# Patient Record
Sex: Male | Born: 1950 | Race: White | Hispanic: No | Marital: Married | Smoking: Never smoker
Health system: Southern US, Community
[De-identification: ages and names within clinical notes are randomized; demographics above are authoritative.]

## PROBLEM LIST (undated history)

## (undated) DIAGNOSIS — M1990 Unspecified osteoarthritis, unspecified site: Secondary | ICD-10-CM

## (undated) DIAGNOSIS — F419 Anxiety disorder, unspecified: Secondary | ICD-10-CM

## (undated) DIAGNOSIS — Z8673 Personal history of transient ischemic attack (TIA), and cerebral infarction without residual deficits: Secondary | ICD-10-CM

## (undated) DIAGNOSIS — I4891 Unspecified atrial fibrillation: Secondary | ICD-10-CM

## (undated) DIAGNOSIS — Q211 Atrial septal defect: Secondary | ICD-10-CM

## (undated) DIAGNOSIS — G4730 Sleep apnea, unspecified: Secondary | ICD-10-CM

## (undated) DIAGNOSIS — R51 Headache: Secondary | ICD-10-CM

## (undated) DIAGNOSIS — G2581 Restless legs syndrome: Secondary | ICD-10-CM

## (undated) DIAGNOSIS — S065XAA Traumatic subdural hemorrhage with loss of consciousness status unknown, initial encounter: Secondary | ICD-10-CM

## (undated) DIAGNOSIS — Z87442 Personal history of urinary calculi: Secondary | ICD-10-CM

## (undated) DIAGNOSIS — R519 Headache, unspecified: Secondary | ICD-10-CM

## (undated) DIAGNOSIS — S060X9A Concussion with loss of consciousness of unspecified duration, initial encounter: Secondary | ICD-10-CM

## (undated) DIAGNOSIS — G459 Transient cerebral ischemic attack, unspecified: Secondary | ICD-10-CM

## (undated) DIAGNOSIS — G43909 Migraine, unspecified, not intractable, without status migrainosus: Secondary | ICD-10-CM

## (undated) DIAGNOSIS — K219 Gastro-esophageal reflux disease without esophagitis: Secondary | ICD-10-CM

## (undated) DIAGNOSIS — Q2112 Patent foramen ovale: Secondary | ICD-10-CM

## (undated) DIAGNOSIS — I499 Cardiac arrhythmia, unspecified: Secondary | ICD-10-CM

## (undated) HISTORY — DX: Unspecified atrial fibrillation: I48.91

## (undated) HISTORY — DX: Restless legs syndrome: G25.81

## (undated) HISTORY — DX: Migraine, unspecified, not intractable, without status migrainosus: G43.909

## (undated) HISTORY — DX: Concussion with loss of consciousness of unspecified duration, initial encounter: S06.0X9A

## (undated) HISTORY — PX: CARDIAC CATHETERIZATION: SHX172

## (undated) HISTORY — PX: TONSILLECTOMY: SUR1361

## (undated) HISTORY — PX: COLONOSCOPY: SHX174

---

## 1998-12-14 ENCOUNTER — Inpatient Hospital Stay (HOSPITAL_COMMUNITY): Admission: EM | Admit: 1998-12-14 | Discharge: 1998-12-17 | Payer: Self-pay | Admitting: Emergency Medicine

## 1998-12-14 ENCOUNTER — Encounter: Payer: Self-pay | Admitting: Neurology

## 1998-12-14 ENCOUNTER — Encounter: Payer: Self-pay | Admitting: Emergency Medicine

## 2003-06-07 ENCOUNTER — Encounter: Admission: RE | Admit: 2003-06-07 | Discharge: 2003-06-07 | Payer: Self-pay | Admitting: Family Medicine

## 2006-01-27 HISTORY — PX: FOOT ARTHROTOMY: SUR104

## 2006-07-21 ENCOUNTER — Ambulatory Visit (HOSPITAL_BASED_OUTPATIENT_CLINIC_OR_DEPARTMENT_OTHER): Admission: RE | Admit: 2006-07-21 | Discharge: 2006-07-21 | Payer: Self-pay | Admitting: Orthopedic Surgery

## 2006-12-04 ENCOUNTER — Inpatient Hospital Stay (HOSPITAL_COMMUNITY): Admission: AD | Admit: 2006-12-04 | Discharge: 2006-12-07 | Payer: Self-pay | Admitting: Cardiology

## 2007-01-28 HISTORY — PX: CARDIAC ELECTROPHYSIOLOGY STUDY AND ABLATION: SHX1294

## 2007-09-30 ENCOUNTER — Encounter: Admission: RE | Admit: 2007-09-30 | Discharge: 2007-09-30 | Payer: Self-pay | Admitting: Orthopedic Surgery

## 2008-01-05 ENCOUNTER — Encounter: Admission: RE | Admit: 2008-01-05 | Discharge: 2008-01-05 | Payer: Self-pay | Admitting: Orthopedic Surgery

## 2009-12-06 ENCOUNTER — Encounter
Admission: RE | Admit: 2009-12-06 | Discharge: 2010-01-17 | Payer: Self-pay | Source: Home / Self Care | Attending: Family Medicine | Admitting: Family Medicine

## 2010-01-27 HISTORY — PX: SHOULDER SURGERY: SHX246

## 2010-06-11 NOTE — H&P (Signed)
NAME:  Gregg Guerrero, Gregg Guerrero NO.:  [id number]   MEDICAL RECORD NO.:  [id number]          PATIENT TYPE:  INP   LOCATION:  3711                         FACILITY:  [facility]   PHYSICIAN:  Georga Hacking, M.D.DATE OF BIRTH:  1950/03/22   DATE OF ADMISSION:  12/04/2006  DATE OF DISCHARGE:                              HISTORY & PHYSICAL   REASON FOR ADMISSION:  To initiate medications for atrial fibrillation.   HISTORY:  The patient is a 60 year old male who has a somewhat complex  history.  He has a history of some chest discomfort in the past and had  a catheterization done in 1998 that showed no evidence of  atherosclerosis.  One week later, he had a TIA and was evaluated by the  neurologist.  In 2000, he had a TIA that was manifested mainly by  dysarthria and had a workup including a cerebral angiogram as well as  TEE that showed a patent foramen ovale.  He has been treated since that  time with aspirin and Plavix without recurrence.  In September of last  year, he developed increasing cardiac awareness where he complained of a  feeling of pressure in his head and felt as if his head was full and the  feeling would spread over his body.  He complained of episodic racing of  his heart beat and a general sensation of cardiac awareness with  pounding pulse, worse if he turned over on his left side.  He had been  able to exercise and had no exercise-induced symptoms.  He wore a  cardiac event monitor in January of this year with 34 transmissions.  He  was in sinus rhythm on most, with 1 atrial run of 7 beats and atrial  fibrillation noted on 1 day.  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  He also had asymmetric septal hypertrophy with normal left ventricular  function and Doppler evidence of decreased LV function in February of  this year.  He presented to the office complaining again of palpitations  and was quite anxious.  A previous exercise treadmill was normal.  He  continued under significant situational stress.  He was seen in August,  at which time he had recurrent palpitations and dizziness.  We had  recommended him having an implantable loop monitor, but he was not  willing to have this done, but instead wore another event monitor that  was done in August and showed 40 episodes with frequent PACs and  sporadic bursts of atrial fibrillation and atrial flutter seen on  multiple days during the monitoring.Marland Kitchen  He was seen in September and was  placed on treatment with extended-release metoprolol 25 mg b.i.d. and  Rythmol SR 225 mg b.i.d.  He was also given an appointment see Dr. Clydie Braun.  He had excessive sleepiness, fatigue and lack of energy and  felt that his head was feeling full.  Metoprolol was discontinued and he  continued to complain of excessive sleepiness.  He was quite anxious  about his overall condition.  He was changed to flecainide 50 mg b.i.d.  and had seen Dr. Clydie Braun, who had recommended Coumadin therapy  and also talked about having a catheter ablation for the atrial  fibrillation.  He was seen in the office earlier this week complaining  of a feeling of head fullness, feeling bad like he was drunk with no  energy and pressure in his head and face.  At that point, flecainide was  discontinued and he was given a p.r.n. prescription for Cardizem to use  and is admitted to this time to initiate therapy with Tikosyn.   PAST MEDICAL HISTORY:  Remarkable for TIAs previously and the previous  patent foramen ovale.  He has no previous history of hypertension or  diabetes and no definite hyperlipidemia.   PREVIOUS SURGERIES:  Tonsillectomy.   ALLERGIES:  None.   CURRENT MEDICATIONS:  1. Ambien CR 12.5 nightly.  2. Aspirin 81 mg daily.  3. Fish oil daily.  4. Multivitamins daily.  5. Plavix 75 mg daily.   FAMILY HISTORY:  His father died of a  history of heart failure to age  44.  Mother died of a stroke at age 44.  Brother died of brain cancer at  age 82.  Another brother died of a history of heart failure and lung  cancer at age 38.  A sister died of pneumonia.  One brother is 57 and is  alive and well.   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  He attends [employer].  He and his wife are married.  He has 1 son who was adopted and has been a significant source of  situational stress.  He has a remote history of smoking.   REVIEW OF SYSTEMS:  Anxiety has been a significant problem for him and  he is quite stressed.  He has complained of significant malaise and  fatigue.  He has no skin problems.  He has diplopia, glaucoma or visual  field, no eye, ear, noxious stimuli or throat problems.  Normally, he  has no dyspnea, cough or wheezing.  He has no ulcers, hematochezia or GI  bleeding.  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  He has a history of migraine headaches and has a  history of a TIA previously and has seen the neurologist.  He has been  under significant situational stress and struggles with anxiety and  depression.   EXAMINATION:  GENERAL:  He is a pleasant, middle-aged male in no acute  distress, who appears anxious.  VITAL SIGNS:  Blood pressure was 110/60.  Weight was 181.  Pulse was 80.  Skin was warm and dry.  ENT:  EOMI.  PERLA.  C&S clear.  Fundi not examined.  Pharynx negative.  NECK:  Supple without masses, JVD, thyromegaly or bruits.  LUNGS:  Clear to A&P.  CARDIAC:  Normal S1 and S2, no S3 or murmur.  ABDOMEN:  Soft and nontender.  EXTREMITIES:  Femoral and distal pulses are 2+.   TWELVE-LEAD EKG:  Within normal limits.  Left axis deviation is noted.   IMPRESSION:  1. Paroxysmal atrial fibrillation.  2. Intolerance to flecainide and Rythmol and metoprolol.  3. History of transient ischemic attack and history  of migraine      headaches.  4. History of malaise and fatigue, probably due to medications.   RECOMMENDATIONS:  The patient has significant paroxysmal atrial  fibrillation.  He is considering having a catheter ablation and has been  intolerant to antiarrhythmic medicines already.  He was initiated for  inpatient monitoring of Tikosyn.  We previously have discussed with him  recommendations for Coumadin therapy with  paroxysmal atrial fibrillation and a previous history of TIAs, but he is  hesitant to do this because of concerns about the risks of being on  Coumadin.  We will initiate therapy with Tikosyn, continue aspirin and  Plavix and he will be monitored closely.      Georga Hacking, M.D.  Electronically Signed     WST/MEDQ  D:  12/04/2006  T:  12/05/2006  Job:  [id number]   cc:   Sigmund Hazel, M.D.

## 2010-06-11 NOTE — Op Note (Signed)
NAME:  Gregg Guerrero, Gregg Guerrero             ACCOUNT NO.:  [id number]   MEDICAL RECORD NO.:  [id number]          PATIENT TYPE:  AMB   LOCATION:  DSC                          FACILITY:  [facility]   PHYSICIAN:  Leonides Grills, M.D.     DATE OF BIRTH:  1950-12-19   DATE OF PROCEDURE:  07/21/2006  DATE OF DISCHARGE:                               OPERATIVE REPORT   PREOPERATIVE DIAGNOSIS:  Right hallux rigidus.   POSTOPERATIVE DIAGNOSIS:  Right hallux rigidus.   OPERATION:  Right great toe cheilectomy.   ANESTHESIA:  General with block.   SURGEON:  Leonides Grills, MD   ASSISTANT:  Evlyn Kanner, P.A.   ESTIMATED BLOOD LOSS:  Minimal.   TOURNIQUET TIME:  Approximately half hour.   COMPLICATIONS:  None.   DISPOSITION:  Stable to PR.   INDICATIONS:  60 year old male who has had longstanding right dorsal  great toe pain that is interfering with his life to the point that he  can not do what he wants to do.  He has consented to procedure.  All  risks infection or vessel injury, persistent worse pain, prolonged  recovery, stiffness, arthritis and possible fusion versus  hemiarthroplasty were all explained.  Questions were encouraged and  answered.   OPERATION:  The patient brought to the operating room, placed in supine  position after adequate general tube anesthesia was administered with  block as well as Ancef 1 gram IV piggyback.  The right lower extremity  was prepped and draped in sterile manner over a proximally placed thigh  tourniquet.  Limb was gravity exsanguinated.  Tourniquet elevated to 290  mmHg, longitudinal incision just medial to the EHL tendon was then made.  Dissection was carried down through skin.  Hemostasis was obtained.  A  longitudinal capsulotomy was then made in the right great toe MTP joint  medial to the EHL tendon.  EHL tendon was not violated.  Once the  capsulotomy was made, this capsule was then elevated both medially and  laterally.  This exposed the large  osteophyte dorsally.  This was then  removed with the sagittal saw.  Both the medial lateral gutters were  also debrided as well with a rongeur.  There was a loose body in the  dorsolateral aspect that was removed as well that was the size of a pea.  The spur on the base of the proximal phalanx was also removed with  rongeur.  The area and joint were copiously irrigated with normal  saline.  About 2/3 of the joint surface had remaining cartilage, another  third mostly laterally had complete loss of cartilage.  We then placed  in the McGlamry elevator to elevate plantar to improve the range of  motion.  Range of motion of the tendon was excellent, 45-50 degrees of  dorsiflexion and about 20 degrees of plantar flexion.  Once again the  area was copiously irrigated normal saline.  A bone wax applied to all  exposed bony surfaces.  Tourniquet deflated.  Hemostasis was obtained.  Capsule was closed with 3-0 Vicryl stitch.  Area was copiously irrigated  once again with  normal saline.  Subcu was closed 3-0 Vicryl.  Skin was  closed 4-0 nylon.  Sterile dressing was applied.  Hard sole shoe was  applied.  Patient stable to PR.   POSTOP COURSE:  The patient follow-up in 2 weeks.  At that time remove  the dressing as well as suture.  He can go into a normal shoe at that  time. Skin mobilization to start at that time as well and active-active,  passive range of motion excise great toe MTP joint is then to proceed.  Ice and elevation are encouraged.      Leonides Grills, M.D.  Electronically Signed     PB/MEDQ  D:  07/21/2006  T:  07/21/2006  Job:  [id number]   cc:   Sigmund Hazel, M.D.  Deanna Artis. Sharene Skeans, M.D.

## 2010-06-11 NOTE — Discharge Summary (Signed)
NAME:  Gregg Guerrero, Gregg Guerrero NO.:  [id number]   MEDICAL RECORD NO.:  [id number]           PATIENT TYPE:   LOCATION:                                 FACILITY:   PHYSICIAN:  Georga Hacking, M.D.DATE OF BIRTH:  [birth year]   DATE OF ADMISSION:  11/03/2006  DATE OF DISCHARGE:  11/06/2006                               DISCHARGE SUMMARY   FINAL DIAGNOSES:  1. Paroxysmal atrial fibrillation, a Tikosyn successfully initiated      this admission.  2. History of premature atrial contractions.  3. Head fullness of uncertain etiology, not cardiac related.  4. Anxiety.  5. Previous patent foraminal ovale.   HISTORY:  This 60 year old male has had a remote history of TIAs.  He  has also had increasing problems with anxiety, but this year has had  significant symptomatic paroxysmal atrial fibrillation that has been  documented.  He was tried initially on Rythmol and metoprolol that  caused excessive malaise and sleepiness.  He was later changed to  flecainide but he also complained of feeling fatigued and sleepy on  this.  He is being considered to have an ablation for atrial  fibrillation, but this to be several months off, and he is still  undecided about it.  He was concerned about side effects from the other  medication, and was recommended to initiate Tikosyn therapy to see if  this would help symptoms.  Please see the previously dictated history  and physical for remainder of the details.   HOSPITAL COURSE:  Laboratory data shows a sodium of 138, potassium 4.2,  chloride 107, CO2 25, BUN 24, creatinine 1.34, glucose 91.  CBC was  normal PT and PTT were normal.  Liver enzymes were normal.  TSH was  0.908.  Magnesium level was 2.3.  EKG was normal with a QTc of 0.41.   The patient was admitted to initiate Tikosyn therapy under monitoring.  He tolerated this well without any QT prolongation.  He was given  instruction and patient education regarding Tikosyn.  He continued  to  have symptoms of had fullness, and we discussed this, and it was felt  that these were not related is he was not having any arrhythmia at the  present time of the symptoms.  He is discharged at this time in improved  condition on:  1. Aspirin 325 mg daily.  2. Plavix 75 mg daily.  3. Tikosyn 250 mg q.12 hours.  4. Fish oil 1 g twice a day.   He was given instructions to avoid cimetidine, Septra and other sulfa-  related medications, agents to treat fungus as well as other medicines  to potentially avoid.  He is to follow up with Dr. Clydie Braun at  [facility] for consideration of whether he wants to  have an ablation or not.  He may wear a continuous loop telemetry  monitor to assess efficacy and suppression of atrial fibrillation.  He  is to call if there are further problems.      Georga Hacking, M.D.  Electronically Signed     WST/MEDQ  D:  12/07/2006  T:  12/07/2006  Job:  [id number]   cc:   Deanna Artis. Sharene Skeans, M.D.

## 2010-06-14 NOTE — H&P (Signed)
[place]  Patient:    Gregg Guerrero                     MRN: [id number] Adm. Date:  [id number] Attending:  Glean Hess D                         History and Physical  CHIEF COMPLAINT:  Unable to speak and right arm weakness.  HISTORY OF PRESENT ILLNESS:  The patient is a 60 year old man who woke up early  this morning in his usual state of health.  Around 6:15 in the morning right before breakfast he developed a sudden onset of right upper extremity weakness, incoordination and he was unable to talk for about 20 minutes.  The patient was  transported to the emergency room by his wife in a private vehicle.  By the time of arrival he showed significant improvement of his symptoms and he was back to his baseline neurological status.  This patient had a prior history of TIA in 1998 hen he underwent an extensive workup and testing under evaluation by Dr. Ellison Carwin from [facility] which included MRI/MRA of the brain, hypercoaguable state labs, and cardiac evaluation which consisted of a coronary  angiogram prior to his admission in 1998 by cardiology.  All of his work ______  and testing was completely normal.  He has been kept on aspirin 325 mg p.o. q.d. for secondary stroke prevention and since then he has been doing very well.  PAST MEDICAL HISTORY: 1. TIA - unknown etiology. 2. Low HDL. 3. Periodic limb movement syndrome. 4. Mild central obstructive apnea. 5. Migraine headaches.  MEDICATIONS: 1. Zoloft 50 mg q.d. 2. Prevacid 30 mg p.r.n. 3. Aspirin 325 mg q.d.  ALLERGIES:  No drug allergies.  SOCIAL HISTORY:  LIves with his wife, nonsmoker, nondrinker.  PRIMARY CARE PHYSICIAN:  Dellis Anes. Idell Pickles, M.D. at [facility] [phone].  REVIEW OF SYSTEMS:  As in prior history and present illness.  PHYSICAL EXAMINATION:  VITAL SIGNS:  Blood pressure 126/68, pulse 78, respirations 15,  temperature 98.6.  GENERAL:   Well-developed man laying in the stretcher in no acute distress. Head normocephalic, atraumatic.  NECK:  Supple, no bruits.  LUNGS:  Clear bilaterally.  HEART:  Sounds are regular rhythm, no murmurs.  ABDOMEN: Soft, bowel sounds present.  No visceromegaly, no tenderness.  EXTREMITIES:  Positive pulses bilaterally.  No cyanosis or edema.  NEUROLOGIC:  Awake, alert, oriented.  Speech fluent.  Memory and language normal. Cranial nerves II-XII:  Pupils equal, round and reactive to light and accommodation. Extraocular cephalic movement intact.  Face:  Symmetric. Tongue: Midline.  Palate elevates symmetrically.  Visual fields:  Full to confrontation. Hearing:  Intact.  Motor examination:  Strength full bilaterally 5/5 upper and lower extremities.  Deep tendon reflexes +2 throughout.  Plantar is downgoing.  Coordination and sensory normal.  IMPRESSION:  Left hemispheric transient ischemic attack, unknown etiology.  PLAN AND RECOMMENDATIONS:  The diagnosis, condition, and further intervention were discussed at length with the patient and his wife at the bedside.  We would like to admit the patient for further workup and testing of transient ischemic attack of unknown etiology at this time.  Further testing that is warranted in order to prevent a potential stroke in the future would include a transesophageal echocardiogram for evaluation of potential embolic sources including PFO, atrial septal aneurysm, left atrial appendage clot  and aortic arch abnormalities.  We would also like to obtain an activated protein C resistant testing, homocystine  level, and a cerebral catheterization and angiography to evaluate for vasculitis. The patient will remain on heparin until his workup is completed.  Maintain PTTs between 60 and 80 seconds.  Dr. Toya Smothers office at [facility] was notified of the patients admission.  Thank you for allowing me  to participate in the care of this patient. DD:  12/14/98 TD:  12/14/98 Job: [id number]

## 2010-11-05 LAB — COMPREHENSIVE METABOLIC PANEL
ALT: 25
AST: 21
Albumin: 3.7
BUN: 24 — ABNORMAL HIGH
CO2: 25
Creatinine, Ser: 1.34
Glucose, Bld: 91
Total Bilirubin: 0.8
Total Protein: 6.2

## 2010-11-05 LAB — CBC
Hemoglobin: 14.8
MCV: 88

## 2010-11-05 LAB — MAGNESIUM: Magnesium: 2.3

## 2010-11-05 LAB — DIFFERENTIAL
Eosinophils Absolute: 0.1
Lymphs Abs: 1.7
Neutro Abs: 4
Neutrophils Relative %: 63

## 2010-11-05 LAB — PROTIME-INR: Prothrombin Time: 13.2

## 2013-01-31 ENCOUNTER — Encounter (HOSPITAL_BASED_OUTPATIENT_CLINIC_OR_DEPARTMENT_OTHER): Payer: Self-pay | Admitting: *Deleted

## 2013-01-31 NOTE — Progress Notes (Signed)
No labs needed

## 2013-02-02 ENCOUNTER — Other Ambulatory Visit: Payer: Self-pay | Admitting: Orthopedic Surgery

## 2013-02-02 ENCOUNTER — Encounter (HOSPITAL_BASED_OUTPATIENT_CLINIC_OR_DEPARTMENT_OTHER): Payer: Self-pay | Admitting: *Deleted

## 2013-02-03 ENCOUNTER — Encounter (HOSPITAL_BASED_OUTPATIENT_CLINIC_OR_DEPARTMENT_OTHER): Payer: 59 | Admitting: Certified Registered"

## 2013-02-03 ENCOUNTER — Encounter (HOSPITAL_BASED_OUTPATIENT_CLINIC_OR_DEPARTMENT_OTHER)
Admission: RE | Disposition: A | Discharge status: Home / Self Care | Payer: Self-pay | Source: Ambulatory Visit | Attending: Orthopedic Surgery

## 2013-02-03 ENCOUNTER — Ambulatory Visit (HOSPITAL_BASED_OUTPATIENT_CLINIC_OR_DEPARTMENT_OTHER)
Admission: RE | Admit: 2013-02-03 | Discharge: 2013-02-03 | Disposition: A | Discharge status: Home / Self Care | Payer: 59 | Source: Ambulatory Visit | Attending: Orthopedic Surgery | Admitting: Orthopedic Surgery

## 2013-02-03 ENCOUNTER — Ambulatory Visit (HOSPITAL_BASED_OUTPATIENT_CLINIC_OR_DEPARTMENT_OTHER): Payer: 59 | Admitting: Certified Registered"

## 2013-02-03 ENCOUNTER — Encounter (HOSPITAL_BASED_OUTPATIENT_CLINIC_OR_DEPARTMENT_OTHER): Payer: Self-pay | Admitting: *Deleted

## 2013-02-03 DIAGNOSIS — M2020 Hallux rigidus, unspecified foot: Secondary | ICD-10-CM | POA: Insufficient documentation

## 2013-02-03 DIAGNOSIS — Z7982 Long term (current) use of aspirin: Secondary | ICD-10-CM | POA: Insufficient documentation

## 2013-02-03 DIAGNOSIS — M2022 Hallux rigidus, left foot: Secondary | ICD-10-CM

## 2013-02-03 DIAGNOSIS — M2010 Hallux valgus (acquired), unspecified foot: Secondary | ICD-10-CM | POA: Insufficient documentation

## 2013-02-03 HISTORY — DX: Sleep apnea, unspecified: G47.30

## 2013-02-03 HISTORY — PX: CHEILECTOMY: SHX1336

## 2013-02-03 HISTORY — DX: Anxiety disorder, unspecified: F41.9

## 2013-02-03 HISTORY — DX: Transient cerebral ischemic attack, unspecified: G45.9

## 2013-02-03 HISTORY — DX: Cardiac arrhythmia, unspecified: I49.9

## 2013-02-03 LAB — POCT HEMOGLOBIN-HEMACUE: HEMOGLOBIN: 12.2 g/dL — AB (ref 13.0–17.0)

## 2013-02-03 SURGERY — CHEILECTOMY
Anesthesia: General | Site: Foot | Laterality: Left

## 2013-02-03 MED ORDER — ONDANSETRON HCL 4 MG/2ML IJ SOLN
INTRAMUSCULAR | Status: DC | PRN
  Administered 2013-02-03: 4 mg via INTRAVENOUS

## 2013-02-03 MED ORDER — MIDAZOLAM HCL 2 MG/2ML IJ SOLN
INTRAMUSCULAR | Status: AC
  Filled 2013-02-03: qty 2

## 2013-02-03 MED ORDER — 0.9 % SODIUM CHLORIDE (POUR BTL) OPTIME
TOPICAL | Status: DC | PRN
  Administered 2013-02-03: 200 mL

## 2013-02-03 MED ORDER — CHLORHEXIDINE GLUCONATE 4 % EX LIQD
60.0000 mL | Freq: Once | CUTANEOUS | Status: AC
  Administered 2013-02-03: 4 via TOPICAL

## 2013-02-03 MED ORDER — ONDANSETRON HCL 4 MG/2ML IJ SOLN: 4.0000 mg | Freq: Once | INTRAMUSCULAR | Status: DC | PRN

## 2013-02-03 MED ORDER — HYDROMORPHONE HCL PF 1 MG/ML IJ SOLN: 0.2500 mg | INTRAMUSCULAR | Status: DC | PRN

## 2013-02-03 MED ORDER — FENTANYL CITRATE 0.05 MG/ML IJ SOLN
INTRAMUSCULAR | Status: AC
  Filled 2013-02-03: qty 2

## 2013-02-03 MED ORDER — BACITRACIN ZINC 500 UNIT/GM EX OINT
TOPICAL_OINTMENT | CUTANEOUS | Status: AC
  Filled 2013-02-03: qty 28.35

## 2013-02-03 MED ORDER — OXYCODONE HCL 5 MG/5ML PO SOLN: 5.0000 mg | Freq: Once | ORAL | Status: DC | PRN

## 2013-02-03 MED ORDER — OXYCODONE HCL 5 MG PO TABS
5.0000 mg | ORAL_TABLET | Freq: Once | ORAL | Status: DC | PRN
Start: 2013-02-03 — End: 2013-02-03

## 2013-02-03 MED ORDER — CEFAZOLIN SODIUM-DEXTROSE 2-3 GM-% IV SOLR
2.0000 g | INTRAVENOUS | Status: AC
  Administered 2013-02-03: 2 g via INTRAVENOUS

## 2013-02-03 MED ORDER — LACTATED RINGERS IV SOLN
INTRAVENOUS | Status: DC
  Administered 2013-02-03 (×2): via INTRAVENOUS

## 2013-02-03 MED ORDER — BACITRACIN ZINC 500 UNIT/GM EX OINT
TOPICAL_OINTMENT | CUTANEOUS | Status: DC | PRN
  Administered 2013-02-03: 1 via TOPICAL

## 2013-02-03 MED ORDER — OXYCODONE HCL 5 MG PO TABS: 5.0000 mg | ORAL_TABLET | ORAL | Status: DC | PRN

## 2013-02-03 MED ORDER — CEFAZOLIN SODIUM 1-5 GM-% IV SOLN
INTRAVENOUS | Status: AC
  Filled 2013-02-03: qty 100

## 2013-02-03 MED ORDER — BUPIVACAINE-EPINEPHRINE PF 0.5-1:200000 % IJ SOLN
INTRAMUSCULAR | Status: AC
  Filled 2013-02-03: qty 30

## 2013-02-03 MED ORDER — FENTANYL CITRATE 0.05 MG/ML IJ SOLN
INTRAMUSCULAR | Status: AC
  Filled 2013-02-03: qty 6

## 2013-02-03 MED ORDER — SODIUM CHLORIDE 0.9 % IV SOLN: INTRAVENOUS | Status: DC

## 2013-02-03 MED ORDER — DEXAMETHASONE SODIUM PHOSPHATE 10 MG/ML IJ SOLN
INTRAMUSCULAR | Status: DC | PRN
  Administered 2013-02-03: 10 mg via INTRAVENOUS

## 2013-02-03 MED ORDER — FENTANYL CITRATE 0.05 MG/ML IJ SOLN
50.0000 ug | INTRAMUSCULAR | Status: DC | PRN
  Administered 2013-02-03: 100 ug via INTRAVENOUS

## 2013-02-03 MED ORDER — MIDAZOLAM HCL 2 MG/2ML IJ SOLN
1.0000 mg | INTRAMUSCULAR | Status: DC | PRN
  Administered 2013-02-03: 2 mg via INTRAVENOUS

## 2013-02-03 MED ORDER — LIDOCAINE HCL (CARDIAC) 20 MG/ML IV SOLN
INTRAVENOUS | Status: DC | PRN
  Administered 2013-02-03: 30 mg via INTRAVENOUS

## 2013-02-03 MED ORDER — PROPOFOL 10 MG/ML IV BOLUS
INTRAVENOUS | Status: DC | PRN
  Administered 2013-02-03: 200 mg via INTRAVENOUS

## 2013-02-03 MED ORDER — BUPIVACAINE-EPINEPHRINE PF 0.5-1:200000 % IJ SOLN
INTRAMUSCULAR | Status: DC | PRN
  Administered 2013-02-03: 2 mL via PERINEURAL

## 2013-02-03 MED ORDER — OXYCODONE HCL 5 MG PO TABS: 5.0000 mg | ORAL_TABLET | Freq: Once | ORAL | Status: DC | PRN

## 2013-02-03 SURGICAL SUPPLY — 69 items
BANDAGE ESMARK 6X9 LF (GAUZE/BANDAGES/DRESSINGS) IMPLANT
BLADE AVERAGE 25X9 (BLADE) ×2 IMPLANT
BLADE MINI RND TIP GREEN BEAV (BLADE) IMPLANT
BLADE OSC/SAG .038X5.5 CUT EDG (BLADE) IMPLANT
BLADE SURG 15 STRL LF DISP TIS (BLADE) ×2 IMPLANT
BLADE SURG 15 STRL SS (BLADE) ×6
BNDG CMPR 9X4 STRL LF SNTH (GAUZE/BANDAGES/DRESSINGS)
BNDG CMPR 9X6 STRL LF SNTH (GAUZE/BANDAGES/DRESSINGS) ×1
BNDG COHESIVE 4X5 TAN STRL (GAUZE/BANDAGES/DRESSINGS) ×2 IMPLANT
BNDG CONFORM 2 STRL LF (GAUZE/BANDAGES/DRESSINGS) IMPLANT
BNDG CONFORM 3 STRL LF (GAUZE/BANDAGES/DRESSINGS) ×2 IMPLANT
BNDG ESMARK 4X9 LF (GAUZE/BANDAGES/DRESSINGS) IMPLANT
BNDG ESMARK 6X9 LF (GAUZE/BANDAGES/DRESSINGS) ×2
CHLORAPREP W/TINT 26ML (MISCELLANEOUS) ×2 IMPLANT
COVER TABLE BACK 60X90 (DRAPES) ×2 IMPLANT
CUFF TOURNIQUET SINGLE 34IN LL (TOURNIQUET CUFF) ×1 IMPLANT
DRAPE EXTREMITY T 121X128X90 (DRAPE) ×2 IMPLANT
DRAPE OEC MINIVIEW 54X84 (DRAPES) ×1 IMPLANT
DRAPE U-SHAPE 47X51 STRL (DRAPES) ×2 IMPLANT
DRSG EMULSION OIL 3X3 NADH (GAUZE/BANDAGES/DRESSINGS) ×2 IMPLANT
ELECT REM PT RETURN 9FT ADLT (ELECTROSURGICAL) ×2
ELECTRODE REM PT RTRN 9FT ADLT (ELECTROSURGICAL) ×1 IMPLANT
GLOVE BIO SURGEON STRL SZ8 (GLOVE) ×2 IMPLANT
GLOVE BIOGEL PI IND STRL 7.0 (GLOVE) IMPLANT
GLOVE BIOGEL PI IND STRL 7.5 (GLOVE) ×1 IMPLANT
GLOVE BIOGEL PI IND STRL 8 (GLOVE) ×1 IMPLANT
GLOVE BIOGEL PI INDICATOR 7.0 (GLOVE) ×1
GLOVE BIOGEL PI INDICATOR 7.5 (GLOVE) ×1
GLOVE BIOGEL PI INDICATOR 8 (GLOVE) ×1
GLOVE ECLIPSE 6.5 STRL STRAW (GLOVE) ×1 IMPLANT
GLOVE ECLIPSE 7.0 STRL STRAW (GLOVE) ×1 IMPLANT
GLOVE EXAM NITRILE MD LF STRL (GLOVE) ×1 IMPLANT
GOWN STRL REUS W/ TWL LRG LVL3 (GOWN DISPOSABLE) ×2 IMPLANT
GOWN STRL REUS W/ TWL XL LVL3 (GOWN DISPOSABLE) ×1 IMPLANT
GOWN STRL REUS W/TWL LRG LVL3 (GOWN DISPOSABLE) ×2
GOWN STRL REUS W/TWL XL LVL3 (GOWN DISPOSABLE) ×2
NDL HYPO 25X1 1.5 SAFETY (NEEDLE) IMPLANT
NEEDLE HYPO 25X1 1.5 SAFETY (NEEDLE) IMPLANT
NS IRRIG 1000ML POUR BTL (IV SOLUTION) ×2 IMPLANT
PACK BASIN DAY SURGERY FS (CUSTOM PROCEDURE TRAY) ×2 IMPLANT
PAD ABD 8X10 STRL (GAUZE/BANDAGES/DRESSINGS) ×2 IMPLANT
PAD CAST 4YDX4 CTTN HI CHSV (CAST SUPPLIES) ×1 IMPLANT
PADDING CAST ABS 4INX4YD NS (CAST SUPPLIES)
PADDING CAST ABS COTTON 4X4 ST (CAST SUPPLIES) ×1 IMPLANT
PADDING CAST COTTON 4X4 STRL (CAST SUPPLIES) ×2
PENCIL BUTTON HOLSTER BLD 10FT (ELECTRODE) ×2 IMPLANT
SANITIZER HAND PURELL 535ML FO (MISCELLANEOUS) ×2 IMPLANT
SHEET MEDIUM DRAPE 40X70 STRL (DRAPES) ×1 IMPLANT
SLEEVE SCD COMPRESS KNEE MED (MISCELLANEOUS) ×2 IMPLANT
SPONGE GAUZE 4X4 12PLY (GAUZE/BANDAGES/DRESSINGS) ×3 IMPLANT
SPONGE LAP 18X18 X RAY DECT (DISPOSABLE) ×2 IMPLANT
STOCKINETTE 6  STRL (DRAPES) ×1
STOCKINETTE 6 STRL (DRAPES) ×1 IMPLANT
SUCTION FRAZIER TIP 10 FR DISP (SUCTIONS) IMPLANT
SUT ETHILON 3 0 PS 1 (SUTURE) ×2 IMPLANT
SUT ETHILON 4 0 PS 2 18 (SUTURE) IMPLANT
SUT MNCRL AB 3-0 PS2 18 (SUTURE) ×2 IMPLANT
SUT MNCRL AB 4-0 PS2 18 (SUTURE) IMPLANT
SUT VIC AB 2-0 SH 27 (SUTURE) ×2
SUT VIC AB 2-0 SH 27XBRD (SUTURE) ×1 IMPLANT
SUT VIC AB 3-0 PS1 18 (SUTURE)
SUT VIC AB 3-0 PS1 18XBRD (SUTURE) IMPLANT
SYR BULB 3OZ (MISCELLANEOUS) ×2 IMPLANT
SYR CONTROL 10ML LL (SYRINGE) IMPLANT
TOWEL OR 17X24 6PK STRL BLUE (TOWEL DISPOSABLE) ×2 IMPLANT
TOWEL OR NON WOVEN STRL DISP B (DISPOSABLE) ×2 IMPLANT
TUBE CONNECTING 20X1/4 (TUBING) IMPLANT
UNDERPAD 30X30 INCONTINENT (UNDERPADS AND DIAPERS) ×2 IMPLANT
YANKAUER SUCT BULB TIP NO VENT (SUCTIONS) IMPLANT

## 2013-02-03 NOTE — Discharge Instructions (Signed)
Toni Arthurs, MD [facility]  Please read the following information regarding your care after surgery.  Medications  You only need a prescription for the narcotic pain medicine (ex. oxycodone, Percocet, Norco).  All of the other medicines listed below are available over the counter. X acetominophen (Tylenol) 650 mg every 4-6 hours as you need for minor pain X oxycodone as prescribed for moderate to severe pain   Narcotic pain medicine (ex. oxycodone, Percocet, Vicodin) will cause constipation.  To prevent this problem, take the following medicines while you are taking any pain medicine. X docusate sodium (Colace) 100 mg twice a day X senna (Senokot) 2 tablets twice a day  Weight Bearing X Bear weight when you are able on your operated leg or foot in the post-op shoe.  Cast / Splint / Dressing X Keep your dressing clean and dry.  Dont put anything (coat hanger, pencil, etc) down inside of it.  If it gets damp, use a hair dryer on the cool setting to dry it.  If it gets soaked, call the office to schedule an appointment for a change.  After your dressing, cast or splint is removed; you may shower, but do not soak or scrub the wound.  Allow the water to run over it, and then gently pat it dry.  Swelling It is normal for you to have swelling where you had surgery.  To reduce swelling and pain, keep your toes above your nose for at least 3 days after surgery.  It may be necessary to keep your foot or leg elevated for several weeks.  If it hurts, it should be elevated.  Follow Up Call my office at [phone] when you are discharged from the hospital or surgery center to schedule an appointment to be seen two weeks after surgery.  Call my office at [phone] if you develop a fever >101.5 F, nausea, vomiting, bleeding from the surgical site or severe pain.     Post Anesthesia Home Care Instructions  Activity: Get plenty of rest for the remainder of the day. A responsible  adult should stay with you for 24 hours following the procedure.  For the next 24 hours, DO NOT: -Drive a car -Advertising copywriter -Drink alcoholic beverages -Take any medication unless instructed by your physician -Make any legal decisions or sign important papers.  Meals: Start with liquid foods such as gelatin or soup. Progress to regular foods as tolerated. Avoid greasy, spicy, heavy foods. If nausea and/or vomiting occur, drink only clear liquids until the nausea and/or vomiting subsides. Call your physician if vomiting continues.  Special Instructions/Symptoms: Your throat may feel dry or sore from the anesthesia or the breathing tube placed in your throat during surgery. If this causes discomfort, gargle with warm salt water. The discomfort should disappear within 24 hours.   Regional Anesthesia Blocks  1. Numbness or the inability to move the "blocked" extremity may last from 3-48 hours after placement. The length of time depends on the medication injected and your individual response to the medication. If the numbness is not going away after 48 hours, call your surgeon.  2. The extremity that is blocked will need to be protected until the numbness is gone and the  Strength has returned. Because you cannot feel it, you will need to take extra care to avoid injury. Because it may be weak, you may have difficulty moving it or using it. You may not know what position it is in without looking at it while the  block is in effect.  3. For blocks in the legs and feet, returning to weight bearing and walking needs to be done carefully. You will need to wait until the numbness is entirely gone and the strength has returned. You should be able to move your leg and foot normally before you try and bear weight or walk. You will need someone to be with you when you first try to ensure you do not fall and possibly risk injury.  4. Bruising and tenderness at the needle site are common side effects and  will resolve in a few days.  5. Persistent numbness or new problems with movement should be communicated to the surgeon or the [facility] [phone])/ [facility] [phone]).

## 2013-02-03 NOTE — Anesthesia Postprocedure Evaluation (Signed)
  Anesthesia Post-op Note  Patient: Gregg Guerrero  Procedure(s) Performed: Procedure(s): LEFT HALLUX METATARSALPHALANGEAL JOINT CHEILECTOMY (Left)  Patient Location: PACU  Anesthesia Type:General  Level of Consciousness: awake, alert  and oriented  Airway and Oxygen Therapy: Patient Spontanous Breathing  Post-op Pain: none  Post-op Assessment: Post-op Vital signs reviewed  Post-op Vital Signs: Reviewed  Complications: No apparent anesthesia complications

## 2013-02-03 NOTE — Anesthesia Procedure Notes (Addendum)
Procedure Name: LMA Insertion Date/Time: 02/03/2013 10:34 AM Performed by: Viney Acocella Pre-anesthesia Checklist: Patient identified, Emergency Drugs available, Suction available and Patient being monitored Patient Re-evaluated:Patient Re-evaluated prior to inductionOxygen Delivery Method: Circle System Utilized Preoxygenation: Pre-oxygenation with 100% oxygen Intubation Type: IV induction Ventilation: Mask ventilation without difficulty LMA: LMA inserted LMA Size: 4.0 Number of attempts: 1 Airway Equipment and Method: bite block Placement Confirmation: positive ETCO2 Tube secured with: Tape Dental Injury: Teeth and Oropharynx as per pre-operative assessment    Right Ankle Block:  Sterile prep, 22g SB needle for ankle block by landmarks.  Neg asp, total anesthetic used of 20 ml 0.5% Marcaine with 1:200000 Epi. Tolerated well. Time: 1610-9604: 0935-0942.

## 2013-02-03 NOTE — H&P (Signed)
Gregg Guerrero is an 63 y.o. male.   Chief Complaint: left forefoot pain HPI: 63 y/o male with left hallux rigidus and painful dorsal bony prominence at the 1st metatarsal head.  He presents now for surgical correction.  Past Medical History  Diagnosis Date  . TIA (transient ischemic attack)   . Anxiety   . Sleep apnea     mild-no cpap needed  . Dysrhythmia     hx af    Past Surgical History  Procedure Laterality Date  . Cardiac electrophysiology study and ablation  2009    dr Donnie Ahotilley  . Tonsillectomy    . Colonoscopy    . Foot arthrotomy  2008    rightx2    History reviewed. No pertinent family history. Social History:  reports that he has never smoked. He does not have any smokeless tobacco history on file. He reports that he drinks alcohol. He reports that he does not use illicit drugs.  Allergies: No Known Allergies  Medications Prior to Admission  Medication Sig Dispense Refill  . aspirin 81 MG tablet Take 81 mg by mouth daily.      . clonazePAM (KLONOPIN) 1 MG tablet Take 1 mg by mouth at bedtime.      . clopidogrel (PLAVIX) 75 MG tablet Take 75 mg by mouth daily with breakfast.      . Multiple Vitamins-Minerals (MULTIVITAMIN WITH MINERALS) tablet Take 1 tablet by mouth daily.        Results for orders placed during the hospital encounter of 02/03/13 (from the past 48 hour(s))  POCT HEMOGLOBIN-HEMACUE     Status: Abnormal   Collection Time    02/03/13  9:19 AM      Result Value Range   Hemoglobin 12.2 (*) 13.0 - 17.0 g/dL   No results found.  ROS  No recent f/c/n/v/wt loss  Blood pressure 146/78, pulse 78, temperature 98.3 F (36.8 C), temperature source Oral, resp. rate 15, height 6\' 1"  (1.854 m), weight 89.359 kg (197 lb), SpO2 99.00%. Physical Exam  wn wd male in nad.  Alert and oriented x 4.  Mood and affect normal.  EOMI.  Resp unlabored.  L forefoot with dorsal bony prominence.  Decreased rom at the hallux MP joint.  Skin healthy and intact.  Sens to  LT intact dorsally and plantarly.  2+ dp and pt pulses.  Assessment/Plan L hallux rigidus - to OR for L hallux MPJ cheilectomy.  The risks and benefits of the alternative treatment options have been discussed in detail.  The patient wishes to proceed with surgery and specifically understands risks of bleeding, infection, nerve damage, blood clots, need for additional surgery, amputation and death.   Gregg Guerrero 02/03/2013, 10:08 AM

## 2013-02-03 NOTE — Anesthesia Preprocedure Evaluation (Addendum)
Anesthesia Evaluation  Patient identified by MRN, date of birth, ID band Patient awake    Reviewed: Allergy & Precautions, H&P , NPO status , Patient's Chart, lab work & pertinent test results  Airway Mallampati: I TM Distance: >3 FB Neck ROM: Full    Dental  (+) Teeth Intact and Dental Advisory Given   Pulmonary  breath sounds clear to auscultation        Cardiovascular + dysrhythmias Atrial Fibrillation Rhythm:Regular Rate:Normal     Neuro/Psych TIA   GI/Hepatic   Endo/Other    Renal/GU      Musculoskeletal   Abdominal   Peds  Hematology   Anesthesia Other Findings   Reproductive/Obstetrics                          Anesthesia Physical Anesthesia Plan  ASA: II  Anesthesia Plan: General   Post-op Pain Management:    Induction: Intravenous  Airway Management Planned: LMA  Additional Equipment:   Intra-op Plan:   Post-operative Plan: Extubation in OR  Informed Consent: I have reviewed the patients History and Physical, chart, labs and discussed the procedure including the risks, benefits and alternatives for the proposed anesthesia with the patient or authorized representative who has indicated his/her understanding and acceptance.   Dental advisory given  Plan Discussed with: CRNA, Anesthesiologist and Surgeon  Anesthesia Plan Comments:         Anesthesia Quick Evaluation

## 2013-02-03 NOTE — Progress Notes (Signed)
Assisted Dr. Crews with left, ankle block. Side rails up, monitors on throughout procedure. See vital signs in flow sheet. Tolerated Procedure well. 

## 2013-02-03 NOTE — Brief Op Note (Signed)
02/03/2013  11:11 AM  PATIENT:  Gregg Guerrero  63 y.o. male  PRE-OPERATIVE DIAGNOSIS:  LEFT HALLUX RIGIDUS  POST-OPERATIVE DIAGNOSIS:  LEFT HALLUX RIGIDUS, left 1st metatarsal bunion  Procedure(s): 1.  LEFT HALLUX METATARSALPHALANGEAL JOINT CHEILECTOMY 2.  Left 1st metatarsal bunionectomy  SURGEON:  Toni ArthursJohn Amarise Lillo, MD  ASSISTANT: n/a  ANESTHESIA:   General, regional  EBL:  minimal   TOURNIQUET:   Total Tourniquet Time Documented: Thigh (Right) - 17 minutes Total: Thigh (Right) - 17 minutes   COMPLICATIONS:  None apparent  DISPOSITION:  Extubated, awake and stable to recovery.  DICTATION ID:  [id number]

## 2013-02-03 NOTE — Transfer of Care (Signed)
Immediate Anesthesia Transfer of Care Note  Patient: Gregg Guerrero  Procedure(s) Performed: Procedure(s): LEFT HALLUX METATARSALPHALANGEAL JOINT CHEILECTOMY (Left)  Patient Location: PACU  Anesthesia Type:GA combined with regional for post-op pain  Level of Consciousness: awake and patient cooperative  Airway & Oxygen Therapy: Patient Spontanous Breathing and Patient connected to face mask oxygen  Post-op Assessment: Report given to PACU RN and Post -op Vital signs reviewed and stable  Post vital signs: Reviewed and stable  Complications: No apparent anesthesia complications

## 2013-02-04 ENCOUNTER — Encounter (HOSPITAL_BASED_OUTPATIENT_CLINIC_OR_DEPARTMENT_OTHER): Payer: Self-pay | Admitting: Orthopedic Surgery

## 2013-02-04 NOTE — Op Note (Signed)
NAME:  Carson MyrtleMERRITT, Zayn             ACCOUNT NO.:  [id number]  MEDICAL RECORD NO.:  [id number]  LOCATION:                                 FACILITY:  PHYSICIAN:  Toni ArthursJohn Lou Loewe, MD             DATE OF BIRTH:  DATE OF PROCEDURE:  02/03/2013 DATE OF DISCHARGE:                              OPERATIVE REPORT   PREOPERATIVE DIAGNOSES:  Left hallux rigidus, left hallux valgus.  POSTOPERATIVE DIAGNOSES:  Left hallux rigidus, left hallux valgus.  PROCEDURE: 1. Left hallux metatarsophalangeal joint condylectomy. 2. Left first metatarsal bunionectomy.  SURGEON:  Toni ArthursJohn Bianca Vester, MD  ANESTHESIA:  General, regional.  ESTIMATED BLOOD LOSS:  Minimal.  TOURNIQUET TIME:  17 minutes at 220 mmHg.  COMPLICATIONS:  None apparent.  DISPOSITION:  Extubated, awake and stable to recovery.  INDICATIONS FOR PROCEDURE:  The patient is a 63 year old male with past medical history significant for left hallux rigidus.  He has failed nonoperative treatment to date including activity modification, oral anti-inflammatories, and shoe wear modification.  He presents now for operative treatment of this condition.  He understands the risks and benefits, the alternative treatment options and elects surgical treatment.  He specifically understands risks of bleeding, infection, nerve damage, blood clots, need for additional surgery, continued pain, amputation and death.  PROCEDURE IN DETAIL:  After preoperative consent was obtained and the correct operative site was identified.  The patient was brought to the operating room and placed supine on the operating table.  General anesthesia was induced.  Preoperative antibiotics were administered. Surgical time-out was taken.  The left lower extremity was prepped and draped in standard sterile fashion with the tourniquet around the thigh. The extremity was exsanguinated and the tourniquet was inflated to 250 mmHg.  A longitudinal incision was then made over the hallux MP  joint. Sharp dissection was carried down through skin and subcutaneous tissue. The dorsal joint capsule was incised and elevated medially and laterally.  The collateral ligaments were released.  The patient was noted to have significant arthritis of the hallux MP joint, particularly over the dorsal third of the metatarsal head.  The cartilage in this area was completely absent.  There was a large dorsal osteophytes.  The hallux MP joint was mobilized and a McGlamry elevator was passed into the sesamoid articulation plantar to the head, releasing the plantar joint capsule.  This allowed dorsiflexion to almost 90 degrees and plantar flexion of about 60 degrees.  The wound was irrigated copiously. The medial eminence was noted to be quite hypertrophic as well  creating a bunion deformity.  The oscillating saw was then used to resect the dorsal osteophytes at the dorsal 25% of the metatarsal head, removing all of the exposed bone and leaving behind only intact cartilage.  The medial eminence was then resected in line with the metatarsal shaft using the oscillating saw.  Once the bunionectomy was complete, the cut edges were smoothed with a rongeur.  The joint was irrigated copiously. The dorsal joint capsule was then repaired over the bone with simple sutures of 2-0 Vicryl.  Subcutaneous tissue was approximated with inverted simple sutures of 3-0 Monocryl and a running 3-0  nylon was used to close the skin incision.  Sterile dressings were applied, followed by a compression wrap.  The tourniquet was released at approximately 17 minutes after application of the dressings.  The patient was awakened from anesthesia and transported to the recovery room in stable condition.  FOLLOWUP PLAN:  The patient will be weightbearing as tolerated in a flat postop shoe.  He will follow up with me in 2 weeks to begin range of motion.     Toni Arthurs, MD     JH/MEDQ  D:  02/03/2013  T:  02/04/2013   Job:  [id number]

## 2013-04-15 ENCOUNTER — Telehealth: Payer: Self-pay | Admitting: Nurse Practitioner

## 2013-04-15 MED ORDER — CLOPIDOGREL BISULFATE 75 MG PO TABS: 75.0000 mg | ORAL_TABLET | Freq: Every day | ORAL | Status: DC

## 2013-04-15 NOTE — Telephone Encounter (Signed)
Rx has been sent  

## 2013-04-15 NOTE — Telephone Encounter (Signed)
Pt called and stated that he needed his Plavix refilled.  It needs to go to [facility]. Please call with any questions.  Thank you

## 2013-04-15 NOTE — Telephone Encounter (Signed)
Pt has appointment scheduled for 07-11-13 @ 9:30 am. Thank you

## 2013-06-06 ENCOUNTER — Encounter: Payer: Self-pay | Admitting: Nurse Practitioner

## 2013-06-07 ENCOUNTER — Encounter: Payer: Self-pay | Admitting: *Deleted

## 2013-06-08 ENCOUNTER — Encounter (INDEPENDENT_AMBULATORY_CARE_PROVIDER_SITE_OTHER): Payer: Self-pay

## 2013-06-08 ENCOUNTER — Ambulatory Visit (INDEPENDENT_AMBULATORY_CARE_PROVIDER_SITE_OTHER): Payer: 59 | Admitting: Nurse Practitioner

## 2013-06-08 ENCOUNTER — Encounter: Payer: Self-pay | Admitting: Nurse Practitioner

## 2013-06-08 VITALS — BP 140/82 | HR 81 | Ht 72.5 in | Wt 196.0 lb

## 2013-06-08 DIAGNOSIS — G253 Myoclonus: Secondary | ICD-10-CM | POA: Insufficient documentation

## 2013-06-08 DIAGNOSIS — G459 Transient cerebral ischemic attack, unspecified: Secondary | ICD-10-CM | POA: Insufficient documentation

## 2013-06-08 DIAGNOSIS — G43809 Other migraine, not intractable, without status migrainosus: Secondary | ICD-10-CM | POA: Insufficient documentation

## 2013-06-08 MED ORDER — CLOPIDOGREL BISULFATE 75 MG PO TABS: 75.0000 mg | ORAL_TABLET | Freq: Every day | ORAL | Status: DC

## 2013-06-08 NOTE — Progress Notes (Signed)
[facility]  PATIENT: Gregg Guerrero DOB: 12/08/1950   REASON FOR VISIT: Followup for history of left brain ischemic TIA   HISTORY OF PRESENT ILLNESS: Gregg Guerrero, 63 year old male returns for followup. He was last seen in this office 02/11/2012. He has a history of left brain ischemic TIA which occurred in 1998. He has had no further stroke like symptoms, denies double vision, focal weakness, sensory changes, balance issues, no falls. He has  a history of migraines but does not remember when he last had a  headache. He has had an ablation procedure at [facility] for his atrial fib 4.5 yrs ago. He continues to exercise on a daily basis and monitor his  risk factors. His cardiologist is Dr. Donnie Ahoilley. His cholesterol is followed by his primary care Dr. Hyacinth MeekerMiller which he claims has been normal. He returns for reevaluation  REVIEW OF SYSTEMS: Full 14 system review of systems performed and notable only for those listed, all others are neg:  Constitutional: N/A  Cardiovascular: N/A  Ear/Nose/Throat: N/A  Skin: N/A  Eyes: N/A  Respiratory: N/A  Gastroitestinal: N/A  Hematology/Lymphatic: N/A  Endocrine: N/A Musculoskeletal:N/A  Allergy/Immunology: Environmental allergies Neurological: N/A Psychiatric: N/A Sleep : Restless legs  ALLERGIES: No Known Allergies  HOME MEDICATIONS: Outpatient Prescriptions Prior to Visit  Medication Sig Dispense Refill  . aspirin 81 MG tablet Take 81 mg by mouth daily.      . clonazePAM (KLONOPIN) 1 MG tablet Take 1 mg by mouth at bedtime.      . clopidogrel (PLAVIX) 75 MG tablet Take 1 tablet (75 mg total) by mouth daily.  90 tablet  1  . Multiple Vitamins-Minerals (MULTIVITAMIN WITH MINERALS) tablet Take 1 tablet by mouth daily.      Marland Kitchen. oxyCODONE (ROXICODONE) 5 MG immediate release tablet Take 1-2 tablets (5-10 mg total) by mouth every 4 (four) hours as needed.  30 tablet  0   No facility-administered medications prior to visit.    PAST  MEDICAL HISTORY: Past Medical History  Diagnosis Date  . TIA (transient ischemic attack)   . Anxiety   . Sleep apnea     mild-no cpap needed  . Dysrhythmia     hx af    PAST SURGICAL HISTORY: Past Surgical History  Procedure Laterality Date  . Cardiac electrophysiology study and ablation  2009    dr Donnie Ahotilley  . Tonsillectomy    . Colonoscopy    . Foot arthrotomy  2008    rightx2  . Cheilectomy Left 02/03/2013    Procedure: LEFT HALLUX METATARSALPHALANGEAL JOINT CHEILECTOMY;  Surgeon: Toni ArthursJohn Hewitt, MD;  Location: [facility];  Service: Orthopedics;  Laterality: Left;    FAMILY HISTORY: History reviewed. No pertinent family history.  SOCIAL HISTORY: History   Social History  . Marital Status: Married    Spouse Name: N/A    Number of Children: 1  . Years of Education: College    Occupational History  .     Social History Main Topics  . Smoking status: Never Smoker   . Smokeless tobacco: Never Used  . Alcohol Use: Yes     Comment: rare  . Drug Use: No  . Sexual Activity: Not on file   Other Topics Concern  . Not on file   Social History Narrative   Patient lives at home with wife.    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    Patient has a college education.    Patient  has 1 child.    Patient quit caffeine use 2005.   Patient is right handed.      PHYSICAL EXAM  Filed Vitals:   06/08/13 1428  BP: 140/82  Pulse: 81  Height: 6' 0.5" (1.842 m)  Weight: 196 lb (88.905 kg)   Body mass index is 26.2 kg/(m^2).  Generalized: Well developed, in no acute distress  Head: normocephalic and atraumatic,. Oropharynx benign  Neck: Supple, no carotid bruits  Cardiac: Regular rate rhythm, no murmur  Musculoskeletal: No deformity   Neurological examination   Mentation: Alert oriented to time, place, history taking. Follows all commands speech and language fluent  Cranial nerve II-XII: Pupils were equal round reactive to light extraocular  movements were full, visual field were full on confrontational test. Facial sensation and strength were normal. hearing was intact to finger rubbing bilaterally. Uvula tongue midline. head turning and shoulder shrug were normal and symmetric.Tongue protrusion into cheek strength was normal. Motor: normal bulk and tone, full strength in the BUE, BLE, fine finger movements normal, no pronator drift. No focal weakness Sensory: normal and symmetric to light touch, pinprick, and  vibration  Coordination: finger-nose-finger, heel-to-shin bilaterally, no dysmetria Reflexes: Brachioradialis 2/2, biceps 2/2, triceps 2/2, patellar 2/2, Achilles 2/2, plantar responses were flexor bilaterally. Gait and Station: Rising up from seated position without assistance, normal stance,  moderate stride, good arm swing, smooth turning, able to perform tiptoe, and heel walking without difficulty. Tandem gait is steady  DIAGNOSTIC DATA (LABS, IMAGING, TESTING) - ASSESSMENT AND PLAN  63 y.o. year old male  has a past medical history of TIA (transient ischemic attack); Anxiety; and  Migraine here to followup. He is currently on Plavix with minimal bruising  Continue Plavix and ASA Will refill Plavix Cholesterol checked by primary care Continue regular exercise and healthy diet Follow up yearly and when necessary Nilda RiggsNancy Carolyn Nemiah Bubar, [facility] CampusGNP, [facility] CenterBC, APRN  [facility] [address], [address] [place], [place] [address] [phone] carolyn

## 2013-06-08 NOTE — Patient Instructions (Addendum)
Continue Plavix and ASA Will refill Plavix Cholesterol checked by primary care Continue regular exercise and healthy diet Follow up yearly and when necessary

## 2013-07-08 NOTE — Progress Notes (Signed)
I reviewed note and agree with plan.   Vong Garringer R. Mckinley Olheiser, MD  Certified in Neurology, Neurophysiology and Neuroimaging  [facility] [address], [address] [place],  [address] [phone]   

## 2013-07-11 ENCOUNTER — Ambulatory Visit: Payer: Self-pay | Admitting: Nurse Practitioner

## 2013-09-06 ENCOUNTER — Encounter: Payer: Self-pay | Admitting: Nurse Practitioner

## 2013-12-14 ENCOUNTER — Encounter: Payer: Self-pay | Admitting: Neurology

## 2013-12-20 ENCOUNTER — Encounter: Payer: Self-pay | Admitting: Neurology

## 2014-04-05 ENCOUNTER — Other Ambulatory Visit: Payer: Self-pay | Admitting: Family Medicine

## 2014-04-05 ENCOUNTER — Other Ambulatory Visit (HOSPITAL_COMMUNITY): Payer: Self-pay | Admitting: Diagnostic Radiology

## 2014-04-05 DIAGNOSIS — R519 Headache, unspecified: Secondary | ICD-10-CM

## 2014-04-05 DIAGNOSIS — R51 Headache: Principal | ICD-10-CM

## 2014-04-05 LAB — CREATININE, SERUM: Creat: 1.04 mg/dL (ref 0.50–1.35)

## 2014-04-05 LAB — BUN: BUN: 20 mg/dL (ref 6–23)

## 2014-04-07 ENCOUNTER — Ambulatory Visit
Admission: RE | Admit: 2014-04-07 | Discharge: 2014-04-07 | Disposition: A | Discharge status: Home / Self Care | Payer: 59 | Source: Ambulatory Visit | Attending: Family Medicine | Admitting: Family Medicine

## 2014-04-07 DIAGNOSIS — R519 Headache, unspecified: Secondary | ICD-10-CM

## 2014-04-07 DIAGNOSIS — R51 Headache: Principal | ICD-10-CM

## 2014-04-07 MED ORDER — GADOBENATE DIMEGLUMINE 529 MG/ML IV SOLN
18.0000 mL | Freq: Once | INTRAVENOUS | Status: AC | PRN
  Administered 2014-04-07: 18 mL via INTRAVENOUS

## 2014-04-12 ENCOUNTER — Telehealth: Payer: Self-pay | Admitting: Nurse Practitioner

## 2014-04-12 NOTE — Telephone Encounter (Signed)
Dr. Hyacinth MeekerMiller called patient seen in the office several weeks ago with severe migraine. MRI and MRA done and MRI was abnormal. She wants patient to be evaluated by Dr. Levada DyPennumalli. Patient has both history of stroke and migraine in the past. She will make referral, patient to bring MRI with him for appointment

## 2014-04-18 ENCOUNTER — Encounter: Payer: Self-pay | Admitting: Diagnostic Neuroimaging

## 2014-04-18 ENCOUNTER — Ambulatory Visit (INDEPENDENT_AMBULATORY_CARE_PROVIDER_SITE_OTHER): Payer: 59 | Admitting: Diagnostic Neuroimaging

## 2014-04-18 VITALS — BP 145/87 | HR 84 | Ht 73.0 in | Wt 204.2 lb

## 2014-04-18 DIAGNOSIS — G4483 Primary cough headache: Secondary | ICD-10-CM

## 2014-04-18 DIAGNOSIS — G9389 Other specified disorders of brain: Secondary | ICD-10-CM | POA: Diagnosis not present

## 2014-04-18 DIAGNOSIS — G96810 Intracranial hypotension, unspecified: Secondary | ICD-10-CM

## 2014-04-18 NOTE — Progress Notes (Signed)
[facility]  PATIENT: Gregg Guerrero DOB: 1950-12-11  REFERRING CLINICIAN: Sabra Heck  HISTORY FROM: patient  REASON FOR VISIT: new consult    HISTORICAL  CHIEF COMPLAINT:  Chief Complaint  Patient presents with  . New Evaluation    headaches that come and go within the past 2 and a half months     HISTORY OF PRESENT ILLNESS:   64 year old right-handed male here for evaluation of headaches, abnormal MRI, with history of TIAs.  December 2015 patient was in the attic, removing Christmas decorations, when he struck his head on overlying rafters several times. He think he hit his head 3 times. He did not get knocked unconscious. He had significant pain on the top of his head with "knot swelling" over the site of impact. Around January 20 patient had another episode of hitting his head, while getting into the passenger side of the truck. He did not note any significant pain initially. However within 15 minutes he had severe neck pain. One to 2 weeks later he developed onset of intermittent headaches, especially triggered by sneezing, coughing, bending or straining. Headaches have been intermittent, with worse headaches around March 28, 2014. Over the last 2 weeks headaches have spontaneously improved.  Patient has history of migraine phenomenon without headaches, specifically seeing static, spots, scintillating scotoma, associated with photophobia, phonophobia, malaise. Usually he has to lay down and symptoms resolve. He never has a significant headache with these episodes. These occur one to 2 times per month.  Recently patient went to PCP for evaluation. MRI of the brain and MRA of the head were performed, which show diffuse dural enhancement, raising possibility of intracranial hypotension. Otherwise scans were unremarkable.  Patient previously had TIA diagnoses in 1998 in 2000. In 1998 patient had onset of right facial droop lasting for 10-20 minutes and then resolved.  In 2000, patient had second episode where he was mute for and to 20 minutes without other focal numbness or weakness. Interestingly he was able to write normally with a pen and communicate even though he was not able to communicate verbally. Patient has had PFO diagnosed previously. No DVT was found at that time. Patient then diagnosed with HIV fibrillation, status post ablation. Patient currently managed with risk factor reduction and Plavix.    REVIEW OF SYSTEMS: Full 14 system review of systems performed and notable only for headache joint pain neck stiffness    ALLERGIES: No Known Allergies  HOME MEDICATIONS: Outpatient Prescriptions Prior to Visit  Medication Sig Dispense Refill  . aspirin 81 MG tablet Take 81 mg by mouth daily.    . Cholecalciferol (VITAMIN D) 2000 UNITS CAPS Take by mouth.    . clonazePAM (KLONOPIN) 1 MG tablet Take 1 mg by mouth at bedtime.    . clopidogrel (PLAVIX) 75 MG tablet Take 1 tablet (75 mg total) by mouth daily. 90 tablet 3  . Ferrous Sulfate (IRON) 325 (65 FE) MG TABS Take by mouth.    . Multiple Vitamins-Minerals (MULTIVITAMIN WITH MINERALS) tablet Take 1 tablet by mouth daily.    . predniSONE (STERAPRED UNI-PAK) 10 MG tablet      No facility-administered medications prior to visit.    PAST MEDICAL HISTORY: Past Medical History  Diagnosis Date  . TIA (transient ischemic attack)   . Anxiety   . Sleep apnea     mild-no cpap needed  . Dysrhythmia     hx af    PAST SURGICAL HISTORY: Past Surgical History  Procedure Laterality  Date  . Cardiac electrophysiology study and ablation  2009    dr Wynonia Lawman  . Tonsillectomy    . Colonoscopy    . Foot arthrotomy  2008    rightx2  . Cheilectomy Left 02/03/2013    Procedure: LEFT HALLUX METATARSALPHALANGEAL JOINT CHEILECTOMY;  Surgeon: Wylene Simmer, MD;  Location: [facility];  Service: Orthopedics;  Laterality: Left;    FAMILY HISTORY: History reviewed. No pertinent family  history.  SOCIAL HISTORY:  History   Social History  . Marital Status: Married    Spouse Name: N/A  . Number of Children: 1  . Years of Education: College    Occupational History  .     Social History Main Topics  . Smoking status: Never Smoker   . Smokeless tobacco: Never Used  . Alcohol Use: Yes     Comment: rare  . Drug Use: No  . Sexual Activity: Not on file   Other Topics Concern  . Not on file   Social History Narrative   Patient lives at home with wife.    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    Patient has a college education.    Patient has 1 child.    Patient quit caffeine use 2005.   Patient is right handed.      PHYSICAL EXAM  Filed Vitals:   04/18/14 1339  BP: 145/87  Pulse: 84  Height: 6' 1"  (1.854 m)  Weight: 204 lb 3.2 oz (92.625 kg)    Body mass index is 26.95 kg/(m^2).  No exam data present  No flowsheet data found.  GENERAL EXAM: Patient is in no distress; well developed, nourished and groomed; neck is supple  CARDIOVASCULAR: Regular rate and rhythm, no murmurs, no carotid bruits  NEUROLOGIC: MENTAL STATUS: awake, alert, oriented to person, place and time, recent and remote memory intact, normal attention and concentration, language fluent, comprehension intact, naming intact, fund of knowledge appropriate CRANIAL NERVE: no papilledema on fundoscopic exam, pupils equal and reactive to light, visual fields full to confrontation, extraocular muscles intact, no nystagmus, facial sensation and strength symmetric, hearing intact, palate elevates symmetrically, uvula midline, shoulder shrug symmetric, tongue midline. MOTOR: normal bulk and tone, full strength in the BUE, BLE SENSORY: normal and symmetric to light touch, pinprick, temperature, vibration and proprioception COORDINATION: finger-nose-finger, fine finger movements, heel-shin normal REFLEXES: deep tendon reflexes present and symmetric GAIT/STATION: narrow based  gait; able to walk on toes, heels and tandem; romberg is negative    DIAGNOSTIC DATA (LABS, IMAGING, TESTING) - I reviewed patient records, labs, notes, testing and imaging myself where available.  Lab Results  Component Value Date   WBC 6.3 12/04/2006   HGB 12.2* 02/03/2013   HCT 43.7 12/04/2006   MCV 88.0 12/04/2006   PLT 225 12/04/2006      Component Value Date/Time   NA 138 12/04/2006 1521   K 4.2 12/04/2006 1521   CL 107 12/04/2006 1521   CO2 25 12/04/2006 1521   GLUCOSE 91 12/04/2006 1521   BUN 20 04/05/2014 1622   CREATININE 1.04 04/05/2014 1622   CREATININE 1.34 12/04/2006 1521   CALCIUM 9.0 12/04/2006 1521   PROT 6.2 12/04/2006 1521   ALBUMIN 3.7 12/04/2006 1521   AST 21 12/04/2006 1521   ALT 25 12/04/2006 1521   ALKPHOS 61 12/04/2006 1521   BILITOT 0.8 12/04/2006 1521   GFRNONAA 55* 12/04/2006 1521   GFRAA  12/04/2006 1521    >60  The eGFR has been calculated using the MDRD equation. This calculation has not been validated in all clinical   No results found for: CHOL, HDL, LDLCALC, LDLDIRECT, TRIG, CHOLHDL No results found for: HGBA1C No results found for: VITAMINB12 Lab Results  Component Value Date   TSH 0.908 *Test methodology is 3rd generation TSH* 12/04/2006    04/07/14 MRI brain [I reviewed images myself and agree with interpretation. -VRP]  1. Mild, diffuse dural enhancement. No other signs of intracranial hypotension are clearly identified, however if this is a clinical consideration correlation with lumbar puncture and opening pressure may be informative. 2. Chronic left cerebellar infarct, unchanged.  04/07/14 MRA head [I reviewed images myself and agree with interpretation. -VRP]  - No major intracranial arterial occlusion. Mild bilateral supraclinoid ICA narrowing.     ASSESSMENT AND PLAN  64 y.o. year old male here with history of atrial fibrillation status post ablation, TIA 2, PFO, migraine phenomenon, restless legs, vitamin D  deficiency, hyper lipidemia, here for evaluation of intermittent headaches and abnormal MRI brain. I reviewed scan and agree there is mild diffuse dural enhancement. This can be nonspecific, but can be associated with intracranial hypotension, inflammatory or infectious etiologies. Overall patient's clinical course is gradually improving spontaneously, and therefore I do not suspect a serious underlying cause. His history of multiple mild/minor head traumas, which could be a factor in developing an occult CSF leak. Again his clinical course is improving and therefore if he did develop a leak, I suspect that it has sealed/repaired on its own.    PLAN: - Monitor symptoms - We'll repeat MRI brain in 6 weeks to ensure stability/resolution of enhancement - continue plavix, fish oil for stroke risk reduction  Orders Placed This Encounter  Procedures  . MR Brain W Wo Contrast   Return in about 2 months (around 06/18/2014).    Penni Bombard, MD 9/39/6886, 4:84 PM Certified in Neurology, Neurophysiology and Neuroimaging  [facility] [address] [place], [place] [address] [phone]

## 2014-04-18 NOTE — Patient Instructions (Signed)
I will repeat MRI in 6 weeks.  Stay hydrated. Avoid heavy exertion/straining.

## 2014-06-09 ENCOUNTER — Ambulatory Visit: Payer: 59 | Admitting: Nurse Practitioner

## 2014-06-12 ENCOUNTER — Ambulatory Visit: Payer: 59 | Admitting: Nurse Practitioner

## 2014-06-15 ENCOUNTER — Telehealth: Payer: Self-pay | Admitting: Diagnostic Neuroimaging

## 2014-06-15 NOTE — Telephone Encounter (Signed)
error 

## 2014-06-16 ENCOUNTER — Telehealth: Payer: Self-pay | Admitting: Neurology

## 2014-06-16 ENCOUNTER — Ambulatory Visit
Admission: RE | Admit: 2014-06-16 | Discharge: 2014-06-16 | Disposition: A | Discharge status: Home / Self Care | Payer: 59 | Source: Ambulatory Visit | Attending: Diagnostic Neuroimaging | Admitting: Diagnostic Neuroimaging

## 2014-06-16 DIAGNOSIS — G4483 Primary cough headache: Secondary | ICD-10-CM

## 2014-06-16 DIAGNOSIS — G96810 Intracranial hypotension, unspecified: Secondary | ICD-10-CM

## 2014-06-16 DIAGNOSIS — S065X9A Traumatic subdural hemorrhage with loss of consciousness of unspecified duration, initial encounter: Secondary | ICD-10-CM

## 2014-06-16 DIAGNOSIS — G9389 Other specified disorders of brain: Principal | ICD-10-CM

## 2014-06-16 DIAGNOSIS — S065XAA Traumatic subdural hemorrhage with loss of consciousness status unknown, initial encounter: Secondary | ICD-10-CM

## 2014-06-16 MED ORDER — GADOBENATE DIMEGLUMINE 529 MG/ML IV SOLN
18.0000 mL | Freq: Once | INTRAVENOUS | Status: AC | PRN
  Administered 2014-06-16: 18 mL via INTRAVENOUS

## 2014-06-16 NOTE — Telephone Encounter (Signed)
I have called patient after receiving the call from [place] image, MRI today showed increased bilateral subdural hematomas, right larger than left.No midline shift.  Patient is doing well. I also talked with Neurosurgeon Dr. Dutch QuintPoole, will see patient next week.Refer was placed.  Advised patient's wife continue to monitor him, avoid strenuous activities, stop Plavix, keep Asa 81mg , he has history of stroke.  Keep follow up appt May 24th.

## 2014-06-20 ENCOUNTER — Encounter: Payer: Self-pay | Admitting: Diagnostic Neuroimaging

## 2014-06-20 ENCOUNTER — Ambulatory Visit (INDEPENDENT_AMBULATORY_CARE_PROVIDER_SITE_OTHER): Payer: 59 | Admitting: Diagnostic Neuroimaging

## 2014-06-20 VITALS — BP 136/90 | HR 80 | Ht 73.0 in | Wt 201.6 lb

## 2014-06-20 DIAGNOSIS — S065XAA Traumatic subdural hemorrhage with loss of consciousness status unknown, initial encounter: Secondary | ICD-10-CM

## 2014-06-20 DIAGNOSIS — S065X9A Traumatic subdural hemorrhage with loss of consciousness of unspecified duration, initial encounter: Secondary | ICD-10-CM

## 2014-06-20 DIAGNOSIS — I6200 Nontraumatic subdural hemorrhage, unspecified: Secondary | ICD-10-CM | POA: Diagnosis not present

## 2014-06-20 DIAGNOSIS — G4483 Primary cough headache: Secondary | ICD-10-CM

## 2014-06-20 NOTE — Progress Notes (Signed)
[facility]  PATIENT: Gregg Guerrero DOB: November 01, 1950  REFERRING CLINICIAN: Sabra Heck  HISTORY FROM: patient and wife  REASON FOR VISIT: follow up    HISTORICAL  CHIEF COMPLAINT:  Chief Complaint  Patient presents with  . Follow-up    intracranial hypotension    HISTORY OF PRESENT ILLNESS:   UPDATE 06/20/14: Since last visit, wife thinks general mood and energy level is down. MRI brain on Friday showed subdural hematomas (mixed ages). Also notes clear nasal drainage (left nostril) since Feb 2016, usually in the early AM. Headaches have continued. No headaches today, but they have been fluctuating. Also developed hearing loss 3 weeks ago with ringing in ears.  PRIOR HPI (04/18/14): 64 year old right-handed male here for evaluation of headaches, abnormal MRI, with history of TIAs. December 2015 patient was in the attic, removing Christmas decorations, when he struck his head on overlying rafters several times. He think he hit his head 3 times. He did not get knocked unconscious. He had significant pain on the top of his head with "knot swelling" over the site of impact. Around January 20 patient had another episode of hitting his head, while getting into the passenger side of the truck. He did not note any significant pain initially. However within 15 minutes he had severe neck pain. One to 2 weeks later he developed onset of intermittent headaches, especially triggered by sneezing, coughing, bending or straining. Headaches have been intermittent, with worse headaches around March 28, 2014. Over the last 2 weeks headaches have spontaneously improved. Patient has history of migraine phenomenon without headaches, specifically seeing static, spots, scintillating scotoma, associated with photophobia, phonophobia, malaise. Usually he has to lay down and symptoms resolve. He never has a significant headache with these episodes. These occur one to 2 times per month. Recently patient  went to PCP for evaluation. MRI of the brain and MRA of the head were performed, which show diffuse dural enhancement, raising possibility of intracranial hypotension. Otherwise scans were unremarkable. Patient previously had TIA diagnoses in 1998 in 2000. In 1998 patient had onset of right facial droop lasting for 10-20 minutes and then resolved. In 2000, patient had second episode where he was mute for and to 20 minutes without other focal numbness or weakness. Interestingly he was able to write normally with a pen and communicate even though he was not able to communicate verbally. Patient has had PFO diagnosed previously. No DVT was found at that time. Patient then diagnosed with atrial fibrillation, status post ablation. Patient currently managed with risk factor reduction and Plavix.    REVIEW OF SYSTEMS: Full 14 system review of systems performed and notable only for hearing loss fatigue activity change ringing in ears    ALLERGIES: No Known Allergies  HOME MEDICATIONS: Outpatient Prescriptions Prior to Visit  Medication Sig Dispense Refill  . ascorbic acid (VITAMIN C) 250 MG tablet Take 250 mg by mouth daily.    Marland Kitchen aspirin 81 MG tablet Take 81 mg by mouth daily.    . Cholecalciferol (VITAMIN D) 2000 UNITS CAPS Take by mouth.    . clonazePAM (KLONOPIN) 1 MG tablet Take 1 mg by mouth at bedtime.    . clopidogrel (PLAVIX) 75 MG tablet Take 1 tablet (75 mg total) by mouth daily. 90 tablet 3  . Ferrous Sulfate (IRON) 325 (65 FE) MG TABS Take by mouth.    . Omega-3 Fatty Acids (FISH OIL) 1000 MG CAPS Take 1 capsule by mouth daily.     No  facility-administered medications prior to visit.    PAST MEDICAL HISTORY: Past Medical History  Diagnosis Date  . TIA (transient ischemic attack)   . Anxiety   . Sleep apnea     mild-no cpap needed  . Dysrhythmia     hx af    PAST SURGICAL HISTORY: Past Surgical History  Procedure Laterality Date  . Cardiac electrophysiology study and ablation   2009    dr Wynonia Lawman  . Tonsillectomy    . Colonoscopy    . Foot arthrotomy  2008    rightx2  . Cheilectomy Left 02/03/2013    Procedure: LEFT HALLUX METATARSALPHALANGEAL JOINT CHEILECTOMY;  Surgeon: Wylene Simmer, MD;  Location: [facility];  Service: Orthopedics;  Laterality: Left;    FAMILY HISTORY: History reviewed. No pertinent family history.  SOCIAL HISTORY:  History   Social History  . Marital Status: Married    Spouse Name: Jackelyn Poling  . Number of Children: 1  . Years of Education: College    Occupational History  .     Social History Main Topics  . Smoking status: Never Smoker   . Smokeless tobacco: Never Used  . Alcohol Use: Yes     Comment: rare  . Drug Use: No  . Sexual Activity: Not on file   Other Topics Concern  . Not on file   Social History Narrative   Patient lives at home with wife.    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    Patient has a college education.    Patient has 1 child.    Patient quit caffeine use 2005.   Patient is right handed.      PHYSICAL EXAM  Filed Vitals:   06/20/14 1337  BP: 136/90  Pulse: 80  Height: 6' 1"  (1.854 m)  Weight: 201 lb 9.6 oz (91.445 kg)    Body mass index is 26.6 kg/(m^2).  No exam data present  No flowsheet data found.  GENERAL EXAM: Patient is in no distress; well developed, nourished and groomed; neck is supple  CARDIOVASCULAR: Regular rate and rhythm, no murmurs, no carotid bruits  NEUROLOGIC: MENTAL STATUS: awake, alert, language fluent, comprehension intact, naming intact, fund of knowledge appropriate CRANIAL NERVE: no papilledema on fundoscopic exam, pupils equal and reactive to light, visual fields full to confrontation, extraocular muscles intact, no nystagmus, facial sensation and strength symmetric, hearing intact, palate elevates symmetrically, uvula midline, shoulder shrug symmetric, tongue midline. MOTOR: normal bulk and tone, full strength in the BUE,  BLE SENSORY: normal and symmetric to light touch, pinprick, temperature, vibration; NO EXTINCTION COORDINATION: finger-nose-finger, fine finger movements normal REFLEXES: deep tendon reflexes present and symmetric GAIT/STATION: narrow based gait; able to walk on toes, heels and tandem; romberg is negative    DIAGNOSTIC DATA (LABS, IMAGING, TESTING) - I reviewed patient records, labs, notes, testing and imaging myself where available.  Lab Results  Component Value Date   WBC 6.3 12/04/2006   HGB 12.2* 02/03/2013   HCT 43.7 12/04/2006   MCV 88.0 12/04/2006   PLT 225 12/04/2006      Component Value Date/Time   NA 138 12/04/2006 1521   K 4.2 12/04/2006 1521   CL 107 12/04/2006 1521   CO2 25 12/04/2006 1521   GLUCOSE 91 12/04/2006 1521   BUN 20 04/05/2014 1622   CREATININE 1.04 04/05/2014 1622   CREATININE 1.34 12/04/2006 1521   CALCIUM 9.0 12/04/2006 1521   PROT 6.2 12/04/2006 1521   ALBUMIN 3.7 12/04/2006 1521  AST 21 12/04/2006 1521   ALT 25 12/04/2006 1521   ALKPHOS 61 12/04/2006 1521   BILITOT 0.8 12/04/2006 1521   GFRNONAA 55* 12/04/2006 1521   GFRAA  12/04/2006 1521    >60        The eGFR has been calculated using the MDRD equation. This calculation has not been validated in all clinical   No results found for: CHOL, HDL, LDLCALC, LDLDIRECT, TRIG, CHOLHDL No results found for: HGBA1C No results found for: VITAMINB12 Lab Results  Component Value Date   TSH 0.908 *Test methodology is 3rd generation TSH* 12/04/2006    04/07/14 MRI brain [I reviewed images myself and agree with interpretation. -VRP]  1. Mild, diffuse dural enhancement. No other signs of intracranial hypotension are clearly identified, however if this is a clinical consideration correlation with lumbar puncture and opening pressure may be informative. 2. Chronic left cerebellar infarct, unchanged.  04/07/14 MRA head [I reviewed images myself and agree with interpretation. -VRP]  - No major  intracranial arterial occlusion. Mild bilateral supraclinoid ICA narrowing.  06/16/14 MRI brain [I reviewed images myself and agree with interpretation. -VRP]  1. New, small bilateral subdural hematomas, right larger than left. No midline shift. 2. Increased, moderate diffuse dural thickening and enhancement. Considerations include intracranial hypotension, reactive dural thickening due to subdural hemorrhage, infection, and neoplasm.       ASSESSMENT AND PLAN  64 y.o. year old male here with history of atrial fibrillation status post ablation, TIA 2, PFO, migraine phenomenon, restless legs, vitamin D deficiency, hyper lipidemia, here for evaluation of intermittent headaches and abnormal MRI brain (initially mild diffuse dural enhancement; follow up shows mixed age subdural hemorrhages).   His history of multiple mild/minor head traumas, which could be a factor in developing a CSF leak (? Dec 2015 - Jan 2016), especially now that he mentions clear drainage from the left nostil, which could represent traumatic CSF rhinorrhea. The low CSF pressure in combination with dual anti-platelet could have then triggered the multiple subdural hemorrhages (may have occurred b/w Mar and May 2016), as well as the dural enhancement. Other causes would include infection, inflammation and neoplastic causes, but I feel these are less likely.    PLAN: - follow up with neurosurgery tomorrow for subdural hemorrhage evaluation and CSF leak evaluation; may need CT head follow up and potential surgical repair for CSF leak - continue aspirin, fish oil for stroke risk reduction; agree with stopping plavix  Return in about 1 month (around 07/21/2014).  I spent 25 minutes of face to face time with patient. Greater than 50% of time was spent in counseling and coordination of care with patient.     Penni Bombard, MD 3/00/9233, 0:07 PM Certified in Neurology, Neurophysiology and Neuroimaging  [facility] [address], [place], [place] [address] [phone]

## 2014-06-21 ENCOUNTER — Other Ambulatory Visit (HOSPITAL_COMMUNITY): Payer: Self-pay | Admitting: Neurosurgery

## 2014-06-21 ENCOUNTER — Ambulatory Visit (HOSPITAL_COMMUNITY)
Admission: RE | Admit: 2014-06-21 | Discharge: 2014-06-21 | Disposition: A | Discharge status: Home / Self Care | Payer: 59 | Source: Ambulatory Visit | Attending: Neurosurgery | Admitting: Neurosurgery

## 2014-06-21 DIAGNOSIS — I6200 Nontraumatic subdural hemorrhage, unspecified: Secondary | ICD-10-CM | POA: Insufficient documentation

## 2014-06-21 DIAGNOSIS — R51 Headache: Secondary | ICD-10-CM | POA: Insufficient documentation

## 2014-06-21 DIAGNOSIS — R519 Headache, unspecified: Secondary | ICD-10-CM

## 2014-06-22 ENCOUNTER — Other Ambulatory Visit: Payer: Self-pay | Admitting: Neurosurgery

## 2014-07-11 ENCOUNTER — Encounter (HOSPITAL_COMMUNITY): Payer: Self-pay

## 2014-07-11 ENCOUNTER — Encounter (HOSPITAL_COMMUNITY)
Admission: RE | Admit: 2014-07-11 | Discharge: 2014-07-11 | Disposition: A | Discharge status: Home / Self Care | Payer: 59 | Source: Ambulatory Visit | Attending: Neurosurgery | Admitting: Neurosurgery

## 2014-07-11 DIAGNOSIS — Z01818 Encounter for other preprocedural examination: Secondary | ICD-10-CM | POA: Insufficient documentation

## 2014-07-11 HISTORY — DX: Patent foramen ovale: Q21.12

## 2014-07-11 HISTORY — DX: Gastro-esophageal reflux disease without esophagitis: K21.9

## 2014-07-11 HISTORY — DX: Unspecified osteoarthritis, unspecified site: M19.90

## 2014-07-11 HISTORY — DX: Personal history of transient ischemic attack (TIA), and cerebral infarction without residual deficits: Z86.73

## 2014-07-11 HISTORY — DX: Atrial septal defect: Q21.1

## 2014-07-11 HISTORY — DX: Headache, unspecified: R51.9

## 2014-07-11 HISTORY — DX: Headache: R51

## 2014-07-11 LAB — CBC WITH DIFFERENTIAL/PLATELET
BASOS ABS: 0 10*3/uL (ref 0.0–0.1)
Basophils Relative: 0 % (ref 0–1)
Eosinophils Absolute: 0.1 10*3/uL (ref 0.0–0.7)
Eosinophils Relative: 1 % (ref 0–5)
HCT: 48.4 % (ref 39.0–52.0)
Hemoglobin: 16.4 g/dL (ref 13.0–17.0)
Lymphocytes Relative: 17 % (ref 12–46)
Lymphs Abs: 1 10*3/uL (ref 0.7–4.0)
MCH: 29.4 pg (ref 26.0–34.0)
MCHC: 33.9 g/dL (ref 30.0–36.0)
MCV: 86.7 fL (ref 78.0–100.0)
Monocytes Absolute: 0.5 10*3/uL (ref 0.1–1.0)
Monocytes Relative: 8 % (ref 3–12)
NEUTROS ABS: 4.6 10*3/uL (ref 1.7–7.7)
NEUTROS PCT: 74 % (ref 43–77)
Platelets: 207 10*3/uL (ref 150–400)
RBC: 5.58 MIL/uL (ref 4.22–5.81)
RDW: 13.1 % (ref 11.5–15.5)
WBC: 6.2 10*3/uL (ref 4.0–10.5)

## 2014-07-11 LAB — BASIC METABOLIC PANEL
ANION GAP: 7 (ref 5–15)
BUN: 19 mg/dL (ref 6–20)
CO2: 26 mmol/L (ref 22–32)
Calcium: 9 mg/dL (ref 8.9–10.3)
Chloride: 106 mmol/L (ref 101–111)
Creatinine, Ser: 1.12 mg/dL (ref 0.61–1.24)
GFR calc Af Amer: >60 mL/min (ref >60)
GFR calc non Af Amer: >60 mL/min (ref >60)
Glucose, Bld: 109 mg/dL — ABNORMAL HIGH (ref 65–99)
Potassium: 4.6 mmol/L (ref 3.5–5.1)
Sodium: 139 mmol/L (ref 135–145)

## 2014-07-11 NOTE — Pre-Procedure Instructions (Addendum)
PAULINO SARTIN 07/11/2014      [facility] - [place], [place] - [address] [place] [address] [place] [address] Phone: [phone] Fax: [phone]  [id number] [address] [name] [address] Phone: [phone] Fax: [phone]    Your procedure is scheduled on Thursday July 20, 2014 at 0800.  Report to [facility] Admitting at 6:00 A.M.  Call this number if you have problems the morning of surgery:  [phone]   Remember:  Do not eat food or drink liquids after midnight.  Take these medicines the morning of surgery with A SIP OF WATER certirizine (Zyrtec), clonazepam (Klonopin).   Do not take any aspirin, NSAIDS (advil, aleve, naproxen, ibuprofen), vitamins, supplements, fish oil and/or herbal medications five days prior to surgery starting Saturday July 14, 2104.  Follow instructions for stopping Plavix per Dr. Jordan Likes for surgery.   Do not wear jewelry.  Do not wear lotions, powders, or colognes.    Do not shave 48 hours prior to surgery.  Men may shave face and neck.  Do not bring valuables to the hospital.  [facility] is not responsible for any belongings or valuables.  Contacts, dentures or bridgework may not be worn into surgery.  Leave your suitcase in the car.  After surgery it may be brought to your room.  For patients admitted to the hospital, discharge time will be determined by your treatment team.  Patients discharged the day of surgery will not be allowed to drive home.    Please read over the following fact sheets that you were given. Pain Booklet, Coughing and Deep Breathing and Surgical Site Infection Prevention

## 2014-07-11 NOTE — Progress Notes (Signed)
   07/11/14 1453  OBSTRUCTIVE SLEEP APNEA  Have you ever been diagnosed with sleep apnea through a sleep study? No  Do you snore loudly (loud enough to be heard through closed doors)?  1  Do you often feel tired, fatigued, or sleepy during the daytime? 0  Has anyone observed you stop breathing during your sleep? 0  Do you have, or are you being treated for high blood pressure? 0  BMI more than 35 kg/m2? 0  Age over 64 years old? 1  Neck circumference greater than 40 cm/16 inches? 0 (16.5)  Gender: 1  Obstructive Sleep Apnea Score 3

## 2014-07-12 ENCOUNTER — Other Ambulatory Visit: Payer: Self-pay | Admitting: Neurosurgery

## 2014-07-12 ENCOUNTER — Other Ambulatory Visit (HOSPITAL_COMMUNITY): Payer: Self-pay | Admitting: Neurosurgery

## 2014-07-12 DIAGNOSIS — S065X9A Traumatic subdural hemorrhage with loss of consciousness of unspecified duration, initial encounter: Secondary | ICD-10-CM

## 2014-07-12 DIAGNOSIS — I6200 Nontraumatic subdural hemorrhage, unspecified: Secondary | ICD-10-CM

## 2014-07-12 DIAGNOSIS — S065XAA Traumatic subdural hemorrhage with loss of consciousness status unknown, initial encounter: Secondary | ICD-10-CM

## 2014-07-12 NOTE — Progress Notes (Signed)
Anesthesia Chart Review:  Pt is 64 year old male scheduled for burr holes for evacuation of subdural hematoma on 07/20/2014 with Dr. Jordan Likes.   Cardiologist is Dr. Donnie Aho, last office visit 06/27/14.  PMH includes: PAF (hx of ablation with no recurrence of afib), TIA (x2), OSA, PFO, GERD.  Never smoker. BMI 26.5. S/p L hallux MTP joint cheilectomy 02/03/13.   Medications include: ASA, plavix, klonopin.    Preoperative labs reviewed.   EKG 06/27/2014: sinus rhythm. PVCs. Horizontal axis. Nonspecific ST and T wave changes.   Pt has clearance from Dr. Donnie Aho for surgery.   If no changes, I anticipate pt can proceed with surgery as scheduled.   Rica Mast, FNP-BC [facility] Stay [facility]/Anesthesiology Phone: [phone] 07/12/2014 1:38 PM

## 2014-07-19 ENCOUNTER — Encounter (HOSPITAL_COMMUNITY): Payer: Self-pay

## 2014-07-19 ENCOUNTER — Ambulatory Visit (HOSPITAL_COMMUNITY)
Admission: RE | Admit: 2014-07-19 | Discharge: 2014-07-19 | Disposition: A | Discharge status: Home / Self Care | Payer: 59 | Source: Ambulatory Visit | Attending: Neurosurgery | Admitting: Neurosurgery

## 2014-07-19 DIAGNOSIS — G4730 Sleep apnea, unspecified: Secondary | ICD-10-CM | POA: Diagnosis present

## 2014-07-19 DIAGNOSIS — I6200 Nontraumatic subdural hemorrhage, unspecified: Secondary | ICD-10-CM

## 2014-07-19 DIAGNOSIS — F419 Anxiety disorder, unspecified: Secondary | ICD-10-CM | POA: Diagnosis present

## 2014-07-19 DIAGNOSIS — M1990 Unspecified osteoarthritis, unspecified site: Secondary | ICD-10-CM | POA: Diagnosis present

## 2014-07-19 DIAGNOSIS — I4891 Unspecified atrial fibrillation: Secondary | ICD-10-CM | POA: Diagnosis present

## 2014-07-19 DIAGNOSIS — Z8673 Personal history of transient ischemic attack (TIA), and cerebral infarction without residual deficits: Secondary | ICD-10-CM

## 2014-07-19 DIAGNOSIS — Z01818 Encounter for other preprocedural examination: Secondary | ICD-10-CM

## 2014-07-19 DIAGNOSIS — Z7982 Long term (current) use of aspirin: Secondary | ICD-10-CM

## 2014-07-19 DIAGNOSIS — Z7902 Long term (current) use of antithrombotics/antiplatelets: Secondary | ICD-10-CM

## 2014-07-19 DIAGNOSIS — Z79899 Other long term (current) drug therapy: Secondary | ICD-10-CM

## 2014-07-19 DIAGNOSIS — Q211 Atrial septal defect: Secondary | ICD-10-CM

## 2014-07-19 DIAGNOSIS — K219 Gastro-esophageal reflux disease without esophagitis: Secondary | ICD-10-CM | POA: Diagnosis present

## 2014-07-19 DIAGNOSIS — I6202 Nontraumatic subacute subdural hemorrhage: Principal | ICD-10-CM | POA: Diagnosis present

## 2014-07-19 MED ORDER — DEXAMETHASONE SODIUM PHOSPHATE 10 MG/ML IJ SOLN
10.0000 mg | INTRAMUSCULAR | Status: AC
  Administered 2014-07-20: 10 mg via INTRAVENOUS
  Filled 2014-07-19: qty 1

## 2014-07-19 MED ORDER — CEFAZOLIN SODIUM-DEXTROSE 2-3 GM-% IV SOLR
2.0000 g | INTRAVENOUS | Status: AC
  Administered 2014-07-20: 2 g via INTRAVENOUS
  Filled 2014-07-19: qty 50

## 2014-07-20 ENCOUNTER — Inpatient Hospital Stay (HOSPITAL_COMMUNITY): Payer: 59 | Admitting: Emergency Medicine

## 2014-07-20 ENCOUNTER — Inpatient Hospital Stay (HOSPITAL_COMMUNITY)
Admission: RE | Admit: 2014-07-20 | Discharge: 2014-07-21 | DRG: 026 | Disposition: A | Discharge status: Home / Self Care | Payer: 59 | Source: Ambulatory Visit | Attending: Neurosurgery | Admitting: Neurosurgery

## 2014-07-20 ENCOUNTER — Encounter (HOSPITAL_COMMUNITY)
Admission: RE | Disposition: A | Discharge status: Home / Self Care | Payer: Self-pay | Source: Ambulatory Visit | Attending: Neurosurgery

## 2014-07-20 ENCOUNTER — Encounter (HOSPITAL_COMMUNITY): Payer: Self-pay | Admitting: Anesthesiology

## 2014-07-20 ENCOUNTER — Inpatient Hospital Stay (HOSPITAL_COMMUNITY): Payer: 59 | Admitting: Anesthesiology

## 2014-07-20 ENCOUNTER — Ambulatory Visit (HOSPITAL_COMMUNITY): Payer: 59

## 2014-07-20 DIAGNOSIS — I4891 Unspecified atrial fibrillation: Secondary | ICD-10-CM | POA: Diagnosis present

## 2014-07-20 DIAGNOSIS — I6202 Nontraumatic subacute subdural hemorrhage: Secondary | ICD-10-CM | POA: Diagnosis present

## 2014-07-20 DIAGNOSIS — Q211 Atrial septal defect: Secondary | ICD-10-CM | POA: Diagnosis not present

## 2014-07-20 DIAGNOSIS — G4730 Sleep apnea, unspecified: Secondary | ICD-10-CM | POA: Diagnosis present

## 2014-07-20 DIAGNOSIS — F419 Anxiety disorder, unspecified: Secondary | ICD-10-CM | POA: Diagnosis present

## 2014-07-20 DIAGNOSIS — K219 Gastro-esophageal reflux disease without esophagitis: Secondary | ICD-10-CM | POA: Diagnosis present

## 2014-07-20 DIAGNOSIS — Z7902 Long term (current) use of antithrombotics/antiplatelets: Secondary | ICD-10-CM | POA: Diagnosis not present

## 2014-07-20 DIAGNOSIS — M1990 Unspecified osteoarthritis, unspecified site: Secondary | ICD-10-CM | POA: Diagnosis present

## 2014-07-20 DIAGNOSIS — Z79899 Other long term (current) drug therapy: Secondary | ICD-10-CM | POA: Diagnosis not present

## 2014-07-20 DIAGNOSIS — Z7982 Long term (current) use of aspirin: Secondary | ICD-10-CM | POA: Diagnosis not present

## 2014-07-20 DIAGNOSIS — S065X9A Traumatic subdural hemorrhage with loss of consciousness of unspecified duration, initial encounter: Secondary | ICD-10-CM | POA: Diagnosis present

## 2014-07-20 DIAGNOSIS — S065XAA Traumatic subdural hemorrhage with loss of consciousness status unknown, initial encounter: Secondary | ICD-10-CM | POA: Diagnosis present

## 2014-07-20 DIAGNOSIS — Z8673 Personal history of transient ischemic attack (TIA), and cerebral infarction without residual deficits: Secondary | ICD-10-CM | POA: Diagnosis not present

## 2014-07-20 HISTORY — PX: BURR HOLE: SHX908

## 2014-07-20 LAB — MRSA PCR SCREENING: MRSA BY PCR: NEGATIVE

## 2014-07-20 SURGERY — CREATION, CRANIAL BURR HOLE
Anesthesia: General | Site: Head | Laterality: Right

## 2014-07-20 MED ORDER — 0.9 % SODIUM CHLORIDE (POUR BTL) OPTIME
TOPICAL | Status: DC | PRN
  Administered 2014-07-20 (×2): 1000 mL

## 2014-07-20 MED ORDER — THROMBIN 20000 UNITS EX SOLR
CUTANEOUS | Status: DC | PRN
  Administered 2014-07-20: 08:00:00 via TOPICAL

## 2014-07-20 MED ORDER — ONDANSETRON HCL 4 MG/2ML IJ SOLN: 4.0000 mg | INTRAMUSCULAR | Status: DC | PRN

## 2014-07-20 MED ORDER — PROMETHAZINE HCL 25 MG PO TABS: 12.5000 mg | ORAL_TABLET | ORAL | Status: DC | PRN

## 2014-07-20 MED ORDER — PANTOPRAZOLE SODIUM 40 MG IV SOLR
40.0000 mg | Freq: Every day | INTRAVENOUS | Status: DC
  Administered 2014-07-20: 40 mg via INTRAVENOUS
  Filled 2014-07-20 (×2): qty 40

## 2014-07-20 MED ORDER — ONDANSETRON HCL 4 MG PO TABS: 4.0000 mg | ORAL_TABLET | ORAL | Status: DC | PRN

## 2014-07-20 MED ORDER — LACTATED RINGERS IV SOLN
INTRAVENOUS | Status: DC | PRN
  Administered 2014-07-20: 08:00:00 via INTRAVENOUS

## 2014-07-20 MED ORDER — SODIUM CHLORIDE 0.9 % IR SOLN
Status: DC | PRN
  Administered 2014-07-20: 08:00:00

## 2014-07-20 MED ORDER — HYDROMORPHONE HCL 1 MG/ML IJ SOLN
0.5000 mg | INTRAMUSCULAR | Status: DC | PRN
  Administered 2014-07-20 (×3): 1 mg via INTRAVENOUS
  Filled 2014-07-20 (×3): qty 1

## 2014-07-20 MED ORDER — MIDAZOLAM HCL 2 MG/2ML IJ SOLN
INTRAMUSCULAR | Status: AC
  Filled 2014-07-20: qty 2

## 2014-07-20 MED ORDER — PROPOFOL 10 MG/ML IV BOLUS
INTRAVENOUS | Status: DC | PRN
  Administered 2014-07-20: 180 mg via INTRAVENOUS

## 2014-07-20 MED ORDER — PHENYLEPHRINE HCL 10 MG/ML IJ SOLN
INTRAMUSCULAR | Status: DC | PRN
  Administered 2014-07-20 (×2): 40 ug via INTRAVENOUS

## 2014-07-20 MED ORDER — ONDANSETRON HCL 4 MG/2ML IJ SOLN
INTRAMUSCULAR | Status: DC | PRN
  Administered 2014-07-20: 4 mg via INTRAVENOUS

## 2014-07-20 MED ORDER — HYDROMORPHONE HCL 1 MG/ML IJ SOLN: 0.2500 mg | INTRAMUSCULAR | Status: DC | PRN

## 2014-07-20 MED ORDER — CLONAZEPAM 1 MG PO TABS
1.0000 mg | ORAL_TABLET | Freq: Every day | ORAL | Status: DC
  Administered 2014-07-20: 1 mg via ORAL
  Filled 2014-07-20: qty 1

## 2014-07-20 MED ORDER — LORATADINE 10 MG PO TABS
10.0000 mg | ORAL_TABLET | Freq: Every day | ORAL | Status: DC
  Filled 2014-07-20 (×2): qty 1

## 2014-07-20 MED ORDER — NEOSTIGMINE METHYLSULFATE 10 MG/10ML IV SOLN
INTRAVENOUS | Status: DC | PRN
  Administered 2014-07-20: 4 mg via INTRAVENOUS

## 2014-07-20 MED ORDER — FENTANYL CITRATE (PF) 100 MCG/2ML IJ SOLN
INTRAMUSCULAR | Status: DC | PRN
  Administered 2014-07-20 (×2): 50 ug via INTRAVENOUS

## 2014-07-20 MED ORDER — FENTANYL CITRATE (PF) 250 MCG/5ML IJ SOLN
INTRAMUSCULAR | Status: AC
  Filled 2014-07-20: qty 5

## 2014-07-20 MED ORDER — ACETAMINOPHEN 650 MG RE SUPP: 650.0000 mg | RECTAL | Status: DC | PRN

## 2014-07-20 MED ORDER — LIDOCAINE HCL (CARDIAC) 20 MG/ML IV SOLN
INTRAVENOUS | Status: DC | PRN
  Administered 2014-07-20: 80 mg via INTRAVENOUS

## 2014-07-20 MED ORDER — LIDOCAINE-EPINEPHRINE 1 %-1:100000 IJ SOLN
INTRAMUSCULAR | Status: DC | PRN
  Administered 2014-07-20: 5 mL

## 2014-07-20 MED ORDER — ACETAMINOPHEN 325 MG PO TABS: 650.0000 mg | ORAL_TABLET | ORAL | Status: DC | PRN

## 2014-07-20 MED ORDER — NALOXONE HCL 0.4 MG/ML IJ SOLN
0.0800 mg | INTRAMUSCULAR | Status: DC | PRN
Start: 2014-07-20 — End: 2014-07-21

## 2014-07-20 MED ORDER — GLYCOPYRROLATE 0.2 MG/ML IJ SOLN
INTRAMUSCULAR | Status: DC | PRN
  Administered 2014-07-20: 0.6 mg via INTRAVENOUS

## 2014-07-20 MED ORDER — LABETALOL HCL 5 MG/ML IV SOLN: 10.0000 mg | INTRAVENOUS | Status: DC | PRN

## 2014-07-20 MED ORDER — PROPOFOL 10 MG/ML IV BOLUS
INTRAVENOUS | Status: AC
  Filled 2014-07-20: qty 20

## 2014-07-20 MED ORDER — CEFAZOLIN SODIUM 1-5 GM-% IV SOLN
1.0000 g | Freq: Three times a day (TID) | INTRAVENOUS | Status: AC
  Administered 2014-07-20 (×2): 1 g via INTRAVENOUS
  Filled 2014-07-20 (×2): qty 50

## 2014-07-20 MED ORDER — ROCURONIUM BROMIDE 100 MG/10ML IV SOLN
INTRAVENOUS | Status: DC | PRN
Start: 2014-07-20 — End: 2014-07-20
  Administered 2014-07-20: 40 mg via INTRAVENOUS

## 2014-07-20 MED ORDER — HYDROCODONE-ACETAMINOPHEN 5-325 MG PO TABS
1.0000 | ORAL_TABLET | ORAL | Status: DC | PRN
  Administered 2014-07-20: 1 via ORAL
  Filled 2014-07-20: qty 1

## 2014-07-20 MED ORDER — PROMETHAZINE HCL 25 MG/ML IJ SOLN
6.2500 mg | INTRAMUSCULAR | Status: DC | PRN
Start: 2014-07-20 — End: 2014-07-20

## 2014-07-20 SURGICAL SUPPLY — 57 items
BAG DECANTER FOR FLEXI CONT (MISCELLANEOUS) ×3 IMPLANT
BANDAGE GAUZE 4  KLING STR (GAUZE/BANDAGES/DRESSINGS) IMPLANT
BLADE CLIPPER SURG (BLADE) IMPLANT
BLADE SURG 11 STRL SS (BLADE) ×3 IMPLANT
BNDG COHESIVE 4X5 TAN NS LF (GAUZE/BANDAGES/DRESSINGS) IMPLANT
BRUSH SCRUB EZ PLAIN DRY (MISCELLANEOUS) ×3 IMPLANT
BUR ACORN 6.0 (BURR) ×2 IMPLANT
BUR ACORN 6.0MM (BURR) ×1
BUR ADDG 1.1 (BURR) IMPLANT
BUR ADDG 1.1MM (BURR)
CANISTER SUCT 3000ML PPV (MISCELLANEOUS) ×3 IMPLANT
CLIP TI MEDIUM 6 (CLIP) IMPLANT
CONT SPEC 4OZ CLIKSEAL STRL BL (MISCELLANEOUS) ×3 IMPLANT
CORDS BIPOLAR (ELECTRODE) ×3 IMPLANT
DECANTER SPIKE VIAL GLASS SM (MISCELLANEOUS) ×3 IMPLANT
DRAPE NEUROLOGICAL W/INCISE (DRAPES) ×2 IMPLANT
DRAPE WARM FLUID 44X44 (DRAPE) ×3 IMPLANT
ELECT CAUTERY BLADE 6.4 (BLADE) ×3 IMPLANT
ELECT REM PT RETURN 9FT ADLT (ELECTROSURGICAL) ×3
ELECTRODE REM PT RTRN 9FT ADLT (ELECTROSURGICAL) ×1 IMPLANT
GAUZE SPONGE 4X4 12PLY STRL (GAUZE/BANDAGES/DRESSINGS) ×2 IMPLANT
GAUZE SPONGE 4X4 16PLY XRAY LF (GAUZE/BANDAGES/DRESSINGS) IMPLANT
GLOVE BIOGEL PI IND STRL 8 (GLOVE) IMPLANT
GLOVE BIOGEL PI INDICATOR 8 (GLOVE) ×4
GLOVE ECLIPSE 7.5 STRL STRAW (GLOVE) ×6 IMPLANT
GLOVE ECLIPSE 9.0 STRL (GLOVE) ×6 IMPLANT
GOWN STRL REUS W/ TWL LRG LVL3 (GOWN DISPOSABLE) ×1 IMPLANT
GOWN STRL REUS W/ TWL XL LVL3 (GOWN DISPOSABLE) ×1 IMPLANT
GOWN STRL REUS W/TWL 2XL LVL3 (GOWN DISPOSABLE) ×3 IMPLANT
GOWN STRL REUS W/TWL LRG LVL3 (GOWN DISPOSABLE)
GOWN STRL REUS W/TWL XL LVL3 (GOWN DISPOSABLE) ×3
HEMOSTAT SURGICEL 2X14 (HEMOSTASIS) IMPLANT
HOOK DURA (MISCELLANEOUS) ×3 IMPLANT
KIT BASIN OR (CUSTOM PROCEDURE TRAY) ×3 IMPLANT
KIT ROOM TURNOVER OR (KITS) ×3 IMPLANT
LIQUID BAND (GAUZE/BANDAGES/DRESSINGS) ×3 IMPLANT
NDL HYPO 25X1 1.5 SAFETY (NEEDLE) ×1 IMPLANT
NEEDLE HYPO 25X1 1.5 SAFETY (NEEDLE) ×3 IMPLANT
NS IRRIG 1000ML POUR BTL (IV SOLUTION) ×3 IMPLANT
PACK CRANIOTOMY (CUSTOM PROCEDURE TRAY) ×3 IMPLANT
PAD ARMBOARD 7.5X6 YLW CONV (MISCELLANEOUS) ×9 IMPLANT
PATTIES SURGICAL .25X.25 (GAUZE/BANDAGES/DRESSINGS) IMPLANT
PATTIES SURGICAL .5 X.5 (GAUZE/BANDAGES/DRESSINGS) IMPLANT
PATTIES SURGICAL .5 X3 (DISPOSABLE) IMPLANT
PATTIES SURGICAL 1X1 (DISPOSABLE) IMPLANT
PIN MAYFIELD SKULL DISP (PIN) IMPLANT
SPONGE NEURO XRAY DETECT 1X3 (DISPOSABLE) IMPLANT
SPONGE SURGIFOAM ABS GEL 100 (HEMOSTASIS) IMPLANT
STAPLER VISISTAT 35W (STAPLE) ×3 IMPLANT
SUT NURALON 4 0 TR CR/8 (SUTURE) ×6 IMPLANT
SUT VIC AB 2-0 CT1 18 (SUTURE) ×3 IMPLANT
SYR 20ML ECCENTRIC (SYRINGE) ×3 IMPLANT
SYR CONTROL 10ML LL (SYRINGE) ×1 IMPLANT
TOWEL OR 17X24 6PK STRL BLUE (TOWEL DISPOSABLE) ×3 IMPLANT
TOWEL OR 17X26 10 PK STRL BLUE (TOWEL DISPOSABLE) ×3 IMPLANT
TRAY FOLEY W/METER SILVER 14FR (SET/KITS/TRAYS/PACK) IMPLANT
WATER STERILE IRR 1000ML POUR (IV SOLUTION) ×3 IMPLANT

## 2014-07-20 NOTE — Care Management Note (Signed)
Case Management Note  Patient Details  Name: MARKIEL BATTERSBY MRN: [id number] Date of Birth: 1950/06/02  Subjective/Objective:        Pt admitted on 07/20/14 with SDH s/p burr holes.  PTA, pt independent; resides at home with spouse.              Action/Plan: Will follow for discharge planning as pt progresses.  Expected Discharge Date:                  Expected Discharge Plan:  Home w Home Health Services  In-House Referral:     Discharge planning Services  CM Consult  Post Acute Care Choice:    Choice offered to:     DME Arranged:    DME Agency:     HH Arranged:    HH Agency:     Status of Service:  In process, will continue to follow  Medicare Important Message Given:    Date Medicare IM Given:    Medicare IM give by:    Date Additional Medicare IM Given:    Additional Medicare Important Message give by:     If discussed at Long Length of Stay Meetings, dates discussed:    Additional Comments:  Quintella Baton, RN, BSN  Trauma/Neuro ICU Case Manager [phone]

## 2014-07-20 NOTE — Anesthesia Preprocedure Evaluation (Addendum)
Anesthesia Evaluation  Patient identified by MRN, date of birth, ID band Patient awake    Reviewed: Allergy & Precautions, NPO status , Patient's Chart, lab work & pertinent test results  History of Anesthesia Complications Negative for: history of anesthetic complications  Airway Mallampati: II  TM Distance: >3 FB Neck ROM: Full    Dental  (+) Teeth Intact, Caps, Dental Advisory Given   Pulmonary sleep apnea ,    Pulmonary exam normal       Cardiovascular Normal cardiovascular exam+ dysrhythmias  S/P Ablation   Neuro/Psych Anxiety TIA   GI/Hepatic Neg liver ROS, GERD-  ,  Endo/Other  negative endocrine ROS  Renal/GU negative Renal ROS     Musculoskeletal   Abdominal   Peds  Hematology   Anesthesia Other Findings   Reproductive/Obstetrics                            Anesthesia Physical Anesthesia Plan  ASA: III  Anesthesia Plan: General   Post-op Pain Management:    Induction: Intravenous  Airway Management Planned: Oral ETT  Additional Equipment:   Intra-op Plan:   Post-operative Plan: Extubation in OR  Informed Consent: I have reviewed the patients History and Physical, chart, labs and discussed the procedure including the risks, benefits and alternatives for the proposed anesthesia with the patient or authorized representative who has indicated his/her understanding and acceptance.   Dental advisory given  Plan Discussed with: CRNA, Anesthesiologist and Surgeon  Anesthesia Plan Comments:        Anesthesia Quick Evaluation

## 2014-07-20 NOTE — H&P (Signed)
Gregg Guerrero is an 64 y.o. male.   Chief Complaint: Headache HPI: 64 year old male with history of progressive headaches. Workup has demonstrated evidence of a right-sided convexity subacute subdural hematoma. Patient with minimal history of trauma. No history of vascular disease. There was a question of a transient episode of CSF rhinorrhea but this is not persisted or felt to be terribly likely. Patient complains of fatigue generalized weakness and lack of energy. No seizures. No fevers.  Past Medical History  Diagnosis Date  . TIA (transient ischemic attack)   . Anxiety   . Sleep apnea     mild-no cpap needed  . History of TIA (transient ischemic attack)     hx of 2  . PFO (patent foramen ovale)   . Dysrhythmia     hx afib, with ablation to correct  . GERD (gastroesophageal reflux disease)   . Headache   . Arthritis     Past Surgical History  Procedure Laterality Date  . Cardiac electrophysiology study and ablation  2009    done at [facility]  . Tonsillectomy    . Colonoscopy    . Foot arthrotomy  2008    right foot   . Cheilectomy Left 02/03/2013    Procedure: LEFT HALLUX METATARSALPHALANGEAL JOINT CHEILECTOMY;  Surgeon: Toni Arthurs, MD;  Location: [facility];  Service: Orthopedics;  Laterality: Left;  . Shoulder surgery Left 2012    [facility] (Dr. Rennis Chris)  . Cardiac catheterization      2000    No family history on file. Social History:  reports that he has never smoked. He has never used smokeless tobacco. He reports that he drinks alcohol. He reports that he does not use illicit drugs.  Allergies: No Known Allergies  Medications Prior to Admission  Medication Sig Dispense Refill  . ascorbic acid (VITAMIN C) 250 MG tablet Take 500 mg by mouth daily.     Marland Kitchen aspirin 81 MG tablet Take 81 mg by mouth daily.    . cetirizine (ZYRTEC) 10 MG tablet Take 10 mg by mouth daily.    . Cholecalciferol (VITAMIN D) 2000 UNITS CAPS Take 2,000 Units by  mouth daily.     . clonazePAM (KLONOPIN) 1 MG tablet Take 1 mg by mouth at bedtime.    . clopidogrel (PLAVIX) 75 MG tablet Take 1 tablet (75 mg total) by mouth daily. 90 tablet 3  . doxylamine, Sleep, (UNISOM) 25 MG tablet Take 25 mg by mouth at bedtime as needed for sleep.    . Ferrous Sulfate (IRON) 325 (65 FE) MG TABS Take 325 mg by mouth daily.     . Omega-3 Fatty Acids (FISH OIL) 1000 MG CAPS Take 1 capsule by mouth daily.      No results found for this or any previous visit (from the past 48 hour(s)). Ct Head Wo Contrast  07/19/2014   CLINICAL DATA:  Preop head CT.  For bur hole drainage today.  EXAM: CT HEAD WITHOUT CONTRAST  TECHNIQUE: Contiguous axial images were obtained from the base of the skull through the vertex without intravenous contrast.  COMPARISON:  06/21/2014  FINDINGS: Skull and Sinuses:No acute fracture. No notable findings in advance of planned craniotomy. A 1 cm sclerotic focus in the posterior left parietal bone is stable from brain MRI in 2008 and thus benign.  Orbits: No acute abnormality.  Brain: Decreased thickness of a more hypodense subdural hematoma around the right cerebral convexity, now up to 12 mm thickness at the  level of the right parietal. No indication of interval hemorrhage.  Left subdural hematoma is no longer seen. Extra-axial CSF density on the left is stable from brain MRI in 2008.  No evidence of acute infarct, mass lesion, or hydrocephalus. No midline shift.  IMPRESSION: 1. Decreasing size and density of a right cerebral convexity subdural hematoma, now 12 mm maximal thickness. 2. No remaining left subdural hematoma identified. 3. No midline shift.   Electronically Signed   By: Marnee Spring M.D.   On: 07/19/2014 09:29    A comprehensive review of systems was negative.  Blood pressure 152/100, pulse 77, temperature 97.8 F (36.6 C), temperature source Oral, resp. rate 20, weight 91.173 kg (201 lb), SpO2 95 %.  The patient is awake and alert. He is  oriented and appropriate. Cranial nerve function is intact. Motor and sensory function extremities normal. Chest and abdomen benign. Extremities free from injury or deformity. Assessment/Plan Subacute right convexity subdural hematoma with moderate mass effect. Plan burr hole evacuation of subdural hematoma. Risks and benefits explained. Patient wishes to proceed.  Houda Brau A 07/20/2014, 7:44 AM

## 2014-07-20 NOTE — Transfer of Care (Signed)
Immediate Anesthesia Transfer of Care Note  Patient: Gregg Guerrero  Procedure(s) Performed: Procedure(s): Ezekiel Ina For Evacuation of SDH (Right)  Patient Location: PACU  Anesthesia Type:General  Level of Consciousness: awake, alert , oriented and patient cooperative  Airway & Oxygen Therapy: Patient Spontanous Breathing and Patient connected to nasal cannula oxygen  Post-op Assessment: Report given to RN, Post -op Vital signs reviewed and stable and Patient moving all extremities X 4  Post vital signs: Reviewed and stable  Last Vitals:  Filed Vitals:   07/20/14 0621  BP: 152/100  Pulse: 77  Temp: 36.6 C  Resp: 20    Complications: No apparent anesthesia complications

## 2014-07-20 NOTE — Anesthesia Postprocedure Evaluation (Signed)
Anesthesia Post Note  Patient: Gregg Guerrero  Procedure(s) Performed: Procedure(s) (LRB): [employer] For Evacuation of SDH (Right)  Anesthesia type: general  Patient location: PACU  Post pain: Pain level controlled  Post assessment: Patient's Cardiovascular Status Stable  Last Vitals:  Filed Vitals:   07/20/14 0931  BP:   Pulse:   Temp: 36.4 C  Resp:     Post vital signs: Reviewed and stable  Level of consciousness: sedated  Complications: No apparent anesthesia complications

## 2014-07-20 NOTE — Brief Op Note (Signed)
07/20/2014  8:38 AM  PATIENT:  Gregg Guerrero  64 y.o. male  PRE-OPERATIVE DIAGNOSIS:  Subdural Hematoma  POST-OPERATIVE DIAGNOSIS:  Subdural Hematoma  PROCEDURE:  Procedure(s): Ezekiel Ina For Evacuation of SDH (Right)  SURGEON:  Surgeon(s) and Role:    * Julio Sicks, MD - Primary  PHYSICIAN ASSISTANT:   ASSISTANTS:    ANESTHESIA:   general  EBL:     BLOOD ADMINISTERED:none  DRAINS: none   LOCAL MEDICATIONS USED:  LIDOCAINE   SPECIMEN:  No Specimen  DISPOSITION OF SPECIMEN:  N/A  COUNTS:  YES  TOURNIQUET:  * No tourniquets in log *  DICTATION: .Dragon Dictation  PLAN OF CARE: Admit to inpatient   PATIENT DISPOSITION:  PACU - hemodynamically stable.   Delay start of Pharmacological VTE agent (>24hrs) due to surgical blood loss or risk of bleeding: yes

## 2014-07-20 NOTE — Anesthesia Procedure Notes (Signed)
Procedure Name: Intubation Date/Time: 07/20/2014 8:01 AM Performed by: Suzy Bouchard Pre-anesthesia Checklist: Patient identified, Timeout performed, Emergency Drugs available, Suction available and Patient being monitored Patient Re-evaluated:Patient Re-evaluated prior to inductionOxygen Delivery Method: Circle system utilized Preoxygenation: Pre-oxygenation with 100% oxygen Intubation Type: IV induction Ventilation: Mask ventilation without difficulty Laryngoscope Size: Miller and 2 Grade View: Grade I Tube size: 7.5 mm Number of attempts: 1 Airway Equipment and Method: Stylet and LTA kit utilized Placement Confirmation: ETT inserted through vocal cords under direct vision,  breath sounds checked- equal and bilateral and positive ETCO2 Secured at: 22 cm Tube secured with: Tape Dental Injury: Teeth and Oropharynx as per pre-operative assessment

## 2014-07-20 NOTE — Op Note (Signed)
Date of procedure: 07/20/2014  Date of dictation: Same  Service: Neurosurgery  Preoperative diagnosis: Right convexity subacute subdural hematoma  Postoperative diagnosis: Same  Procedure Name: Right parietal bur hole with evacuation of subdural hematoma  Surgeon:Onie Kasparek A.Cleston Lautner, M.D.  Asst. Surgeon: None  Anesthesia: General  Indication: 64 year old male with headaches and fatigue. Workup demonstrates evidence of a subacute subdural hematoma overlying the right convexity. Patient presents now for burr hole evacuation.  Operative note: After induction of anesthesia, patient position supine with his head turned toward the left and placed in a doughnut head holder. Patient's right scalp prepped and draped sterilely. Lidocaine infiltrated into the planned incision site. A single incision made approximately 5 cm above the pinna of the right ear which localized to the area of maximal thickness on the preoperative CT scan. This carried down sharply to the paracranium. Retractor placed. Bur hole made using high-speed drill. Dura coagulated. Dura incised. Blood clot released under pressure. The blood clot drain spontaneously and was completely expressed. I used Penfield instruments to gently push down the brain around the bur hole to make sure there was no loculated clot remaining. At this point I felt very confident that the blood clot itself and been drained and I saw no evidence of bleeding or other complication. Wound is then irrigated with and bike solution. A small piece of Gelfoam was placed over the bur hole site. Wounds and closed with 20 Vicryls sutures at the galea and staples at the surface. There were no apparent Complications. Patient tolerated the procedure well and he returns to the recovery room postop.

## 2014-07-21 ENCOUNTER — Encounter (HOSPITAL_COMMUNITY): Payer: Self-pay | Admitting: Neurosurgery

## 2014-07-21 MED ORDER — HYDROCODONE-ACETAMINOPHEN 5-325 MG PO TABS: 1.0000 | ORAL_TABLET | ORAL | Status: DC | PRN

## 2014-07-21 MED ORDER — CLOPIDOGREL BISULFATE 75 MG PO TABS: 75.0000 mg | ORAL_TABLET | Freq: Every day | ORAL | Status: DC

## 2014-07-21 NOTE — Discharge Summary (Signed)
Physician Discharge Summary  Patient ID: Gregg Guerrero MRN: [id number] DOB/AGE: 64-Mar-1952 64 y.o.  Admit date: 07/20/2014 Discharge date: 07/21/2014  Admission Diagnoses:  Discharge Diagnoses:  Principal Problem:   Subdural hematoma   Discharged Condition: good  Hospital Course: Patient was admitted to the hospital where he underwent an uncomplicated right parietal burr hole evacuation of subacute subdural hematoma. Postoperatively he is doing very well. He's having no significant headache. He is having no new numbness, paresthesias or weakness. He feels ready for discharge home.  Consults:   Significant Diagnostic Studies:   Treatments:   Discharge Exam: Blood pressure 137/86, pulse 88, temperature 98.2 F (36.8 C), temperature source Oral, resp. rate 21, height 6\' 1"  (1.854 m), weight 91.8 kg (202 lb 6.1 oz), SpO2 95 %. Awake and alert. Oriented and appropriate. Motor and sensory function intact. Wound clean and dry. Chest and abdomen benign.  Disposition: 01-Home or Self Care     Medication List    TAKE these medications        ascorbic acid 250 MG tablet  Commonly known as:  VITAMIN C  Take 500 mg by mouth daily.     aspirin 81 MG tablet  Take 81 mg by mouth daily.     cetirizine 10 MG tablet  Commonly known as:  ZYRTEC  Take 10 mg by mouth daily.     clonazePAM 1 MG tablet  Commonly known as:  KLONOPIN  Take 1 mg by mouth at bedtime.     clopidogrel 75 MG tablet  Commonly known as:  PLAVIX  Take 1 tablet (75 mg total) by mouth daily.     doxylamine (Sleep) 25 MG tablet  Commonly known as:  UNISOM  Take 25 mg by mouth at bedtime as needed for sleep.     Fish Oil 1000 MG Caps  Take 1 capsule by mouth daily.     HYDROcodone-acetaminophen 5-325 MG per tablet  Commonly known as:  NORCO/VICODIN  Take 1 tablet by mouth every 4 (four) hours as needed for moderate pain.     Iron 325 (65 FE) MG Tabs  Take 325 mg by mouth daily.     Vitamin D 2000  UNITS Caps  Take 2,000 Units by mouth daily.           Follow-up Information    Follow up with Wing Schoch A, MD In 1 week.   Specialty:  Neurosurgery   Contact information:   [address] [name] [address] [phone]       Signed: Temple Pacini 07/21/2014, 7:57 AM

## 2014-07-21 NOTE — Progress Notes (Signed)
Discharge and medication instructions given to patient. No questions or concerns expressed by patient.  Patient discharged home

## 2014-07-24 ENCOUNTER — Ambulatory Visit: Payer: 59 | Admitting: Diagnostic Neuroimaging

## 2014-07-25 ENCOUNTER — Encounter: Payer: Self-pay | Admitting: Diagnostic Neuroimaging

## 2014-08-18 ENCOUNTER — Other Ambulatory Visit (HOSPITAL_COMMUNITY): Payer: Self-pay | Admitting: Neurosurgery

## 2014-08-18 DIAGNOSIS — S065XAA Traumatic subdural hemorrhage with loss of consciousness status unknown, initial encounter: Secondary | ICD-10-CM

## 2014-08-18 DIAGNOSIS — S065X9A Traumatic subdural hemorrhage with loss of consciousness of unspecified duration, initial encounter: Secondary | ICD-10-CM

## 2014-08-23 ENCOUNTER — Ambulatory Visit (HOSPITAL_COMMUNITY)
Admission: RE | Admit: 2014-08-23 | Discharge: 2014-08-23 | Disposition: A | Discharge status: Home / Self Care | Payer: 59 | Source: Ambulatory Visit | Attending: Neurosurgery | Admitting: Neurosurgery

## 2014-08-23 DIAGNOSIS — S065XAA Traumatic subdural hemorrhage with loss of consciousness status unknown, initial encounter: Secondary | ICD-10-CM

## 2014-08-23 DIAGNOSIS — X58XXXD Exposure to other specified factors, subsequent encounter: Secondary | ICD-10-CM | POA: Insufficient documentation

## 2014-08-23 DIAGNOSIS — S065X9A Traumatic subdural hemorrhage with loss of consciousness of unspecified duration, initial encounter: Secondary | ICD-10-CM

## 2014-08-23 DIAGNOSIS — S065X0D Traumatic subdural hemorrhage without loss of consciousness, subsequent encounter: Secondary | ICD-10-CM | POA: Diagnosis not present

## 2014-08-23 DIAGNOSIS — Z9889 Other specified postprocedural states: Secondary | ICD-10-CM | POA: Insufficient documentation

## 2014-12-28 DIAGNOSIS — S060XAA Concussion with loss of consciousness status unknown, initial encounter: Secondary | ICD-10-CM

## 2014-12-28 DIAGNOSIS — S060X9A Concussion with loss of consciousness of unspecified duration, initial encounter: Secondary | ICD-10-CM

## 2014-12-28 HISTORY — DX: Concussion with loss of consciousness of unspecified duration, initial encounter: S06.0X9A

## 2014-12-28 HISTORY — DX: Concussion with loss of consciousness status unknown, initial encounter: S06.0XAA

## 2015-01-08 ENCOUNTER — Other Ambulatory Visit: Payer: Self-pay | Admitting: Neurosurgery

## 2015-01-08 DIAGNOSIS — S0990XS Unspecified injury of head, sequela: Secondary | ICD-10-CM

## 2015-01-09 ENCOUNTER — Ambulatory Visit
Admission: RE | Admit: 2015-01-09 | Discharge: 2015-01-09 | Disposition: A | Discharge status: Home / Self Care | Payer: Worker's Compensation | Source: Ambulatory Visit | Attending: Neurosurgery | Admitting: Neurosurgery

## 2015-01-09 DIAGNOSIS — S0990XS Unspecified injury of head, sequela: Secondary | ICD-10-CM

## 2015-05-21 ENCOUNTER — Encounter: Payer: Self-pay | Admitting: Diagnostic Neuroimaging

## 2015-05-21 ENCOUNTER — Encounter: Payer: Self-pay | Admitting: *Deleted

## 2015-05-22 ENCOUNTER — Encounter: Payer: Self-pay | Admitting: Diagnostic Neuroimaging

## 2015-05-22 ENCOUNTER — Ambulatory Visit (INDEPENDENT_AMBULATORY_CARE_PROVIDER_SITE_OTHER): Payer: 59 | Admitting: Diagnostic Neuroimaging

## 2015-05-22 VITALS — BP 125/80 | HR 85 | Wt 204.0 lb

## 2015-05-22 DIAGNOSIS — R42 Dizziness and giddiness: Secondary | ICD-10-CM

## 2015-05-22 DIAGNOSIS — F0781 Postconcussional syndrome: Secondary | ICD-10-CM

## 2015-05-22 DIAGNOSIS — G44321 Chronic post-traumatic headache, intractable: Secondary | ICD-10-CM

## 2015-05-22 DIAGNOSIS — G4730 Sleep apnea, unspecified: Secondary | ICD-10-CM

## 2015-05-22 NOTE — Patient Instructions (Signed)

## 2015-05-22 NOTE — Progress Notes (Signed)
[facility]  PATIENT: Gregg Guerrero DOB: 05/14/1950  REFERRING CLINICIAN: Hyacinth MeekerMiller  HISTORY FROM: patient REASON FOR VISIT: follow up    HISTORICAL  CHIEF COMPLAINT:  Chief Complaint  Patient presents with  . SUbdural hematoma    rm 7, "review headaches-primarily a cough HA; concussion 12/2014"  . Follow-up    yearly    HISTORY OF PRESENT ILLNESS:   UPDATE 05/22/15: Since last visit, had another concussion (work related) at [employer] running up stairs and hit over-head concrete beam. No LOC, but had significant stun/concussion symptoms. Then went to see Dr. Jordan LikesPool (CT head was negative). Then went to Dr. Vela Proselewitt (head specialist) for workman's comp HA treatment for the Dec 2016 event. Now returns to re-establish for our prior evaluation and follow up of recent MRI brain scan 6 weeks ago. Continues with HA, dizziness, exertion HA, and neck pain issues. No more significant nasal drainage, but occ notes in the early morning.   UPDATE 06/20/14: Since last visit, wife thinks general mood and energy level is down. MRI brain on Friday showed subdural hematomas (mixed ages). Also notes clear nasal drainage (left nostril) since Feb 2016, usually in the early AM. Headaches have continued. No headaches today, but they have been fluctuating. Also developed hearing loss 3 weeks ago with ringing in ears.  PRIOR HPI (04/18/14): 65 year old right-handed male here for evaluation of headaches, abnormal MRI, with history of TIAs. December 2015 patient was in the attic, removing Christmas decorations, when he struck his head on overlying rafters several times. He think he hit his head 3 times. He did not get knocked unconscious. He had significant pain on the top of his head with "knot swelling" over the site of impact. Around January 20 patient had another episode of hitting his head, while getting into the passenger side of the truck. He did not note any significant pain  initially. However within 15 minutes he had severe neck pain. One to 2 weeks later he developed onset of intermittent headaches, especially triggered by sneezing, coughing, bending or straining. Headaches have been intermittent, with worse headaches around March 28, 2014. Over the last 2 weeks headaches have spontaneously improved. Patient has history of migraine phenomenon without headaches, specifically seeing static, spots, scintillating scotoma, associated with photophobia, phonophobia, malaise. Usually he has to lay down and symptoms resolve. He never has a significant headache with these episodes. These occur one to 2 times per month. Recently patient went to PCP for evaluation. MRI of the brain and MRA of the head were performed, which show diffuse dural enhancement, raising possibility of intracranial hypotension. Otherwise scans were unremarkable. Patient previously had TIA diagnoses in 1998 in 2000. In 1998 patient had onset of right facial droop lasting for 10-20 minutes and then resolved. In 2000, patient had second episode where he was mute for and to 20 minutes without other focal numbness or weakness. Interestingly he was able to write normally with a pen and communicate even though he was not able to communicate verbally. Patient has had PFO diagnosed previously. No DVT was found at that time. Patient then diagnosed with atrial fibrillation, status post ablation. Patient currently managed with risk factor reduction and Plavix.    REVIEW OF SYSTEMS: Full 14 system review of systems performed and negative except: fatigue activity change ringing in ears apnea snoring.    ALLERGIES: No Known Allergies  HOME MEDICATIONS: Outpatient Prescriptions Prior to Visit  Medication Sig Dispense Refill  . ascorbic acid (VITAMIN C)  250 MG tablet Take 500 mg by mouth daily.     . cetirizine (ZYRTEC) 10 MG tablet Take 10 mg by mouth daily.    . Cholecalciferol (VITAMIN D) 2000 UNITS CAPS Take 2,000 Units  by mouth daily.     . clonazePAM (KLONOPIN) 1 MG tablet Take 1 mg by mouth at bedtime.    . clopidogrel (PLAVIX) 75 MG tablet Take 1 tablet (75 mg total) by mouth daily. 90 tablet 3  . doxylamine, Sleep, (UNISOM) 25 MG tablet Take 25 mg by mouth at bedtime as needed for sleep.    . Ferrous Sulfate (IRON) 325 (65 FE) MG TABS Take 325 mg by mouth daily.     Marland Kitchen aspirin 81 MG tablet Take 81 mg by mouth daily.    Marland Kitchen HYDROcodone-acetaminophen (NORCO/VICODIN) 5-325 MG per tablet Take 1 tablet by mouth every 4 (four) hours as needed for moderate pain. 30 tablet 0  . Omega-3 Fatty Acids (FISH OIL) 1000 MG CAPS Take 1 capsule by mouth daily.     No facility-administered medications prior to visit.    PAST MEDICAL HISTORY: Past Medical History  Diagnosis Date  . TIA (transient ischemic attack)   . Anxiety   . Sleep apnea     mild-no cpap needed  . History of TIA (transient ischemic attack)     hx of 2  . PFO (patent foramen ovale)   . Dysrhythmia     hx afib, with ablation to correct  . GERD (gastroesophageal reflux disease)   . Headache   . Arthritis   . Migraine     hx of  . RLS (restless legs syndrome)   . Concussion 12/2014    PAST SURGICAL HISTORY: Past Surgical History  Procedure Laterality Date  . Cardiac electrophysiology study and ablation  2009    done at [facility]  . Tonsillectomy    . Colonoscopy    . Foot arthrotomy  2008    right foot   . Cheilectomy Left 02/03/2013    Procedure: LEFT HALLUX METATARSALPHALANGEAL JOINT CHEILECTOMY;  Surgeon: Toni Arthurs, MD;  Location: [facility];  Service: Orthopedics;  Laterality: Left;  . Shoulder surgery Left 2012    [facility] (Dr. Rennis Chris)  . Cardiac catheterization      2000  . Ines Bloomer hole Right 07/20/2014    Procedure: Ezekiel Ina For Evacuation of SDH;  Surgeon: Julio Sicks, MD;  Location: [facility];  Service: Neurosurgery;  Laterality: Right;    FAMILY HISTORY: History reviewed. No pertinent family  history.  SOCIAL HISTORY:  Social History   Social History  . Marital Status: Married    Spouse Name: Eunice Blase  . Number of Children: 1  . Years of Education: College    Occupational History  .     Social History Main Topics  . Smoking status: Never Smoker   . Smokeless tobacco: Never Used  . Alcohol Use: Yes     Comment: rare  . Drug Use: No  . Sexual Activity: Not on file   Other Topics Concern  . Not on file   Social History Narrative   Patient lives at home with wife.    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    Patient has a college education.    Patient has 1 child.    Patient quit caffeine use 2005.   Patient is right handed.      PHYSICAL EXAM  Filed Vitals:   05/22/15 1511  BP: 125/80  Pulse: 85  Weight: 204 lb (92.534 kg)    Body mass index is 26.92 kg/(m^2).  No exam data present  No flowsheet data found.  GENERAL EXAM: Patient is in no distress; well developed, nourished and groomed; neck is supple  CARDIOVASCULAR: Regular rate and rhythm, no murmurs, no carotid bruits  NEUROLOGIC: MENTAL STATUS: awake, alert, language fluent, comprehension intact, naming intact, fund of knowledge appropriate CRANIAL NERVE: no papilledema on fundoscopic exam, pupils equal and reactive to light, visual fields full to confrontation, extraocular muscles intact, no nystagmus, facial sensation and strength symmetric, hearing intact, palate elevates symmetrically, uvula midline, shoulder shrug symmetric, tongue midline. MOTOR: normal bulk and tone, full strength in the BUE, BLE SENSORY: normal and symmetric to light touch, pinprick, temperature, vibration; NO EXTINCTION COORDINATION: finger-nose-finger, fine finger movements normal REFLEXES: deep tendon reflexes present and symmetric GAIT/STATION: narrow based gait; able to walk on toes, heels and tandem; romberg is negative    DIAGNOSTIC DATA (LABS, IMAGING, TESTING) - I reviewed patient records,  labs, notes, testing and imaging myself where available.  Lab Results  Component Value Date   WBC 6.2 07/11/2014   HGB 16.4 07/11/2014   HCT 48.4 07/11/2014   MCV 86.7 07/11/2014   PLT 207 07/11/2014      Component Value Date/Time   NA 139 07/11/2014 1507   K 4.6 07/11/2014 1507   CL 106 07/11/2014 1507   CO2 26 07/11/2014 1507   GLUCOSE 109* 07/11/2014 1507   BUN 19 07/11/2014 1507   CREATININE 1.12 07/11/2014 1507   CREATININE 1.04 04/05/2014 1622   CALCIUM 9.0 07/11/2014 1507   PROT 6.2 12/04/2006 1521   ALBUMIN 3.7 12/04/2006 1521   AST 21 12/04/2006 1521   ALT 25 12/04/2006 1521   ALKPHOS 61 12/04/2006 1521   BILITOT 0.8 12/04/2006 1521   GFRNONAA >60 07/11/2014 1507   GFRAA >60 07/11/2014 1507   No results found for: CHOL, HDL, LDLCALC, LDLDIRECT, TRIG, CHOLHDL No results found for: ZOXW9U No results found for: VITAMINB12 Lab Results  Component Value Date   TSH 0.908 *Test methodology is 3rd generation TSH* 12/04/2006    04/07/14 MRI brain [I reviewed images myself and agree with interpretation. -VRP]  1. Mild, diffuse dural enhancement. No other signs of intracranial hypotension are clearly identified, however if this is a clinical consideration correlation with lumbar puncture and opening pressure may be informative. 2. Chronic left cerebellar infarct, unchanged.  04/07/14 MRA head [I reviewed images myself and agree with interpretation. -VRP]  - No major intracranial arterial occlusion. Mild bilateral supraclinoid ICA narrowing.  06/16/14 MRI brain [I reviewed images myself and agree with interpretation. -VRP]  1. New, small bilateral subdural hematomas, right larger than left. No midline shift. 2. Increased, moderate diffuse dural thickening and enhancement. Considerations include intracranial hypotension, reactive dural thickening due to subdural hemorrhage, infection, and neoplasm.   March 2017 MRI brain [I reviewed images myself and agree with  interpretation. -VRP]  - significant improvement in subdural hematoma - significant improvement in dural enhancement - chronic left cerebellar infarct (stable)     ASSESSMENT AND PLAN  65 y.o. year old male here with history of atrial fibrillation status post ablation, TIA 2, PFO, migraine phenomenon, restless legs, vitamin D deficiency, hyper lipidemia, here for evaluation of intermittent headaches and abnormal MRI brain (initially mild diffuse dural enhancement; follow up shows mixed age subdural hemorrhages).   His history of multiple mild/minor head traumas, which could be a factor in  developing a CSF leak (? Dec 2015 - Jan 2016), especially now that he mentions clear drainage from the left nostil, which could represent traumatic CSF rhinorrhea. The low CSF pressure in combination with dual anti-platelet could have then triggered the multiple subdural hemorrhages (may have occurred b/w Mar and May 2016), as well as the dural enhancement. Other causes would include infection, inflammation and neoplastic causes, but I feel these are less likely.   Now s/p another concussion Dec 2016, with lingering HA, dizziness, neck pain (getting some treatments with Dr. Vela Prose).   Dx:  Post concussion syndrome - Plan: Ambulatory referral to Physical Therapy  Intractable chronic post-traumatic headache - Plan: Ambulatory referral to Physical Therapy  Dizziness and giddiness - Plan: Ambulatory referral to Physical Therapy  Sleep apnea - Plan: Ambulatory referral to Physical Therapy     PLAN: - refer to integrative therapies for post-concussion syndrome / neck pain - continue plavix for stroke prevention  Return in about 6 months (around 11/21/2015).    Suanne Marker, MD 05/22/2015, 4:43 PM Certified in Neurology, Neurophysiology and Neuroimaging  [facility] [address] [place], [name] [address] [phone]

## 2015-08-15 ENCOUNTER — Telehealth: Payer: Self-pay | Admitting: Diagnostic Neuroimaging

## 2015-08-15 MED ORDER — CLOPIDOGREL BISULFATE 75 MG PO TABS: 75.0000 mg | ORAL_TABLET | Freq: Every day | ORAL | Status: DC

## 2015-08-15 NOTE — Telephone Encounter (Signed)
Pt is requesting refill for Plavix. He has 4 days left. Does he need to continue taking it? [facility] [address]. Please call to advise

## 2015-08-15 NOTE — Telephone Encounter (Signed)
Spoke with patient and informed him that refill for plavix was e scribed to requested drug store, advised he call before he goes to pick it up. Reminded him of his FU Oct 2017. He verbalized understanding, appreciation.

## 2015-08-16 ENCOUNTER — Telehealth: Payer: Self-pay | Admitting: *Deleted

## 2015-08-16 NOTE — Telephone Encounter (Signed)
Received PA request from [facility] for clopid[facility]rel. [facility] , was transferred to workman's comp. Spoke w/Reanna who stated this medicaiton was denied by the patient's workman's comp adjuster. She stated the patient will have to call his adjuster and ask for approval.   Called patient and LVM informing him of above conversation. Left name and number for questions.

## 2015-08-16 NOTE — Telephone Encounter (Signed)
Pt called to notify the nurse his pharmacy is going to fax a form over to be signed and sent back. It is to help it get approved by his insurance. He has enough medication to last until Sunday.

## 2015-08-20 NOTE — Telephone Encounter (Signed)
Spoke with patient who stated he got message last week, re: clopidogrel and workman's comp. He stated he called his pharmacist last week who stated this med was not related at all to his workman's comp. The pharmacist refilled his medication,  and he picked it up.  He verbalized appreciation for follow up call.

## 2015-11-21 ENCOUNTER — Encounter: Payer: Self-pay | Admitting: Diagnostic Neuroimaging

## 2015-11-21 ENCOUNTER — Ambulatory Visit (INDEPENDENT_AMBULATORY_CARE_PROVIDER_SITE_OTHER): Payer: Commercial Managed Care - HMO | Admitting: Diagnostic Neuroimaging

## 2015-11-21 VITALS — BP 128/79 | HR 83 | Wt 207.2 lb

## 2015-11-21 DIAGNOSIS — I6200 Nontraumatic subdural hemorrhage, unspecified: Secondary | ICD-10-CM

## 2015-11-21 DIAGNOSIS — G9389 Other specified disorders of brain: Secondary | ICD-10-CM

## 2015-11-21 DIAGNOSIS — R42 Dizziness and giddiness: Secondary | ICD-10-CM | POA: Diagnosis not present

## 2015-11-21 DIAGNOSIS — G459 Transient cerebral ischemic attack, unspecified: Secondary | ICD-10-CM | POA: Diagnosis not present

## 2015-11-21 DIAGNOSIS — S065X9A Traumatic subdural hemorrhage with loss of consciousness of unspecified duration, initial encounter: Secondary | ICD-10-CM

## 2015-11-21 DIAGNOSIS — G4483 Primary cough headache: Secondary | ICD-10-CM

## 2015-11-21 DIAGNOSIS — G96810 Intracranial hypotension, unspecified: Secondary | ICD-10-CM

## 2015-11-21 DIAGNOSIS — S065XAA Traumatic subdural hemorrhage with loss of consciousness status unknown, initial encounter: Secondary | ICD-10-CM

## 2015-11-21 DIAGNOSIS — G2581 Restless legs syndrome: Secondary | ICD-10-CM | POA: Insufficient documentation

## 2015-11-21 MED ORDER — CLOPIDOGREL BISULFATE 75 MG PO TABS: 75.0000 mg | ORAL_TABLET | Freq: Every day | ORAL | 4 refills | Status: DC

## 2015-11-21 NOTE — Progress Notes (Signed)
[facility]  PATIENT: Gregg Guerrero DOB: Dec 30, 1950  REFERRING CLINICIAN: Hyacinth Meeker HISTORY FROM: patient REASON FOR VISIT: follow up   HISTORICAL  CHIEF COMPLAINT:  Chief Complaint  Patient presents with  . Post concussion syndrome    rm 7, "still have primary cough/sneeze HA; integrative therapies helping"  . Follow-up    6 month    HISTORY OF PRESENT ILLNESS:   UPDATE 11/21/15: Since last visit, symptoms are improving. Concussion effect has improved. Cough HA are stable. Integrative therapy treatments are going well.   UPDATE 05/22/15: Since last visit, had another concussion (work related) at [employer] running up stairs and hit over-head concrete beam. No LOC, but had significant stun/concussion symptoms. Then went to see Dr. Jordan Likes (CT head was negative). Then went to Dr. Vela Prose (head specialist) for workman's comp HA treatment for the Dec 2016 event. Now returns to re-establish for our prior evaluation and follow up of recent MRI brain scan 6 weeks ago. Continues with HA, dizziness, exertion HA, and neck pain issues. No more significant nasal drainage, but occ notes in the early morning.   UPDATE 06/20/14: Since last visit, wife thinks general mood and energy level is down. MRI brain on Friday showed subdural hematomas (mixed ages). Also notes clear nasal drainage (left nostril) since Feb 2016, usually in the early AM. Headaches have continued. No headaches today, but they have been fluctuating. Also developed hearing loss 3 weeks ago with ringing in ears.  PRIOR HPI (04/18/14): 65 year old right-handed male here for evaluation of headaches, abnormal MRI, with history of TIAs. December 2015 patient was in the attic, removing Christmas decorations, when he struck his head on overlying rafters several times. He think he hit his head 3 times. He did not get knocked unconscious. He had significant pain on the top of his head with "knot swelling" over the  site of impact. Around January 20 patient had another episode of hitting his head, while getting into the passenger side of the truck. He did not note any significant pain initially. However within 15 minutes he had severe neck pain. One to 2 weeks later he developed onset of intermittent headaches, especially triggered by sneezing, coughing, bending or straining. Headaches have been intermittent, with worse headaches around March 28, 2014. Over the last 2 weeks headaches have spontaneously improved. Patient has history of migraine phenomenon without headaches, specifically seeing static, spots, scintillating scotoma, associated with photophobia, phonophobia, malaise. Usually he has to lay down and symptoms resolve. He never has a significant headache with these episodes. These occur one to 2 times per month. Recently patient went to PCP for evaluation. MRI of the brain and MRA of the head were performed, which show diffuse dural enhancement, raising possibility of intracranial hypotension. Otherwise scans were unremarkable. Patient previously had TIA diagnoses in 1998 in 2000. In 1998 patient had onset of right facial droop lasting for 10-20 minutes and then resolved. In 2000, patient had second episode where he was mute for and to 20 minutes without other focal numbness or weakness. Interestingly he was able to write normally with a pen and communicate even though he was not able to communicate verbally. Patient has had PFO diagnosed previously. No DVT was found at that time. Patient then diagnosed with atrial fibrillation, status post ablation. Patient currently managed with risk factor reduction and Plavix.   REVIEW OF SYSTEMS: Full 14 system review of systems performed and negative except: fatigue activity change ringing in ears apnea snoring.  ALLERGIES: No Known Allergies  HOME MEDICATIONS: Outpatient Medications Prior to Visit  Medication Sig Dispense Refill  . cetirizine (ZYRTEC) 10 MG tablet  Take 10 mg by mouth daily.    . Cholecalciferol (VITAMIN D) 2000 UNITS CAPS Take 2,000 Units by mouth daily.     . clonazePAM (KLONOPIN) 1 MG tablet Take 1 mg by mouth at bedtime.    . clopidogrel (PLAVIX) 75 MG tablet Take 1 tablet (75 mg total) by mouth daily. 90 tablet 1  . doxylamine, Sleep, (UNISOM) 25 MG tablet Take 25 mg by mouth at bedtime as needed for sleep.    . Ferrous Sulfate (IRON) 325 (65 FE) MG TABS Take 325 mg by mouth daily.     . fluticasone (FLONASE) 50 MCG/ACT nasal spray Place into both nostrils daily.    . Multiple Vitamin (MULTIVITAMIN) tablet Take 1 tablet by mouth daily.    . Vit B6-Vit B12-Omega 3 Acids (VITAMIN B PLUS+ PO) Take by mouth. Unsure of which vit B    . ascorbic acid (VITAMIN C) 250 MG tablet Take 500 mg by mouth daily.     Marland Kitchen ketoprofen (ORUDIS) 75 MG capsule 75 mg. As needed  0  . topiramate (TOPAMAX) 25 MG tablet 25 mg daily.  1   No facility-administered medications prior to visit.     PAST MEDICAL HISTORY: Past Medical History:  Diagnosis Date  . Anxiety   . Arthritis   . Concussion 12/2014  . Dysrhythmia    hx afib, with ablation to correct  . GERD (gastroesophageal reflux disease)   . Headache   . History of TIA (transient ischemic attack)    hx of 2  . Migraine    hx of  . PFO (patent foramen ovale)   . RLS (restless legs syndrome)   . Sleep apnea    mild-no cpap needed  . TIA (transient ischemic attack)     PAST SURGICAL HISTORY: Past Surgical History:  Procedure Laterality Date  . BURR HOLE Right 07/20/2014   Procedure: Ezekiel Ina For Evacuation of SDH;  Surgeon: Julio Sicks, MD;  Location: [facility];  Service: Neurosurgery;  Laterality: Right;  . CARDIAC CATHETERIZATION     2000  . CARDIAC ELECTROPHYSIOLOGY STUDY AND ABLATION  2009   done at [facility]  . CHEILECTOMY Left 02/03/2013   Procedure: LEFT HALLUX METATARSALPHALANGEAL JOINT CHEILECTOMY;  Surgeon: Toni Arthurs, MD;  Location: [facility];  Service:  Orthopedics;  Laterality: Left;  . COLONOSCOPY    . FOOT ARTHROTOMY  2008   right foot   . SHOULDER SURGERY Left 2012   [facility] (Dr. Rennis Chris)  . TONSILLECTOMY      FAMILY HISTORY: Family History  Problem Relation Age of Onset  . Congestive Heart Failure Father   . Stroke Mother     SOCIAL HISTORY:  Social History   Social History  . Marital status: Married    Spouse name: Eunice Blase  . Number of children: 1  . Years of education: College    Occupational History  .  [employer]   Social History Main Topics  . Smoking status: Never Smoker  . Smokeless tobacco: Never Used  . Alcohol use Yes     Comment: rare  . Drug use: No  . Sexual activity: Not on file   Other Topics Concern  . Not on file   Social History Narrative   Patient lives at home with wife.    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    Patient has a college education.    Patient has 1 child.    Patient quit caffeine use 2005.   Patient is right handed.      PHYSICAL EXAM  Vitals:   11/21/15 1345  BP: 128/79  Pulse: 83  Weight: 207 lb 3.2 oz (94 kg)    Body mass index is 27.34 kg/m.  No exam data present  No flowsheet data found.  GENERAL EXAM: Patient is in no distress; well developed, nourished and groomed; neck is supple  CARDIOVASCULAR: Regular rate and rhythm, no murmurs, no carotid bruits  NEUROLOGIC: MENTAL STATUS: awake, alert, language fluent, comprehension intact, naming intact, fund of knowledge appropriate CRANIAL NERVE: no papilledema on fundoscopic exam, pupils equal and reactive to light, visual fields full to confrontation, extraocular muscles intact, no nystagmus, facial sensation and strength symmetric, hearing intact, palate elevates symmetrically, uvula midline, shoulder shrug symmetric, tongue midline. MOTOR: normal bulk and tone, full strength in the BUE, BLE SENSORY: normal and symmetric to light touch, pinprick, temperature,  vibration; NO EXTINCTION COORDINATION: finger-nose-finger, fine finger movements normal REFLEXES: deep tendon reflexes present and symmetric GAIT/STATION: narrow based gait; romberg is negative    DIAGNOSTIC DATA (LABS, IMAGING, TESTING) - I reviewed patient records, labs, notes, testing and imaging myself where available.  Lab Results  Component Value Date   WBC 6.2 07/11/2014   HGB 16.4 07/11/2014   HCT 48.4 07/11/2014   MCV 86.7 07/11/2014   PLT 207 07/11/2014      Component Value Date/Time   NA 139 07/11/2014 1507   K 4.6 07/11/2014 1507   CL 106 07/11/2014 1507   CO2 26 07/11/2014 1507   GLUCOSE 109 (H) 07/11/2014 1507   BUN 19 07/11/2014 1507   CREATININE 1.12 07/11/2014 1507   CREATININE 1.04 04/05/2014 1622   CALCIUM 9.0 07/11/2014 1507   PROT 6.2 12/04/2006 1521   ALBUMIN 3.7 12/04/2006 1521   AST 21 12/04/2006 1521   ALT 25 12/04/2006 1521   ALKPHOS 61 12/04/2006 1521   BILITOT 0.8 12/04/2006 1521   GFRNONAA >60 07/11/2014 1507   GFRAA >60 07/11/2014 1507   No results found for: CHOL, HDL, LDLCALC, LDLDIRECT, TRIG, CHOLHDL No results found for: ZOXW9UHGBA1C No results found for: VITAMINB12 Lab Results  Component Value Date   TSH 0.908 *Test methodology is 3rd generation TSH* 12/04/2006    04/07/14 MRI brain [I reviewed images myself and agree with interpretation. -VRP]  1. Mild, diffuse dural enhancement. No other signs of intracranial hypotension are clearly identified, however if this is a clinical consideration correlation with lumbar puncture and opening pressure may be informative. 2. Chronic left cerebellar infarct, unchanged.  04/07/14 MRA head [I reviewed images myself and agree with interpretation. -VRP]  - No major intracranial arterial occlusion. Mild bilateral supraclinoid ICA narrowing.  06/16/14 MRI brain [I reviewed images myself and agree with interpretation. -VRP]  1. New, small bilateral subdural hematomas, right larger than left. No midline  shift. 2. Increased, moderate diffuse dural thickening and enhancement. Considerations include intracranial hypotension, reactive dural thickening due to subdural hemorrhage, infection, and neoplasm.   01/09/15 CT head [I reviewed images myself and agree with interpretation. -VRP]  1. Resolved small right subdural hematoma since July. 2. No new intracranial abnormality, negative noncontrast CT appearance of the brain.  March 2017 MRI brain [I reviewed images myself and agree with interpretation. -VRP]  - significant improvement in subdural hematoma - significant improvement in dural enhancement -  chronic left cerebellar infarct (stable)     ASSESSMENT AND PLAN  65 y.o. year old male here with history of atrial fibrillation status post ablation, TIA 2, PFO, migraine phenomenon, restless legs, vitamin D deficiency, hyper lipidemia, here for evaluation of intermittent headaches and abnormal MRI brain (initially mild diffuse dural enhancement; follow up shows mixed age subdural hemorrhages).   His history of multiple mild/minor head traumas, which could be a factor in developing a CSF leak (? Dec 2015 - Jan 2016), especially now that he mentions clear drainage from the left nostil, which could represent traumatic CSF rhinorrhea. The low CSF pressure in combination with dual anti-platelet could have then triggered the multiple subdural hemorrhages (may have occurred b/w Mar and May 2016), as well as the dural enhancement. Other causes would include infection, inflammation and neoplastic causes, but I feel these are less likely.   Now s/p another concussion Dec 2016, with lingering HA, dizziness, neck pain (getting some treatments with Dr. Vela Prose).   Dx:  Cough headache  Subdural hematoma (HCC)  Dizziness and giddiness  Intracranial hypotension  Transient cerebral ischemia, unspecified type  RLS (restless legs syndrome)    PLAN: - continue integrative therapies for post-concussion  syndrome / neck pain - continue plavix for stroke prevention - discussed RLS (may be related to iron deficiency); continue clonazepam; may consider dopamine agonists in future vs conservative mgmt (stretching, yoga, hydration, pacing physicial activities)  Meds ordered this encounter  Medications  . clopidogrel (PLAVIX) 75 MG tablet    Sig: Take 1 tablet (75 mg total) by mouth daily.    Dispense:  90 tablet    Refill:  4   Return in about 1 year (around 11/20/2016).    Suanne Marker, MD 11/21/2015, 2:00 PM Certified in Neurology, Neurophysiology and Neuroimaging  [facility] [address] [place], [name] [address] [phone]

## 2016-02-05 ENCOUNTER — Ambulatory Visit
Admission: RE | Admit: 2016-02-05 | Discharge: 2016-02-05 | Disposition: A | Discharge status: Home / Self Care | Payer: 59 | Source: Ambulatory Visit | Attending: Family Medicine | Admitting: Family Medicine

## 2016-02-05 ENCOUNTER — Other Ambulatory Visit: Payer: Self-pay | Admitting: Family Medicine

## 2016-02-05 DIAGNOSIS — R109 Unspecified abdominal pain: Secondary | ICD-10-CM

## 2016-02-07 ENCOUNTER — Telehealth: Payer: Self-pay | Admitting: Diagnostic Neuroimaging

## 2016-02-07 ENCOUNTER — Other Ambulatory Visit: Payer: Self-pay | Admitting: Urology

## 2016-02-07 NOTE — Telephone Encounter (Signed)
Discussed with Dr Lucia GaskinsAhern who stated patient may stay off Plavix x 5 days for lithotripsy. Dr Lucia GaskinsAhern recommends he take Aspirin 81 mg while off Plavix if that is at all possible. Patient does need to understand that while off Plavix there is a small risk of TIA or stroke. Dr Lucia GaskinsAhern stated he should resume Plavix as soon as lithotripsy procedure is completed.  Called Pam, [facility] and advised her of all above. She stated that patient will not be able to take Asprin.  This RN restated that patient needs to understand his risk. Pam stated patient has said he has been off Plavix in past, and he understands  the risk. She requested the phone note be faxed to her. Done.

## 2016-02-07 NOTE — Telephone Encounter (Signed)
Pam with [facility] is calling in reference to patient having a kidney stone, patient plavix needing to see about holding for 5 days prior to lithotripsy procedure on 02/11/16.  Patient did not take plavix today or yesterday. Please call

## 2016-02-08 ENCOUNTER — Encounter (HOSPITAL_COMMUNITY): Payer: Self-pay

## 2016-02-08 ENCOUNTER — Encounter (HOSPITAL_COMMUNITY): Payer: Self-pay | Admitting: *Deleted

## 2016-02-08 ENCOUNTER — Ambulatory Visit (HOSPITAL_COMMUNITY)
Admission: RE | Admit: 2016-02-08 | Discharge: 2016-02-08 | Disposition: A | Discharge status: Home / Self Care | Payer: 59 | Source: Ambulatory Visit | Attending: Urology | Admitting: Urology

## 2016-02-08 DIAGNOSIS — Z01810 Encounter for preprocedural cardiovascular examination: Secondary | ICD-10-CM | POA: Diagnosis present

## 2016-02-08 DIAGNOSIS — N201 Calculus of ureter: Secondary | ICD-10-CM | POA: Diagnosis not present

## 2016-02-08 DIAGNOSIS — R9431 Abnormal electrocardiogram [ECG] [EKG]: Secondary | ICD-10-CM | POA: Diagnosis not present

## 2016-02-08 HISTORY — DX: Personal history of urinary calculi: Z87.442

## 2016-02-11 ENCOUNTER — Ambulatory Visit (HOSPITAL_COMMUNITY)
Admission: RE | Admit: 2016-02-11 | Discharge: 2016-02-11 | Disposition: A | Discharge status: Home / Self Care | Payer: 59 | Source: Ambulatory Visit | Attending: Urology | Admitting: Urology

## 2016-02-11 ENCOUNTER — Encounter (HOSPITAL_COMMUNITY)
Admission: RE | Disposition: A | Discharge status: Home / Self Care | Payer: Self-pay | Source: Ambulatory Visit | Attending: Urology

## 2016-02-11 ENCOUNTER — Ambulatory Visit (HOSPITAL_COMMUNITY): Payer: 59

## 2016-02-11 ENCOUNTER — Encounter (HOSPITAL_COMMUNITY): Payer: Self-pay | Admitting: *Deleted

## 2016-02-11 ENCOUNTER — Other Ambulatory Visit: Payer: Self-pay | Admitting: Urology

## 2016-02-11 DIAGNOSIS — N201 Calculus of ureter: Secondary | ICD-10-CM | POA: Diagnosis not present

## 2016-02-11 DIAGNOSIS — Z539 Procedure and treatment not carried out, unspecified reason: Secondary | ICD-10-CM | POA: Diagnosis present

## 2016-02-11 DIAGNOSIS — Z79899 Other long term (current) drug therapy: Secondary | ICD-10-CM | POA: Diagnosis not present

## 2016-02-11 DIAGNOSIS — Z8673 Personal history of transient ischemic attack (TIA), and cerebral infarction without residual deficits: Secondary | ICD-10-CM | POA: Insufficient documentation

## 2016-02-11 DIAGNOSIS — I4891 Unspecified atrial fibrillation: Secondary | ICD-10-CM | POA: Insufficient documentation

## 2016-02-11 DIAGNOSIS — Z7902 Long term (current) use of antithrombotics/antiplatelets: Secondary | ICD-10-CM | POA: Diagnosis not present

## 2016-02-11 DIAGNOSIS — Z7951 Long term (current) use of inhaled steroids: Secondary | ICD-10-CM | POA: Diagnosis not present

## 2016-02-11 DIAGNOSIS — N281 Cyst of kidney, acquired: Secondary | ICD-10-CM | POA: Diagnosis not present

## 2016-02-11 HISTORY — PX: EXTRACORPOREAL SHOCK WAVE LITHOTRIPSY: SHX1557

## 2016-02-11 SURGERY — LITHOTRIPSY, ESWL
Anesthesia: LOCAL | Laterality: Right

## 2016-02-11 MED ORDER — SODIUM CHLORIDE 0.9 % IV SOLN
INTRAVENOUS | Status: DC
  Administered 2016-02-11: 08:00:00 via INTRAVENOUS

## 2016-02-11 MED ORDER — CIPROFLOXACIN HCL 500 MG PO TABS
500.0000 mg | ORAL_TABLET | ORAL | Status: AC
  Administered 2016-02-11: 500 mg via ORAL
  Filled 2016-02-11: qty 1

## 2016-02-11 MED ORDER — DIAZEPAM 5 MG PO TABS
10.0000 mg | ORAL_TABLET | ORAL | Status: AC
  Administered 2016-02-11: 10 mg via ORAL
  Filled 2016-02-11: qty 2

## 2016-02-11 MED ORDER — SODIUM CHLORIDE 0.9 % IV SOLN
INTRAVENOUS | Status: DC
  Administered 2016-02-11: 14:00:00 via INTRAVENOUS

## 2016-02-11 MED ORDER — DIPHENHYDRAMINE HCL 25 MG PO CAPS
25.0000 mg | ORAL_CAPSULE | ORAL | Status: AC
  Administered 2016-02-11: 25 mg via ORAL
  Filled 2016-02-11: qty 1

## 2016-02-11 MED ORDER — CIPROFLOXACIN HCL 500 MG PO TABS: 500.0000 mg | ORAL_TABLET | ORAL | Status: DC

## 2016-02-11 NOTE — Progress Notes (Addendum)
Pt here today for his ESWL. After prepping in Short Stay ( IV pre meds) was taken [facility]. 45 minutes later [facility] [place] because on the cardiac monitor he is having Bigeminal PVCs. Pt returned to Short stay via w/c. VSS but heart rate is 45. Wife placed call to Dr Gilmore LarocheS Tilley's office ( cardiology ) and got voice mail. I then called Dr Kathie RhodesS Tilley's office and [facility] and patient could be seen by 1100 today . Pt and wife discharged to car for this appointment.

## 2016-02-11 NOTE — H&P (View-Only) (Signed)
CC: Acute Kidney Stone  HPI: Gregg Guerrero is a 66 year-old male patient who was referred by Dr. Sigmund HazelLisa Miller, MD who is here for further eval and management of kidney stones.  He was diagnosed with a kidney stone on 02/05/2016.   His pain started about 02/03/2016. The pain is on the right side.   Abdomen/Pelvic CT: 6.5 mm. The patient underwent CT scan prior to today's appointment.   The patient relates initially having flank pain and voiding symptoms. He is currently having flank pain and back pain. He denies having groin pain, nausea, vomiting, fever, chills, and voiding symptoms. He has been taking ultram. He has not caught a stone in his urine strainer since his symptoms began.   He has never had surgical treatment for calculi in the past. This is his first kidney stone.     ALLERGIES: None   MEDICATIONS: Plavix 75 mg tablet  Clonazepam 1 mg tablet  Iron  Vitamin D3 2,000 unit capsule     GU PSH: None     PSH Notes: foot surgery (2009, 2012), subdural Hematoma (2016)   NON-GU PSH: Tonsillectomy, 1965    GU PMH: None   NON-GU PMH: Arthritis Atrial Fibrillation Stroke/TIA    FAMILY HISTORY: 1 son - Son Congestive Heart Failure - Father stroke - Mother   SOCIAL HISTORY: Marital Status: Married Current Smoking Status: Patient has never smoked.   Tobacco Use Assessment Completed: Used Tobacco in last 30 days? Has never drank.  Does not drink caffeine. Patient's occupation [employer]/was [employer].    REVIEW OF SYSTEMS:    GU Review Male:   Patient reports frequent urination and get up at night to urinate. Patient denies hard to postpone urination, burning/ pain with urination, leakage of urine, stream starts and stops, trouble starting your stream, have to strain to urinate , erection problems, and penile pain.  Gastrointestinal (Upper):   Patient denies indigestion/ heartburn, vomiting, and nausea.  Gastrointestinal (Lower):   Patient denies diarrhea and  constipation.  Constitutional:   Patient reports fatigue. Patient denies fever, night sweats, and weight loss.  Skin:   Patient denies skin rash/ lesion and itching.  Eyes:   Patient denies blurred vision and double vision.  Ears/ Nose/ Throat:   Patient denies sore throat and sinus problems.  Hematologic/Lymphatic:   Patient denies swollen glands and easy bruising.  Cardiovascular:   Patient denies leg swelling and chest pains.  Respiratory:   Patient denies cough and shortness of breath.  Endocrine:   Patient denies excessive thirst.  Musculoskeletal:   Patient reports back pain and joint pain.   Neurological:   Patient reports headaches. Patient denies dizziness.  Psychologic:   Patient denies depression and anxiety.   VITAL SIGNS:      02/07/2016 01:35 PM  Weight 212 lb / 96.16 kg  Height 73 in / 185.42 cm  BP 130/81 mmHg  Pulse 91 /min  Temperature 99.8 F / 38 C  BMI 28.0 kg/m   MULTI-SYSTEM PHYSICAL EXAMINATION:    Constitutional: Well-nourished. No physical deformities. Normally developed. Good grooming.  Neck: Neck symmetrical, not swollen. Normal tracheal position.  Respiratory: No labored breathing, no use of accessory muscles. cta  Cardiovascular: Normal temperature, normal extremity pulses, no swelling, no varicosities. RRR  Lymphatic: No enlargement of neck, axillae, groin.  Skin: No paleness, no jaundice, no cyanosis. No lesion, no ulcer, no rash.  Neurologic / Psychiatric: Oriented to time, oriented to place, oriented to person. No depression, no anxiety,  no agitation.  Gastrointestinal: No mass, no tenderness, no rigidity, non obese abdomen.  Eyes: Normal conjunctivae. Normal eyelids.  Ears, Nose, Mouth, and Throat: Left ear no scars, no lesions, no masses. Right ear no scars, no lesions, no masses. Nose no scars, no lesions, no masses. Normal hearing. Normal lips.  Musculoskeletal: Normal gait and station of head and neck.     PAST DATA REVIEWED:  Source Of  History:  Patient  X-Ray Review: C.T. Abdomen/Pelvis: Reviewed Films. Discussed With Patient. The patient has a 6 mm right mid ureteral stone. This is very easily visualized on his scout film. There are no additional stones within either collecting system.    PROCEDURES:          Urinalysis w/Scope Dipstick Dipstick Cont'd Micro  Color: Yellow Bilirubin: Neg WBC/hpf: 6 - 10/hpf  Appearance: Cloudy Ketones: Neg RBC/hpf: 10 - 20/hpf  Specific Gravity: 1.020 Blood: 3+ Bacteria: NS (Not Seen)  pH: <=5.0 Protein: Neg Cystals: NS (Not Seen)  Glucose: Neg Urobilinogen: 0.2 Casts: Hyaline    Nitrites: Neg Trichomonas: Not Present    Leukocyte Esterase: Neg Mucous: Present      Epithelial Cells: 0 - 5/hpf      Yeast: NS (Not Seen)      Sperm: Not Present    ASSESSMENT:      ICD-10 Details  1 GU:   Calculus Ureter - N20.1 Right, The patient has a obstructing right midureteral stone that measures approximately 6 mm. This quite easily visualized on the CT scout film. We discussed treatment options today including medical expulsion therapy, shockwave lithotripsy, and ureteroscopy. The patient would like to proceed with shockwave lithotripsy. However, he is taking Plavix, his last dose was yesterday. We'll have to wait 5 days which would bring him till Monday morning for his procedure.  2   Renal Cysts, Simple - N28.1 The patient has bilateral cysts. These were first noted on a noncontrast CT. He is otherwise asymptomatic. We will plan to follow-up with a renal ultrasound at the time of his follow-up from his lithotripsy.   PLAN:            Medications New Meds: Tamsulosin Hcl 0.4 mg capsule, ext release 24 hr 1 capsule PO Daily   #14  2 Refill(s)    Refill Meds: Ultram 50 mg tablet 1-2 tablet PO Q 6 H   #20  0 Refill(s)            Orders Labs Urine Culture and Sensitivity          Schedule Return Visit/Planned Activity: ASAP - Schedule Surgery          Document Letter(s):  Created for  Patient: Clinical Summary    We discussed management options including medical expulsion therapy, shockwave lithotripsy, and ureteroscopy. Ultimately, the patient has opted for shock wave lithotripsy. I discussed with the patient the procedure in detail as well as the risk and benefits. The patient is aware that she may need additional procedures. She also is aware of the risks of hematoma and pain. We will try to get this patient's scheduled as soon as possible.         Notes:   The patient is on Plavix which he has stopped yesterday. He should be good for lithotripsy on Monday. At the time of his follow-up for his lithotripsy, we'll also obtain a renal ultrasound to evaluate the cyst seen on his kidney.

## 2016-02-11 NOTE — Discharge Instructions (Addendum)
Do not restart Plavix for 48 hours per Dr Annabell HowellsWrenn but he also wants you to contact Dr Marlou PorchHerrick to confirm this   Moderate Conscious Sedation, Adult, Care After These instructions provide you with information about caring for yourself after your procedure. Your health care provider may also give you more specific instructions. Your treatment has been planned according to current medical practices, but problems sometimes occur. Call your health care provider if you have any problems or questions after your procedure. What can I expect after the procedure? After your procedure, it is common:  To feel sleepy for several hours.  To feel clumsy and have poor balance for several hours.  To have poor judgment for several hours.  To vomit if you eat too soon. Follow these instructions at home: For at least 24 hours after the procedure:   Do not:  Participate in activities where you could fall or become injured.  Drive.  Use heavy machinery.  Drink alcohol.  Take sleeping pills or medicines that cause drowsiness.  Make important decisions or sign legal documents.  Take care of children on your own.  Rest. Eating and drinking  Follow the diet recommended by your health care provider.  If you vomit:  Drink water, juice, or soup when you can drink without vomiting.  Make sure you have little or no nausea before eating solid foods. General instructions  Have a responsible adult stay with you until you are awake and alert.  Take over-the-counter and prescription medicines only as told by your health care provider.  If you smoke, do not smoke without supervision.  Keep all follow-up visits as told by your health care provider. This is important. Contact a health care provider if:  You keep feeling nauseous or you keep vomiting.  You feel light-headed.  You develop a rash.  You have a fever. Get help right away if:  You have trouble breathing. This information is not  intended to replace advice given to you by your health care provider. Make sure you discuss any questions you have with your health care provider. Document Released: 11/03/2012 Document Revised: 06/18/2015 Document Reviewed: 05/05/2015 Elsevier Interactive Patient Education  2017 [employer] Lithotripsy, Care After Refer to this sheet in the next few weeks. These instructions provide you with information on caring for yourself after your procedure. Your health care provider may also give you more specific instructions. Your treatment has been planned according to current medical practices, but problems sometimes occur. Call your health care provider if you have any problems or questions after your procedure. WHAT TO EXPECT AFTER THE PROCEDURE   Your urine may have a red tinge for a few days after treatment. Blood loss is usually minimal.  You may have soreness in the back or flank area. This usually goes away after a few days. The procedure can cause blotches or bruises on the back where the pressure wave enters the skin. These marks usually cause only minimal discomfort and should disappear in a short time.  Stone fragments should begin to pass within 24 hours of treatment. However, a delayed passage is not unusual.  You may have pain, discomfort, and feel sick to your stomach (nauseated) when the crushed fragments of stone are passed down the tube from the kidney to the bladder. Stone fragments can pass soon after the procedure and may last for up to 4-8 weeks.  A small number of patients may have severe pain when stone fragments are not able to pass,  which leads to an obstruction.  If your stone is greater than 1 inch (2.5 cm) in diameter or if you have multiple stones that have a combined diameter greater than 1 inch (2.5 cm), you may require more than one treatment.  If you had a stent placed prior to your procedure, you may experience some discomfort, especially during urination. You may  experience the pain or discomfort in your flank or back, or you may experience a sharp pain or discomfort at the base of your penis or in your lower abdomen. The discomfort usually lasts only a few minutes after urinating. HOME CARE INSTRUCTIONS   Rest at home until you feel your energy improving.  Only take over-the-counter or prescription medicines for pain, discomfort, or fever as directed by your health care provider. Depending on the type of lithotripsy, you may need to take antibiotics and anti-inflammatory medicines for a few days.  Drink enough water and fluids to keep your urine clear or pale yellow. This helps "flush" your kidneys. It helps pass any remaining pieces of stone and prevents stones from coming back.  Most people can resume daily activities within 1-2 days after standard lithotripsy. It can take longer to recover from laser and percutaneous lithotripsy.  Strain all urine through the provided strainer. Keep all particulate matter and stones for your health care provider to see. The stone may be as small as a grain of salt. It is very important to use the strainer each and every time you pass your urine. Any stones that are found can be sent to a medical lab for examination.  Visit your health care provider for a follow-up appointment in a few weeks. Your doctor may remove your stent if you have one. Your health care provider will also check to see whether stone particles still remain. SEEK MEDICAL CARE IF:   Your pain is not relieved by medicine.  You have a lasting nauseous feeling.  You feel there is too much blood in the urine.  You develop persistent problems with frequent or painful urination that does not at least partially improve after 2 days following the procedure.  You have a congested cough.  You feel lightheaded.  You develop a rash or any other signs that might suggest an allergic problem.  You develop any reaction or side effects to your  medicine(s). SEEK IMMEDIATE MEDICAL CARE IF:   You experience severe back or flank pain or both.  You see nothing but blood when you urinate.  You cannot pass any urine at all.  You have a fever or shaking chills.  You develop shortness of breath, difficulty breathing, or chest pain.  You develop vomiting that will not stop after 6-8 hours.  You have a fainting episode. This information is not intended to replace advice given to you by your health care provider. Make sure you discuss any questions you have with your health care provider. Document Released: 02/02/2007 Document Revised: 10/04/2014 Document Reviewed: 07/29/2012 Elsevier Interactive Patient Education  2017 [employer].

## 2016-02-11 NOTE — Interval H&P Note (Signed)
History and Physical Interval Note:  He was cancelled from [facility] this AM for bigeminy but he got cleared by Dr. Donnie Ahoilley to be back on the schedule this afternoon.     02/11/2016 3:34 PM  Gregg Guerrero  has presented today for surgery, with the diagnosis of right ureteral stone  The various methods of treatment have been discussed with the patient and family. After consideration of risks, benefits and other options for treatment, the patient has consented to  Procedure(s): RIGHT EXTRACORPOREAL SHOCK WAVE LITHOTRIPSY (ESWL) (Right) as a surgical intervention .  The patient's history has been reviewed, patient examined, no change in status, stable for surgery.  I have reviewed the patient's chart and labs.  Questions were answered to the patient's satisfaction.     [name]

## 2016-02-11 NOTE — H&P (Signed)
CC: Acute Kidney Stone  HPI: Gregg Guerrero is a 66 year-old male patient who was referred by Dr. Sigmund HazelLisa Miller, MD who is here for further eval and management of kidney stones.  He was diagnosed with a kidney stone on 02/05/2016.   His pain started about 02/03/2016. The pain is on the right side.   Abdomen/Pelvic CT: 6.5 mm. The patient underwent CT scan prior to today's appointment.   The patient relates initially having flank pain and voiding symptoms. He is currently having flank pain and back pain. He denies having groin pain, nausea, vomiting, fever, chills, and voiding symptoms. He has been taking ultram. He has not caught a stone in his urine strainer since his symptoms began.   He has never had surgical treatment for calculi in the past. This is his first kidney stone.     ALLERGIES: None   MEDICATIONS: Plavix 75 mg tablet  Clonazepam 1 mg tablet  Iron  Vitamin D3 2,000 unit capsule     GU PSH: None     PSH Notes: foot surgery (2009, 2012), subdural Hematoma (2016)   NON-GU PSH: Tonsillectomy, 1965    GU PMH: None   NON-GU PMH: Arthritis Atrial Fibrillation Stroke/TIA    FAMILY HISTORY: 1 son - Son Congestive Heart Failure - Father stroke - Mother   SOCIAL HISTORY: Marital Status: Married Current Smoking Status: Patient has never smoked.   Tobacco Use Assessment Completed: Used Tobacco in last 30 days? Has never drank.  Does not drink caffeine. Patient's occupation [employer]/was [employer].    REVIEW OF SYSTEMS:    GU Review Male:   Patient reports frequent urination and get up at night to urinate. Patient denies hard to postpone urination, burning/ pain with urination, leakage of urine, stream starts and stops, trouble starting your stream, have to strain to urinate , erection problems, and penile pain.  Gastrointestinal (Upper):   Patient denies indigestion/ heartburn, vomiting, and nausea.  Gastrointestinal (Lower):   Patient denies diarrhea and  constipation.  Constitutional:   Patient reports fatigue. Patient denies fever, night sweats, and weight loss.  Skin:   Patient denies skin rash/ lesion and itching.  Eyes:   Patient denies blurred vision and double vision.  Ears/ Nose/ Throat:   Patient denies sore throat and sinus problems.  Hematologic/Lymphatic:   Patient denies swollen glands and easy bruising.  Cardiovascular:   Patient denies leg swelling and chest pains.  Respiratory:   Patient denies cough and shortness of breath.  Endocrine:   Patient denies excessive thirst.  Musculoskeletal:   Patient reports back pain and joint pain.   Neurological:   Patient reports headaches. Patient denies dizziness.  Psychologic:   Patient denies depression and anxiety.   VITAL SIGNS:      02/07/2016 01:35 PM  Weight 212 lb / 96.16 kg  Height 73 in / 185.42 cm  BP 130/81 mmHg  Pulse 91 /min  Temperature 99.8 F / 38 C  BMI 28.0 kg/m   MULTI-SYSTEM PHYSICAL EXAMINATION:    Constitutional: Well-nourished. No physical deformities. Normally developed. Good grooming.  Neck: Neck symmetrical, not swollen. Normal tracheal position.  Respiratory: No labored breathing, no use of accessory muscles. cta  Cardiovascular: Normal temperature, normal extremity pulses, no swelling, no varicosities. RRR  Lymphatic: No enlargement of neck, axillae, groin.  Skin: No paleness, no jaundice, no cyanosis. No lesion, no ulcer, no rash.  Neurologic / Psychiatric: Oriented to time, oriented to place, oriented to person. No depression, no anxiety,  no agitation.  Gastrointestinal: No mass, no tenderness, no rigidity, non obese abdomen.  Eyes: Normal conjunctivae. Normal eyelids.  Ears, Nose, Mouth, and Throat: Left ear no scars, no lesions, no masses. Right ear no scars, no lesions, no masses. Nose no scars, no lesions, no masses. Normal hearing. Normal lips.  Musculoskeletal: Normal gait and station of head and neck.     PAST DATA REVIEWED:  Source Of  History:  Patient  X-Ray Review: C.T. Abdomen/Pelvis: Reviewed Films. Discussed With Patient. The patient has a 6 mm right mid ureteral stone. This is very easily visualized on his scout film. There are no additional stones within either collecting system.    PROCEDURES:          Urinalysis w/Scope Dipstick Dipstick Cont'd Micro  Color: Yellow Bilirubin: Neg WBC/hpf: 6 - 10/hpf  Appearance: Cloudy Ketones: Neg RBC/hpf: 10 - 20/hpf  Specific Gravity: 1.020 Blood: 3+ Bacteria: NS (Not Seen)  pH: <=5.0 Protein: Neg Cystals: NS (Not Seen)  Glucose: Neg Urobilinogen: 0.2 Casts: Hyaline    Nitrites: Neg Trichomonas: Not Present    Leukocyte Esterase: Neg Mucous: Present      Epithelial Cells: 0 - 5/hpf      Yeast: NS (Not Seen)      Sperm: Not Present    ASSESSMENT:      ICD-10 Details  1 GU:   Calculus Ureter - N20.1 Right, The patient has a obstructing right midureteral stone that measures approximately 6 mm. This quite easily visualized on the CT scout film. We discussed treatment options today including medical expulsion therapy, shockwave lithotripsy, and ureteroscopy. The patient would like to proceed with shockwave lithotripsy. However, he is taking Plavix, his last dose was yesterday. We'll have to wait 5 days which would bring him till Monday morning for his procedure.  2   Renal Cysts, Simple - N28.1 The patient has bilateral cysts. These were first noted on a noncontrast CT. He is otherwise asymptomatic. We will plan to follow-up with a renal ultrasound at the time of his follow-up from his lithotripsy.   PLAN:            Medications New Meds: Tamsulosin Hcl 0.4 mg capsule, ext release 24 hr 1 capsule PO Daily   #14  2 Refill(s)    Refill Meds: Ultram 50 mg tablet 1-2 tablet PO Q 6 H   #20  0 Refill(s)            Orders Labs Urine Culture and Sensitivity          Schedule Return Visit/Planned Activity: ASAP - Schedule Surgery          Document Letter(s):  Created for  Patient: Clinical Summary    We discussed management options including medical expulsion therapy, shockwave lithotripsy, and ureteroscopy. Ultimately, the patient has opted for shock wave lithotripsy. I discussed with the patient the procedure in detail as well as the risk and benefits. The patient is aware that she may need additional procedures. She also is aware of the risks of hematoma and pain. We will try to get this patient's scheduled as soon as possible.         Notes:   The patient is on Plavix which he has stopped yesterday. He should be good for lithotripsy on Monday. At the time of his follow-up for his lithotripsy, we'll also obtain a renal ultrasound to evaluate the cyst seen on his kidney.

## 2016-04-22 ENCOUNTER — Encounter (HOSPITAL_COMMUNITY): Payer: Self-pay | Admitting: Urology

## 2016-05-30 ENCOUNTER — Other Ambulatory Visit: Payer: Self-pay | Admitting: Gastroenterology

## 2016-05-30 DIAGNOSIS — R1084 Generalized abdominal pain: Secondary | ICD-10-CM

## 2016-06-03 ENCOUNTER — Ambulatory Visit
Admission: RE | Admit: 2016-06-03 | Discharge: 2016-06-03 | Disposition: A | Discharge status: Home / Self Care | Payer: 59 | Source: Ambulatory Visit | Attending: Gastroenterology | Admitting: Gastroenterology

## 2016-06-03 DIAGNOSIS — R1084 Generalized abdominal pain: Secondary | ICD-10-CM

## 2016-06-03 MED ORDER — IOPAMIDOL (ISOVUE-300) INJECTION 61%
100.0000 mL | Freq: Once | INTRAVENOUS | Status: AC | PRN
  Administered 2016-06-03: 100 mL via INTRAVENOUS

## 2016-08-14 DIAGNOSIS — H40013 Open angle with borderline findings, low risk, bilateral: Secondary | ICD-10-CM | POA: Insufficient documentation

## 2016-11-18 ENCOUNTER — Other Ambulatory Visit: Payer: Self-pay | Admitting: Diagnostic Neuroimaging

## 2016-11-24 ENCOUNTER — Ambulatory Visit: Payer: Commercial Managed Care - HMO | Admitting: Diagnostic Neuroimaging

## 2016-12-09 ENCOUNTER — Encounter: Payer: Self-pay | Admitting: Diagnostic Neuroimaging

## 2016-12-09 ENCOUNTER — Ambulatory Visit: Payer: Commercial Managed Care - HMO | Admitting: Diagnostic Neuroimaging

## 2016-12-09 VITALS — BP 131/78 | HR 78 | Ht 73.0 in | Wt 194.6 lb

## 2016-12-09 DIAGNOSIS — G4483 Primary cough headache: Secondary | ICD-10-CM

## 2016-12-09 DIAGNOSIS — G2581 Restless legs syndrome: Secondary | ICD-10-CM | POA: Diagnosis not present

## 2016-12-09 DIAGNOSIS — G459 Transient cerebral ischemic attack, unspecified: Secondary | ICD-10-CM

## 2016-12-09 NOTE — Progress Notes (Signed)
[facility]  PATIENT: Gregg Guerrero DOB: 07/12/1950  REFERRING CLINICIAN: Hyacinth MeekerMiller HISTORY FROM: patient REASON FOR VISIT: follow up   HISTORICAL  CHIEF COMPLAINT:  Chief Complaint  Patient presents with  . Follow-up  . post concussion/ cough headache    doing well no headaches.   Marland Kitchen. Hx TIA    HISTORY OF PRESENT ILLNESS:   UPDATE (12/09/16, VRP): Since last visit, doing well. Tolerating meds. No alleviating or aggravating factors. HA are essentially resolved.   UPDATE 11/21/15: Since last visit, symptoms are improving. Concussion effect has improved. Cough HA are stable. Integrative therapy treatments are going well.   UPDATE 05/22/15: Since last visit, had another concussion (work related) at [employer] running up stairs and hit over-head concrete beam. No LOC, but had significant stun/concussion symptoms. Then went to see Dr. Jordan LikesPool (CT head was negative). Then went to Dr. Vela ProseLewitt (head specialist) for workman's comp HA treatment for the Dec 2016 event. Now returns to re-establish for our prior evaluation and follow up of recent MRI brain scan 6 weeks ago. Continues with HA, dizziness, exertion HA, and neck pain issues. No more significant nasal drainage, but occ notes in the early morning.   UPDATE 06/20/14: Since last visit, wife thinks general mood and energy level is down. MRI brain on Friday showed subdural hematomas (mixed ages). Also notes clear nasal drainage (left nostril) since Feb 2016, usually in the early AM. Headaches have continued. No headaches today, but they have been fluctuating. Also developed hearing loss 3 weeks ago with ringing in ears.  PRIOR HPI (04/18/14): 66 year old right-handed male here for evaluation of headaches, abnormal MRI, with history of TIAs. December 2015 patient was in the attic, removing Christmas decorations, when he struck his head on overlying rafters several times. He think he hit his head 3 times. He did not get  knocked unconscious. He had significant pain on the top of his head with "knot swelling" over the site of impact. Around January 20 patient had another episode of hitting his head, while getting into the passenger side of the truck. He did not note any significant pain initially. However within 15 minutes he had severe neck pain. One to 2 weeks later he developed onset of intermittent headaches, especially triggered by sneezing, coughing, bending or straining. Headaches have been intermittent, with worse headaches around March 28, 2014. Over the last 2 weeks headaches have spontaneously improved. Patient has history of migraine phenomenon without headaches, specifically seeing static, spots, scintillating scotoma, associated with photophobia, phonophobia, malaise. Usually he has to lay down and symptoms resolve. He never has a significant headache with these episodes. These occur one to 2 times per month. Recently patient went to PCP for evaluation. MRI of the brain and MRA of the head were performed, which show diffuse dural enhancement, raising possibility of intracranial hypotension. Otherwise scans were unremarkable. Patient previously had TIA diagnoses in 1998 in 2000. In 1998 patient had onset of right facial droop lasting for 10-20 minutes and then resolved. In 2000, patient had second episode where he was mute for and to 20 minutes without other focal numbness or weakness. Interestingly he was able to write normally with a pen and communicate even though he was not able to communicate verbally. Patient has had PFO diagnosed previously. No DVT was found at that time. Patient then diagnosed with atrial fibrillation, status post ablation. Patient currently managed with risk factor reduction and Plavix.   REVIEW OF SYSTEMS: Full 14 system review  of systems performed and negative except: restless legs snoring.    ALLERGIES: No Known Allergies  HOME MEDICATIONS: Outpatient Medications Prior to Visit    Medication Sig Dispense Refill  . cetirizine (ZYRTEC) 10 MG tablet Take 10 mg by mouth daily.    . Cholecalciferol (VITAMIN D) 2000 UNITS CAPS Take 14,000 Units by mouth once a week.     . clonazePAM (KLONOPIN) 1 MG tablet Take 1 mg by mouth at bedtime.    . clopidogrel (PLAVIX) 75 MG tablet Take 75 mg by mouth daily.    Marland Kitchen. doxylamine, Sleep, (UNISOM) 25 MG tablet Take 25 mg by mouth at bedtime as needed for sleep.    . Ferrous Sulfate (IRON) 325 (65 FE) MG TABS Take 325 mg by mouth daily.     . fluticasone (FLONASE) 50 MCG/ACT nasal spray Place daily as needed into both nostrils.     Marland Kitchen. MAGNESIUM PO Take by mouth. 150mg  po daily    . Multiple Vitamin (MULTIVITAMIN) tablet Take 1 tablet by mouth daily.    . Vit B6-Vit B12-Omega 3 Acids (VITAMIN B PLUS+ PO) Take by mouth. Unsure of which vit B    . clopidogrel (PLAVIX) 75 MG tablet TAKE 1 TABLET BY MOUTH  DAILY 90 tablet 0  . ibuprofen (ADVIL,MOTRIN) 200 MG tablet Take 200 mg by mouth every 6 (six) hours as needed.    . tamsulosin (FLOMAX) 0.4 MG CAPS capsule Take 0.4 mg by mouth daily.    . traMADol (ULTRAM) 50 MG tablet Take 50 mg by mouth every 6 (six) hours as needed.     No facility-administered medications prior to visit.     PAST MEDICAL HISTORY: Past Medical History:  Diagnosis Date  . Anxiety   . Arthritis   . Concussion 12/2014  . Dysrhythmia    hx afib, with ablation to correct  . GERD (gastroesophageal reflux disease)   . Headache   . History of kidney stones   . History of TIA (transient ischemic attack)    hx of 2  . Migraine    hx of  . PFO (patent foramen ovale)   . RLS (restless legs syndrome)   . Sleep apnea    mild-no cpap needed  . TIA (transient ischemic attack)     PAST SURGICAL HISTORY: Past Surgical History:  Procedure Laterality Date  . CARDIAC CATHETERIZATION     2000  . CARDIAC ELECTROPHYSIOLOGY STUDY AND ABLATION  2009   [facility] at [facility]  . COLONOSCOPY    . FOOT ARTHROTOMY  2008   right foot    . SHOULDER SURGERY Left 2012   [facility] (Dr. Rennis ChrisSupple)  . TONSILLECTOMY      FAMILY HISTORY: Family History  Problem Relation Age of Onset  . Congestive Heart Failure Father   . Stroke Mother     SOCIAL HISTORY:  Social History   Socioeconomic History  . Marital status: Married    Spouse name: Eunice BlaseDebbie  . Number of children: 1  . Years of education: College   . Highest education level: Not on file  Social Needs  . Financial resource strain: Not on file  . Food insecurity - worry: Not on file  . Food insecurity - inability: Not on file  . Transportation needs - medical: Not on file  . Transportation needs - non-medical: Not on file  Occupational History    Employer: [employer]  Tobacco Use  . Smoking status: Never Smoker  . Smokeless tobacco: Never  Used  Substance and Sexual Activity  . Alcohol use: Yes    Comment: rare  . Drug use: No  . Sexual activity: Not on file  Other Topics Concern  . Not on file  Social History Narrative   Patient lives at home with wife.    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    Patient has a college education.    Patient has 1 child.    Patient quit caffeine use 2005.   Patient is right handed.      PHYSICAL EXAM  Vitals:   12/09/16 1331  BP: 131/78  Pulse: 78  Weight: 194 lb 9.6 oz (88.3 kg)  Height: 6\' 1"  (1.854 m)    Body mass index is 25.67 kg/m.  No exam data present  No flowsheet data found.  GENERAL EXAM: Patient is in no distress; well developed, nourished and groomed; neck is supple  CARDIOVASCULAR: Regular rate and rhythm, no murmurs, no carotid bruits  NEUROLOGIC: MENTAL STATUS: awake, alert, language fluent, comprehension intact, naming intact, fund of knowledge appropriate CRANIAL NERVE: no papilledema on fundoscopic exam, pupils equal and reactive to light, visual fields full to confrontation, extraocular muscles intact, no nystagmus, facial sensation and strength  symmetric, hearing intact, palate elevates symmetrically, uvula midline, shoulder shrug symmetric, tongue midline. MOTOR: normal bulk and tone, full strength in the BUE, BLE SENSORY: normal and symmetric to light touch, temperature, vibration COORDINATION: finger-nose-finger, fine finger movements normal REFLEXES: deep tendon reflexes present and symmetric GAIT/STATION: narrow based gait; romberg is negative    DIAGNOSTIC DATA (LABS, IMAGING, TESTING) - I reviewed patient records, labs, notes, testing and imaging myself where available.  Lab Results  Component Value Date   WBC 6.2 07/11/2014   HGB 16.4 07/11/2014   HCT 48.4 07/11/2014   MCV 86.7 07/11/2014   PLT 207 07/11/2014      Component Value Date/Time   NA 139 07/11/2014 1507   K 4.6 07/11/2014 1507   CL 106 07/11/2014 1507   CO2 26 07/11/2014 1507   GLUCOSE 109 (H) 07/11/2014 1507   BUN 19 07/11/2014 1507   CREATININE 1.12 07/11/2014 1507   CREATININE 1.04 04/05/2014 1622   CALCIUM 9.0 07/11/2014 1507   PROT 6.2 12/04/2006 1521   ALBUMIN 3.7 12/04/2006 1521   AST 21 12/04/2006 1521   ALT 25 12/04/2006 1521   ALKPHOS 61 12/04/2006 1521   BILITOT 0.8 12/04/2006 1521   GFRNONAA >60 07/11/2014 1507   GFRAA >60 07/11/2014 1507   No results found for: CHOL, HDL, LDLCALC, LDLDIRECT, TRIG, CHOLHDL No results found for: WJXB1Y No results found for: VITAMINB12 Lab Results  Component Value Date   TSH 0.908 *Test methodology is 3rd generation TSH* 12/04/2006    04/07/14 MRI brain [I reviewed images myself and agree with interpretation. -VRP]  1. Mild, diffuse dural enhancement. No other signs of intracranial hypotension are clearly identified, however if this is a clinical consideration correlation with lumbar puncture and opening pressure may be informative. 2. Chronic left cerebellar infarct, unchanged.  04/07/14 MRA head [I reviewed images myself and agree with interpretation. -VRP]  - No major intracranial arterial  occlusion. Mild bilateral supraclinoid ICA narrowing.  06/16/14 MRI brain [I reviewed images myself and agree with interpretation. -VRP]  1. New, small bilateral subdural hematomas, right larger than left. No midline shift. 2. Increased, moderate diffuse dural thickening and enhancement. Considerations include intracranial hypotension, reactive dural thickening due to subdural hemorrhage, infection, and neoplasm.   01/09/15  CT head [I reviewed images myself and agree with interpretation. -VRP]  1. Resolved small right subdural hematoma since July. 2. No new intracranial abnormality, negative noncontrast CT appearance of the brain.  March 2017 MRI brain [I reviewed images myself and agree with interpretation. -VRP]  - significant improvement in subdural hematoma - significant improvement in dural enhancement - chronic left cerebellar infarct (stable)     ASSESSMENT AND PLAN  66 y.o. year old male here with history of atrial fibrillation status post ablation, TIA 2, PFO, migraine phenomenon, restless legs, vitamin D deficiency, hyper lipidemia, here for evaluation of intermittent headaches and abnormal MRI brain (initially mild diffuse dural enhancement; follow up shows mixed age subdural hemorrhages).   His history of multiple mild/minor head traumas, which could be a factor in developing a CSF leak (? Dec 2015 - Jan 2016), especially now that he mentions clear drainage from the left nostil, which could represent traumatic CSF rhinorrhea. The low CSF pressure in combination with dual anti-platelet could have then triggered the multiple subdural hemorrhages (may have occurred b/w Mar and May 2016), as well as the dural enhancement. Other causes would include infection, inflammation and neoplastic causes, but I feel these are less likely.   Now s/p another concussion Dec 2016, with lingering HA, dizziness, neck pain (was getting some treatments with Dr. Vela Prose).   Dx:  Cough headache  RLS  (restless legs syndrome)  Transient cerebral ischemia, unspecified type    PLAN:  TIA  - continue plavix for stroke prevention  RLS - may be related to iron deficiency; continue clonazepam; may consider dopamine agonists in future vs conservative mgmt (stretching, yoga, hydration, pacing physicial activities)  POST CONCUSSION SYNDROME - resolved  Return if symptoms worsen or fail to improve, for return to PCP.    Suanne Marker, MD 12/09/2016, 1:54 PM Certified in Neurology, Neurophysiology and Neuroimaging  [facility] [address], [address] [place], [name] [address] [phone]

## 2017-03-11 ENCOUNTER — Other Ambulatory Visit: Payer: Self-pay | Admitting: Diagnostic Neuroimaging

## 2017-03-19 ENCOUNTER — Telehealth: Payer: Self-pay | Admitting: Diagnostic Neuroimaging

## 2017-03-19 NOTE — Telephone Encounter (Signed)
[facility]gi who stated patient last filled Plavix at their pharmacy in 2017. This RN advised her she may refill, Plavix 75 mg, take 75 mg total by mouth once daily,  disp #90 tabs for 3 months, 0 refills. Advised her that future refills must come from PCP. Patient has been released from neuro services to PCP since Nov 2018. Ugi repeated Rx correctly, verbalized understanding.

## 2017-03-19 NOTE — Telephone Encounter (Signed)
Pt is needing a PA for clopidogrel (PLAVIX) 75 MG tablet sent to [facility]. Pt has been going through [facility] but [facility] for a 90 day supply. Pt would like a call back to know the status

## 2017-06-11 ENCOUNTER — Other Ambulatory Visit: Payer: Self-pay | Admitting: Diagnostic Neuroimaging

## 2017-09-10 IMAGING — CT CT ABD-PELV W/O CM
2 of 4 series · 15 of 46 positions shown, 17 images · non-contrast
Comparison: None.

CLINICAL DATA: Right flank pain, hematuria

EXAM:
CT ABDOMEN AND PELVIS WITHOUT CONTRAST
TECHNIQUE: Multidetector CT imaging of the abdomen and pelvis was performed
following the standard protocol without IV contrast.

[Series 2: renal standard/full · axial · 0.74mm/px · z∈[+624,+1119]mm · 12 of 113 slices shown, 14 images]
[im 9/113  soft-tissue]
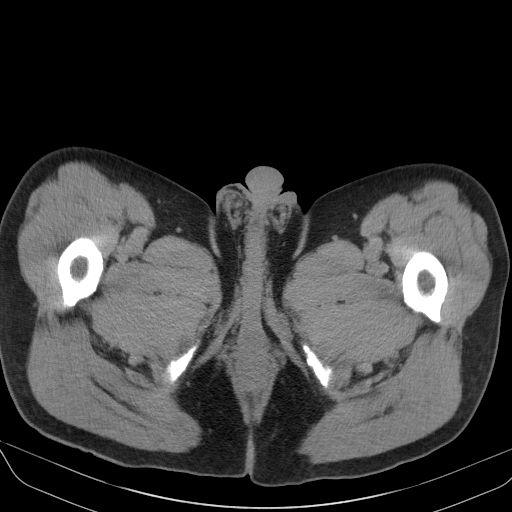
[im 9/113  bone]
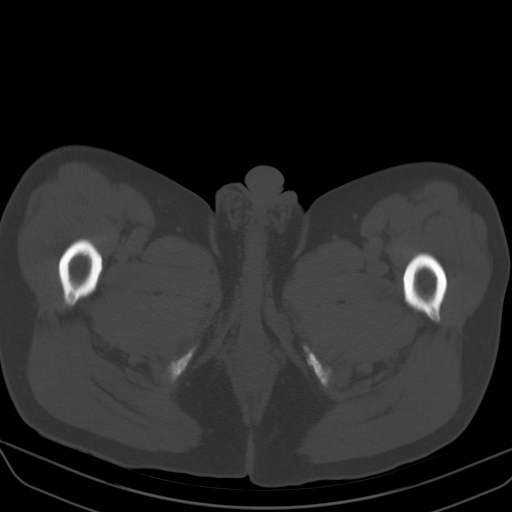
[im 18/113  soft-tissue]
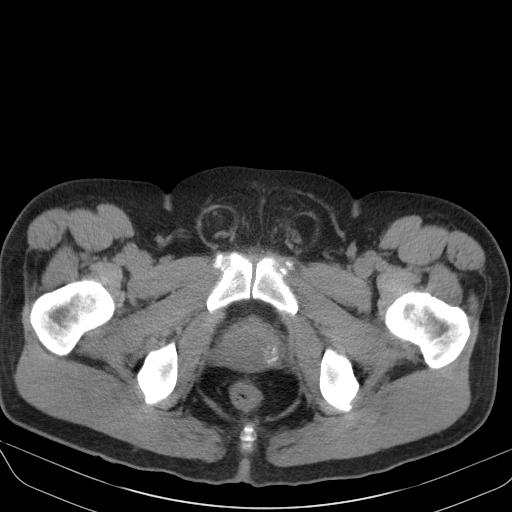
[im 27/113  soft-tissue]
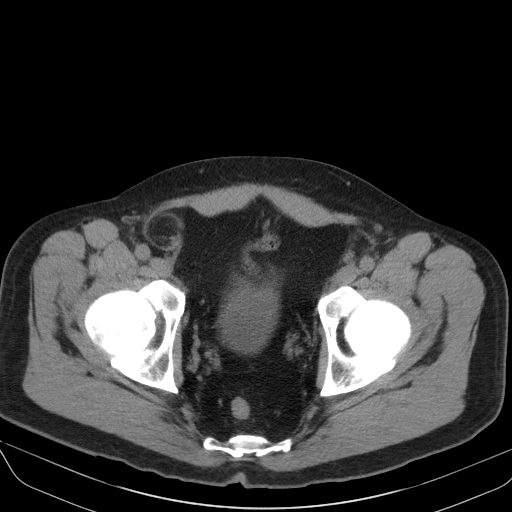
[im 36/113  soft-tissue]
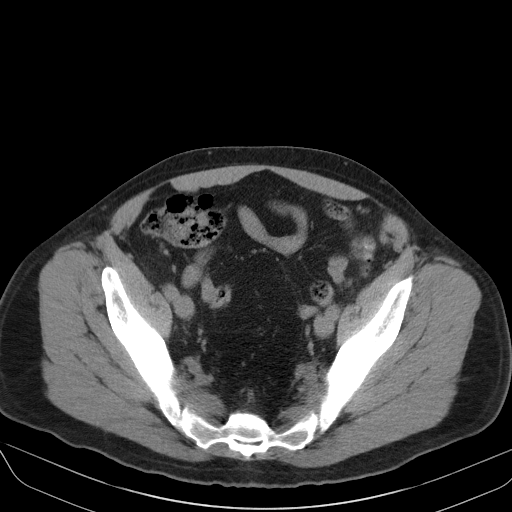
[im 45/113  soft-tissue]
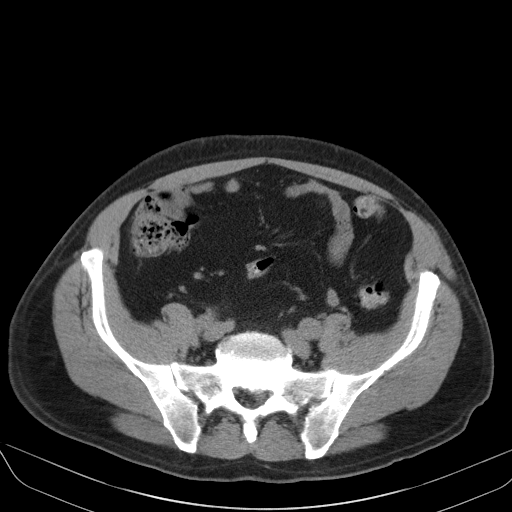
[im 54/113  soft-tissue]
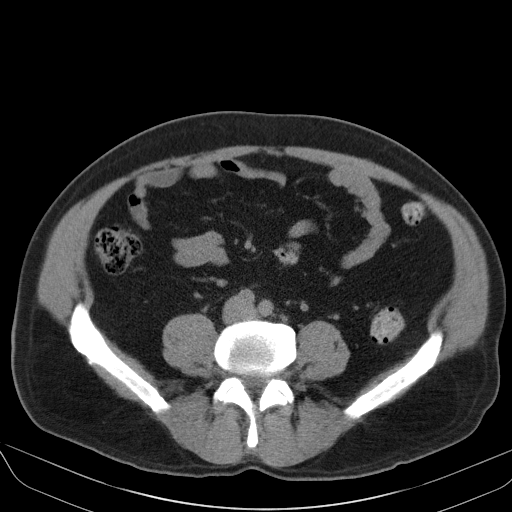
[im 63/113  soft-tissue]
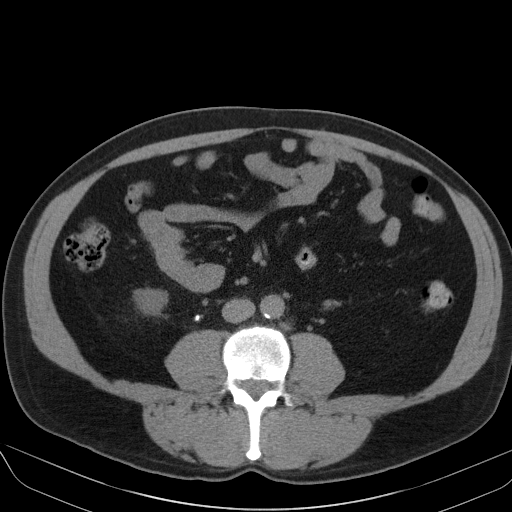
[im 72/113  soft-tissue]
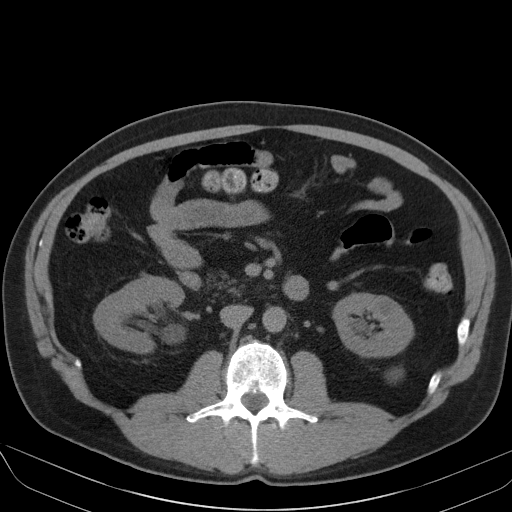
[im 81/113  soft-tissue]
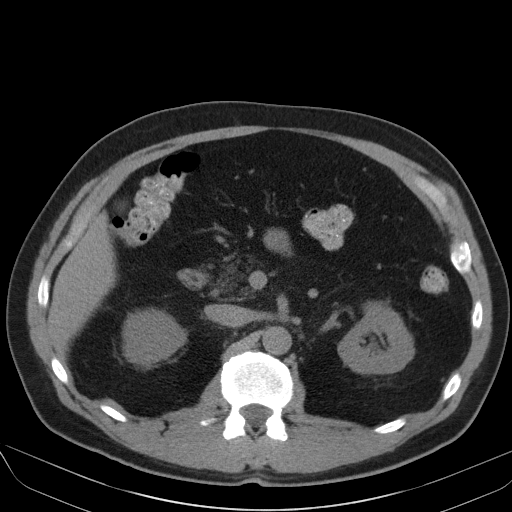
[im 81/113  bone]
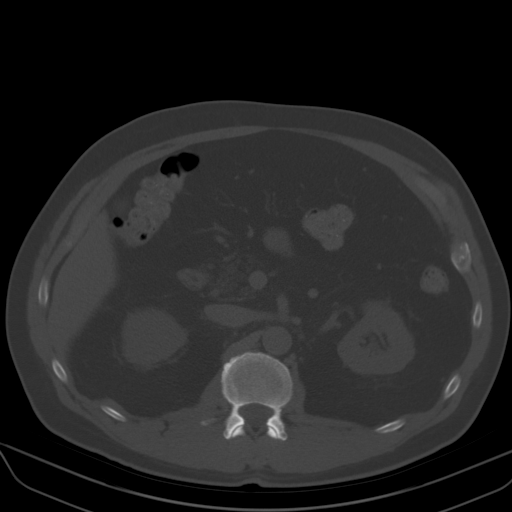
[im 90/113  soft-tissue]
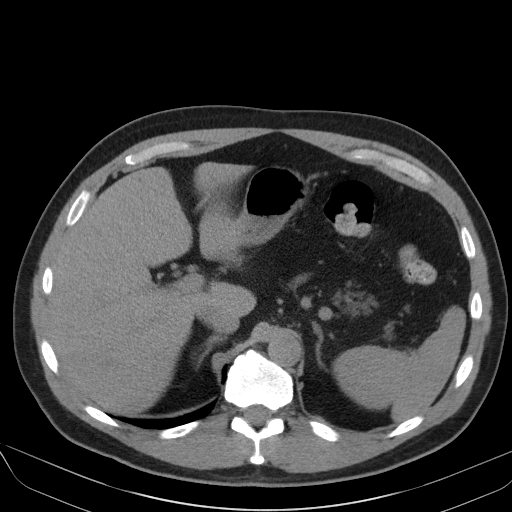
[im 99/113  soft-tissue]
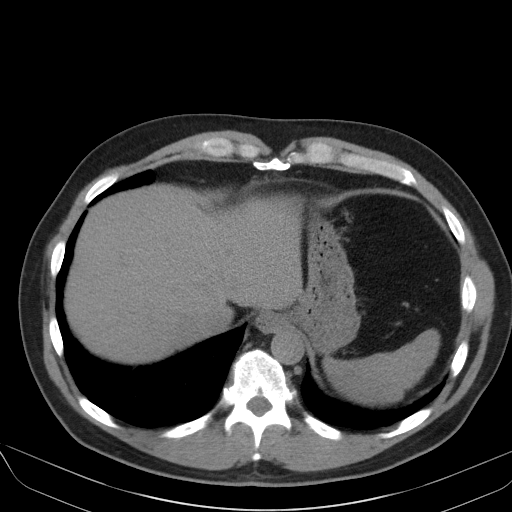
[im 108/113  soft-tissue]
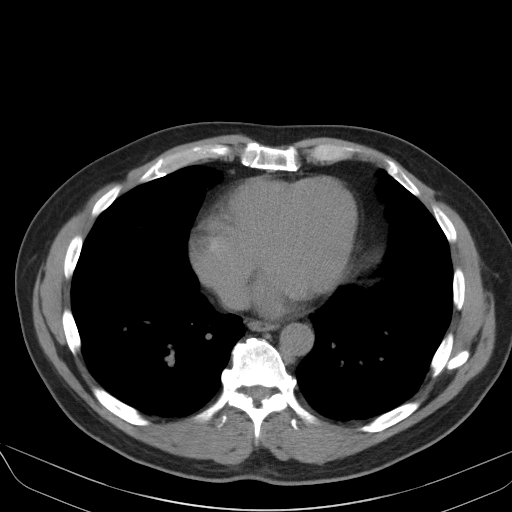

[Series 3: cor · coronal · 0.75mm/px · 3 of 93 slices shown]
[im 31/93  soft-tissue]
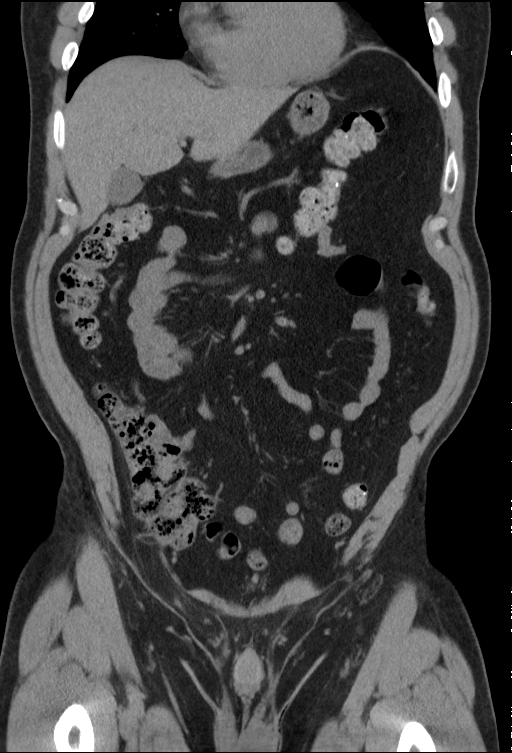
[im 41/93  soft-tissue]
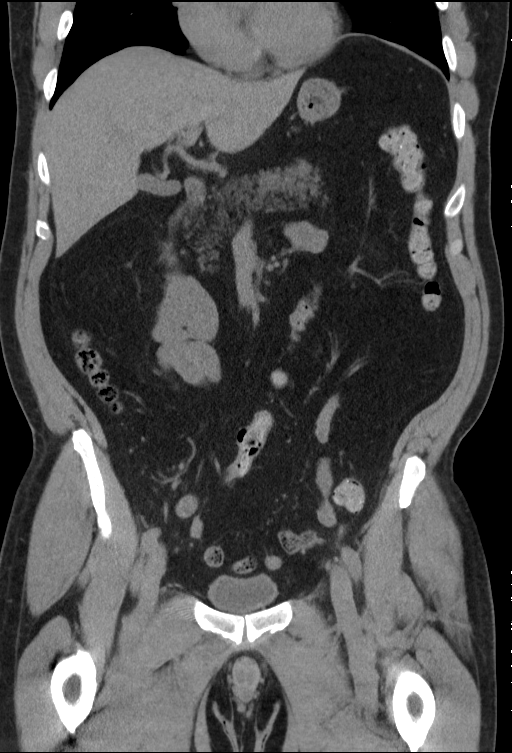
[im 52/93  soft-tissue]
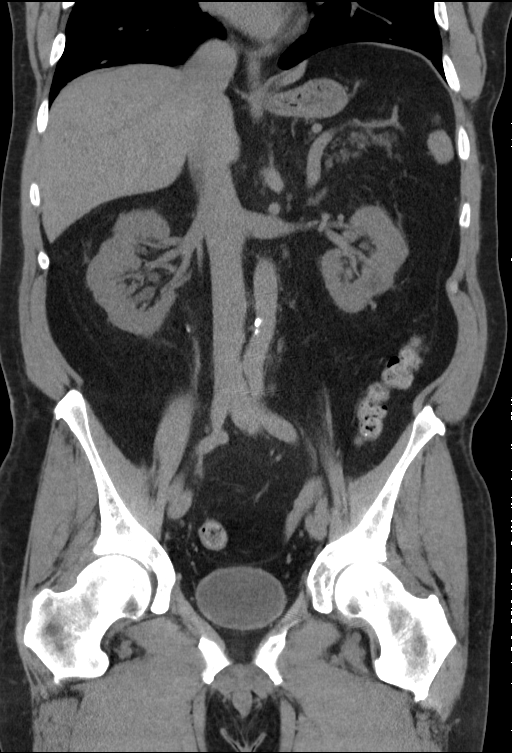

[15 of 46 positions shown; findings below may reference images not displayed]

FINDINGS: Lower chest: The lung bases are normal.

Hepatobiliary: Unenhanced liver shows no biliary ductal dilatation.
No calcified gallstones are noted within gallbladder.

Pancreas: Unenhanced pancreas is atrophic partially fatty replaced.

Spleen: Unenhanced spleen is normal.

Adrenals/Urinary Tract: No adrenal gland mass. There is mild right
hydronephrosis and right proximal hydroureter. Axial image 50 there
is 6.5 mm calcified obstructive calculus in proximal right ureter at
the level of L3 vertebral body. Bilateral distal ureter is
unremarkable. No calcified left ureteral calculi. No left
hydronephrosis. There is exophytic cyst in midpole posterior aspect
of the left kidney measures 2.5 cm. Probable cyst in upper pole
anterior aspect of the left kidney measures 1.2 cm. Probable cyst in
upper pole of the right kidney measures 1.1 cm.

Stomach/Bowel: No small bowel obstruction. No thickened or dilated
small bowel loops. Moderate stool noted within cecum. Some colonic
stool noted within right colon. No pericecal inflammation. The
terminal ileum is unremarkable. The appendix is not identified. Some
colonic stool noted in descending colon proximal sigmoid colon. No
evidence of acute colitis or diverticulitis.

Vascular/Lymphatic: No aortic aneurysm. No adenopathy. Mild
atherosclerotic calcifications of distal abdominal aorta.

Reproductive: Prostate gland calcifications are noted. Bilateral
distal ureter is unremarkable. No pelvic mass.

Other: No ascites or free abdominal air. Bilateral inguinal scrotal
canal small hernia containing fat without evidence of acute
complication.

Musculoskeletal: No destructive bony lesions are noted. Mild
degenerative changes thoracolumbar spine.
IMPRESSION: 1. There is mild right hydronephrosis and proximal right
hydroureter.
2. There is 6.5 mm calcified obstructive calculus in proximal right
ureter at the level of L3 vertebral body.
3. Probable bilateral renal cysts.
4. No calcified calculi are noted within urinary bladder.
5. No pericecal inflammation. Moderate stool noted within cecum.
Terminal ileum is unremarkable.
6. Mild atrophic partially fatty replaced pancreas.
7. No small bowel or colonic obstruction.
8. Mild degenerative changes thoracolumbar spine.

## 2017-10-16 DIAGNOSIS — H401131 Primary open-angle glaucoma, bilateral, mild stage: Secondary | ICD-10-CM | POA: Insufficient documentation

## 2018-03-04 ENCOUNTER — Ambulatory Visit: Payer: Self-pay | Admitting: Cardiology

## 2018-03-05 ENCOUNTER — Ambulatory Visit: Payer: Medicare Other | Admitting: Cardiology

## 2018-03-05 ENCOUNTER — Encounter: Payer: Self-pay | Admitting: Cardiology

## 2018-03-05 VITALS — BP 136/66 | HR 73 | Ht 73.0 in | Wt 203.0 lb

## 2018-03-05 DIAGNOSIS — Z9889 Other specified postprocedural states: Secondary | ICD-10-CM | POA: Diagnosis not present

## 2018-03-05 DIAGNOSIS — Q211 Atrial septal defect: Secondary | ICD-10-CM | POA: Insufficient documentation

## 2018-03-05 DIAGNOSIS — I480 Paroxysmal atrial fibrillation: Secondary | ICD-10-CM

## 2018-03-05 DIAGNOSIS — Z8679 Personal history of other diseases of the circulatory system: Secondary | ICD-10-CM | POA: Diagnosis not present

## 2018-03-05 DIAGNOSIS — G459 Transient cerebral ischemic attack, unspecified: Secondary | ICD-10-CM

## 2018-03-05 DIAGNOSIS — E782 Mixed hyperlipidemia: Secondary | ICD-10-CM

## 2018-03-05 DIAGNOSIS — Q2112 Patent foramen ovale: Secondary | ICD-10-CM

## 2018-03-05 NOTE — Patient Instructions (Signed)
Medication Instructions:  Your physician recommends that you continue on your current medications as directed. Please refer to the Current Medication list given to you today.  If you need a refill on your cardiac medications before your next appointment, please call your pharmacy.   Lab work: None If you have labs (blood work) drawn today and your tests are completely normal, you will receive your results only by: . MyChart Message (if you have MyChart) OR . A paper copy in the mail If you have any lab test that is abnormal or we need to change your treatment, we will call you to review the results.  Testing/Procedures: None  Follow-Up: At [facility], you and your health needs are our priority.  As part of our continuing mission to provide you with exceptional heart care, we have created designated Provider Care Teams.  These Care Teams include your primary Cardiologist (physician) and Advanced Practice Providers (APPs -  Physician Assistants and Nurse Practitioners) who all work together to provide you with the care you need, when you need it. You will need a follow up appointment in 6 months.  Please call our office 2 months in advance to schedule this appointment. Any Other Special Instructions Will Be Listed Below (If Applicable).    

## 2018-03-05 NOTE — Progress Notes (Signed)
Cardiology Office Note:    Date:  03/05/2018   ID:  Gregg Guerrero, DOB Jul 01, 1950, MRN [id number]  PCP:  Sigmund Hazel, MD  Cardiologist:  Garwin Brothers, MD   Referring MD: Sigmund Hazel, MD    ASSESSMENT:    1. Transient cerebral ischemia, unspecified type   2. PFO (patent foramen ovale)   3. Mixed dyslipidemia   4. Paroxysmal atrial fibrillation (HCC)   5. S/P ablation of atrial fibrillation    PLAN:    In order of problems listed above:  1. Primary prevention stressed with the patient.  Importance of compliance with diet and medication stressed and he vocalized understanding.  His blood work including lipids are followed by his primary care physician. 2. I am concerned about his history of transient ischemic attacks, paroxysms of atrial fibrillation and also the fact that he has patent foramen ovale.  I gave him the following recommendations. 3. I told him that I would like him to be evaluated by electrophysiology colleagues to have a loop recorder.  This is because he has had a history of atrial fibrillation and TIAs and has a patent foramen ovale.  I would like to know for sure whether he goes into any recurrences of atrial fibrillation in which case he would need to be on anticoagulation because of history of TIAs.  I recommended anticoagulation for him with novel anticoagulants but he is not keen on it at this time.  Benefits and potential risks including having another stroke was discussed and he vocalized understanding.  I mentioned to him that if he has atrial fibrillation we can put him on novel anticoagulants and then he would not need or the chances of needing his PFO to be closed would probably not be low. 4. However if he does not have atrial fibrillation on the loop recorder then he could be considered for PFO closure by our structural heart colleagues.  It is especially in view of the fact that he has had multiple TIAs in the past.  I discussed these issues with him at  extensive length and questions were answered to his satisfaction.  He will get back in touch with me if he makes any changes in his decision.  Currently wants to keep the same medicines and talk to his primary care physician at length about the aforementioned choices. 5. Patient will be seen in follow-up appointment in 6 months or earlier if the patient has any concerns 6.    Medication Adjustments/Labs and Tests Ordered: Current medicines are reviewed at length with the patient today.  Concerns regarding medicines are outlined above.  No orders of the defined types were placed in this encounter.  No orders of the defined types were placed in this encounter.    No chief complaint on file.    History of Present Illness:    Gregg Guerrero is a 68 y.o. male.  Patient is a very interesting gentleman.  He has seen Dr. Donnie Aho in the past.  He mentions to me that he has had 2 TIAs in the past.  He also mentions to me that he has had atrial fibrillation with ablation.  He also has patent foramen ovale.  He is here for routine follow-up and to.  Being established with me.  He denies any chest pain orthopnea or PND.  He is a very active gentleman.  At the time of my evaluation, the patient is alert awake oriented and in no distress.  Past Medical  History:  Diagnosis Date  . Anxiety   . Arthritis   . Concussion 12/2014  . Dysrhythmia    hx afib, with ablation to correct  . GERD (gastroesophageal reflux disease)   . Headache   . History of kidney stones   . History of TIA (transient ischemic attack)    hx of 2  . Migraine    hx of  . PFO (patent foramen ovale)   . RLS (restless legs syndrome)   . Sleep apnea    mild-no cpap needed  . TIA (transient ischemic attack)     Past Surgical History:  Procedure Laterality Date  . BURR HOLE Right 07/20/2014   Procedure: Ezekiel Ina For Evacuation of SDH;  Surgeon: Julio Sicks, MD;  Location: [facility] NEURO ORS;  Service: Neurosurgery;  Laterality:  Right;  . CARDIAC CATHETERIZATION     2000  . CARDIAC ELECTROPHYSIOLOGY STUDY AND ABLATION  2009   done at [facility]  . CHEILECTOMY Left 02/03/2013   Procedure: LEFT HALLUX METATARSALPHALANGEAL JOINT CHEILECTOMY;  Surgeon: Toni Arthurs, MD;  Location:  SURGERY CENTER;  Service: Orthopedics;  Laterality: Left;  . COLONOSCOPY    . EXTRACORPOREAL SHOCK WAVE LITHOTRIPSY Right 02/11/2016   Procedure: RIGHT EXTRACORPOREAL SHOCK WAVE LITHOTRIPSY (ESWL);  Surgeon: Bjorn Pippin, MD;  Location: [facility]S;  Service: Urology;  Laterality: Right;  . FOOT ARTHROTOMY  2008   right foot   . SHOULDER SURGERY Left 2012   [facility] (Dr. Rennis Chris)  . TONSILLECTOMY      Current Medications: Current Meds  Medication Sig  . atorvastatin (LIPITOR) 10 MG tablet Take 1 tablet by mouth daily.  . cetirizine (ZYRTEC) 10 MG tablet Take 10 mg by mouth daily.  . Cholecalciferol (VITAMIN D) 2000 UNITS CAPS Take 14,000 Units by mouth once a week.   . clonazePAM (KLONOPIN) 1 MG tablet Take 1 mg by mouth at bedtime.  . clopidogrel (PLAVIX) 75 MG tablet Take 75 mg by mouth daily.  Marland Kitchen doxylamine, Sleep, (UNISOM) 25 MG tablet Take 25 mg by mouth at bedtime as needed for sleep.  . Ferrous Sulfate (IRON) 325 (65 FE) MG TABS Take 325 mg by mouth daily.   . fluticasone (FLONASE) 50 MCG/ACT nasal spray Place daily as needed into both nostrils.   Marland Kitchen MAGNESIUM PO Take by mouth. 150mg  po daily  . Multiple Vitamin (MULTIVITAMIN) tablet Take 1 tablet by mouth daily.  . Vit B6-Vit B12-Omega 3 Acids (VITAMIN B PLUS+ PO) Take by mouth. Unsure of which vit B     Allergies:   Patient has no known allergies.   Social History   Socioeconomic History  . Marital status: Married    Spouse name: Eunice Blase  . Number of children: 1  . Years of education: College   . Highest education level: Not on file  Occupational History    Employer: [employer]  Social Needs  . Financial resource strain: Not on file  . Food  insecurity:    Worry: Not on file    Inability: Not on file  . Transportation needs:    Medical: Not on file    Non-medical: Not on file  Tobacco Use  . Smoking status: Never Smoker  . Smokeless tobacco: Never Used  Substance and Sexual Activity  . Alcohol use: Yes    Comment: rare  . Drug use: No  . Sexual activity: Not on file  Lifestyle  . Physical activity:    Days per week: Not on file  Minutes per session: Not on file  . Stress: Not on file  Relationships  . Social connections:    Talks on phone: Not on file    Gets together: Not on file    Attends religious service: Not on file    Active member of club or organization: Not on file    Attends meetings of clubs or organizations: Not on file    Relationship status: Not on file  Other Topics Concern  . Not on file  Social History Narrative   Patient lives at home with wife.    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    Patient has a college education.    Patient has 1 child.    Patient quit caffeine use 2005.   Patient is right handed.      Family History: The patient's family history includes Congestive Heart Failure in his father; Stroke in his mother.  ROS:   Please see the history of present illness.    All other systems reviewed and are negative.  EKGs/Labs/Other Studies Reviewed:    The following studies were reviewed today: I discussed my findings with the patient at length.  EKG reveals sinus rhythm and nonspecific ST-T changes.   Recent Labs: No results found for requested labs within last 8760 hours.  Recent Lipid Panel No results found for: CHOL, TRIG, HDL, CHOLHDL, VLDL, LDLCALC, LDLDIRECT  Physical Exam:    VS:  BP 136/66 (BP Location: Right Arm, Patient Position: Sitting, Cuff Size: Normal)   Pulse 73   Ht 6\' 1"  (1.854 m)   Wt 203 lb (92.1 kg)   BMI 26.78 kg/m     Wt Readings from Last 3 Encounters:  03/05/18 203 lb (92.1 kg)  12/09/16 194 lb 9.6 oz (88.3 kg)  02/11/16  199 lb 9.6 oz (90.5 kg)     GEN: Patient is in no acute distress HEENT: Normal NECK: No JVD; No carotid bruits LYMPHATICS: No lymphadenopathy CARDIAC: Hear sounds regular, 2/6 systolic murmur at the apex. RESPIRATORY:  Clear to auscultation without rales, wheezing or rhonchi  ABDOMEN: Soft, non-tender, non-distended MUSCULOSKELETAL:  No edema; No deformity  SKIN: Warm and dry NEUROLOGIC:  Alert and oriented x 3 PSYCHIATRIC:  Normal affect   Signed, Garwin Brothersajan R Amani Marseille, MD  03/05/2018 4:50 PM    [facility]

## 2018-03-08 NOTE — Addendum Note (Signed)
Addended by: Crist Fat on: 03/08/2018 01:32 PM   Modules accepted: Orders

## 2018-11-29 ENCOUNTER — Ambulatory Visit (HOSPITAL_COMMUNITY)
Admission: RE | Admit: 2018-11-29 | Discharge: 2018-11-29 | Disposition: A | Discharge status: Home / Self Care | Payer: Medicare Other | Source: Ambulatory Visit | Attending: Cardiology | Admitting: Cardiology

## 2018-11-29 ENCOUNTER — Other Ambulatory Visit: Payer: Self-pay

## 2018-11-29 ENCOUNTER — Other Ambulatory Visit (HOSPITAL_COMMUNITY): Payer: Self-pay | Admitting: Orthopedic Surgery

## 2018-11-29 DIAGNOSIS — M79605 Pain in left leg: Secondary | ICD-10-CM

## 2018-11-29 DIAGNOSIS — M7989 Other specified soft tissue disorders: Secondary | ICD-10-CM | POA: Diagnosis not present

## 2019-01-28 HISTORY — PX: MEDIAL PARTIAL KNEE REPLACEMENT: SHX5965

## 2019-02-17 ENCOUNTER — Telehealth: Payer: Self-pay | Admitting: Cardiology

## 2019-02-17 NOTE — Telephone Encounter (Signed)
New Message:    Pt would like to change from Dr Tomie China to Dr Johney Frame. Is this alright with both of you?

## 2019-02-18 NOTE — Telephone Encounter (Signed)
I am happy to see him to discuss loop recorder as well as afib management post ablation as Dr Tomie China suggested in his last note.  He could still see Dr Tomie China for his general cardiology care.   Please schedule an EP appointment with me to discuss ILR implantation if OK with Dr Tomie China.

## 2019-02-21 NOTE — Progress Notes (Signed)
Per last office visit, RRR wanted patient evaluated by EP. Referral was never placed. Addendum on 02/21/19 by D.Sandre Kitty, RN

## 2019-02-21 NOTE — Addendum Note (Signed)
Addended by: Pamala Hurry on: 02/21/2019 09:30 AM   Modules accepted: Orders

## 2019-02-24 ENCOUNTER — Other Ambulatory Visit: Payer: Self-pay

## 2019-02-24 ENCOUNTER — Encounter: Payer: Self-pay | Admitting: Internal Medicine

## 2019-02-24 ENCOUNTER — Ambulatory Visit: Payer: Medicare Other | Admitting: Internal Medicine

## 2019-02-24 VITALS — BP 148/88 | HR 91

## 2019-02-24 DIAGNOSIS — Z9889 Other specified postprocedural states: Secondary | ICD-10-CM

## 2019-02-24 DIAGNOSIS — I480 Paroxysmal atrial fibrillation: Secondary | ICD-10-CM | POA: Diagnosis not present

## 2019-02-24 DIAGNOSIS — G459 Transient cerebral ischemic attack, unspecified: Secondary | ICD-10-CM

## 2019-02-24 DIAGNOSIS — Z8679 Personal history of other diseases of the circulatory system: Secondary | ICD-10-CM | POA: Diagnosis not present

## 2019-02-24 NOTE — Patient Instructions (Signed)
Medication Instructions:  Your physician recommends that you continue on your current medications as directed. Please refer to the Current Medication list given to you today.  *If you need a refill on your cardiac medications before your next appointment, please call your pharmacy*  Lab Work: None ordered If you have labs (blood work) drawn today and your tests are completely normal, you will receive your results only by: Marland Kitchen MyChart Message (if you have MyChart) OR . A paper copy in the mail If you have any lab test that is abnormal or we need to change your treatment, we will call you to review the results.  Testing/Procedures: None ordered  Follow-Up: At [facility], you and your health needs are our priority.  As part of our continuing mission to provide you with exceptional heart care, we have created designated Provider Care Teams.  These Care Teams include your primary Cardiologist (physician) and Advanced Practice Providers (APPs -  Physician Assistants and Nurse Practitioners) who all work together to provide you with the care you need, when you need it.  Your next appointment:   1 year(s)  The format for your next appointment:   Either In Person or Virtual  Provider:   Hillis Range, MD

## 2019-02-24 NOTE — Progress Notes (Signed)
Electrophysiology Office Note   Date:  02/24/2019   ID:  Gregg Guerrero, DOB [birth year], MRN [id number]  PCP:  Sigmund Hazel, MD  Cardiologist:  Dr Tomie China Primary Electrophysiologist: Hillis Range, MD    CC: TIA   History of Present Illness: Gregg Guerrero is a 69 y.o. male who presents today for electrophysiology evaluation.   He reports having a h/o afib for which he underwent afib ablation by Dr Sampson Goon at [facility] in 2009.  He has had no afib since that time.  He has been followed by Dr Donnie Aho and was eventually taken off of anticoagulation despite a remote history of TIA (around 2000). He remains active.   Today, he denies symptoms of palpitations, chest pain, shortness of breath, orthopnea, PND, lower extremity edema, claudication, dizziness, presyncope, syncope, bleeding, or neurologic sequela. The patient is tolerating medications without difficulties and is otherwise without complaint today.    Past Medical History:  Diagnosis Date  . Anxiety   . Arthritis   . Atrial fibrillation [facility])    s/p PVI at [facility] by Dr Sampson Goon in 2009  . Concussion 12/2014  . GERD (gastroesophageal reflux disease)   . Headache   . History of kidney stones   . History of TIA (transient ischemic attack)    hx of 2  . Migraine    hx of  . PFO (patent foramen ovale)   . RLS (restless legs syndrome)   . Sleep apnea    mild-no cpap needed  . TIA (transient ischemic attack)    Past Surgical History:  Procedure Laterality Date  . BURR HOLE Right 07/20/2014   Procedure: Ezekiel Ina For Evacuation of SDH;  Surgeon: Julio Sicks, MD;  Location: [facility] NEURO ORS;  Service: Neurosurgery;  Laterality: Right;  . CARDIAC CATHETERIZATION     2000  . CARDIAC ELECTROPHYSIOLOGY STUDY AND ABLATION  2009   PV[facility] by Dr Sampson [facility]oon at [facility]  . CHEILECTOMY Left 02/03/2013   Procedure: LEFT HALLUX METATARSALPHALANGEAL JOINT CHEILECTOMY;  Surgeon: Toni Arthurs, MD;  Location: [facility];  Service:  Orthopedics;  Laterality: Left;  . COLONOSCOPY    . EXTRACORPOREAL SHOCK WAVE LITHOTRIPSY Right 02/11/2016   Procedure: RIGHT EXTRACORPOREAL SHOCK WAVE LITHOTRIPSY (ESWL);  Surgeon: Bjorn Pippin, MD;  Location: [facility];  Service: Urology;  Laterality: Right;  . FOOT ARTHROTOMY  2008   right foot   . SHOULDER SURGERY Left 2012   [facility] (Dr. Rennis Chris)  . TONSILLECTOMY       Current Outpatient Medications  Medication Sig Dispense Refill  . atorvastatin (LIPITOR) 10 MG tablet Take 1 tablet by mouth daily.    . cetirizine (ZYRTEC) 10 MG tablet Take 10 mg by mouth daily.    . Cholecalciferol (VITAMIN D) 2000 UNITS CAPS Take 14,000 Units by mouth once a week.     . clonazePAM (KLONOPIN) 1 MG tablet Take 1 mg by mouth at bedtime.    . clopidogrel (PLAVIX) 75 MG tablet Take 75 mg by mouth daily.    . Ferrous Sulfate (IRON) 325 (65 FE) MG TABS Take 325 mg by mouth daily.     . fluticasone (FLONASE) 50 MCG/ACT nasal spray Place daily as needed into both nostrils.     Marland Kitchen latanoprost (XALATAN) 0.005 % ophthalmic solution Place 1 drop into both eyes daily.    Marland Kitchen MAGNESIUM PO Take by mouth. 150mg  po daily    . Multiple Vitamin (MULTIVITAMIN) tablet Take 1 tablet by mouth daily.     Vit B6-Vit B12-Omega 3 Acids (VITAMIN B PLUS+ PO) Take by mouth. Unsure of which vit B     No current facility-administered medications for this visit.    Allergies:   Patient has no known allergies.   Social History:  The patient  reports that he has never smoked. He has never used smokeless tobacco. He reports current alcohol use. He reports that he does not use drugs.   Family History:  The patient's family history includes Congestive Heart Failure in his father; Stroke in his mother.    ROS:  Please see the history of present illness.   All other systems are personally reviewed and negative.    PHYSICAL EXAM: VS:  BP (!) 148/88   Pulse 91  , BMI There is no height or weight on file to calculate BMI.  GEN: Well nourished, well developed, in no acute distress  HEENT: normal  Neck: no JVD, carotid bruits, or masses Cardiac: RRR; no murmurs, rubs, or gallops,no edema  Respiratory:  clear to auscultation bilaterally, normal work of breathing GI: soft, nontender, nondistended, + BS MS: no deformity or atrophy  Skin: warm and dry  Neuro:  Strength and sensation are intact Psych: euthymic mood, full affect  Recent Labs: No results found for requested labs within last 8760 hours.  personally reviewed   Lipid Panel  No results found for: CHOL, TRIG, HDL, CHOLHDL, VLDL, LDLCALC, LDLDIRECT personally reviewed   Wt Readings from Last 3 Encounters:  03/05/18 203 lb (92.1 kg)  12/09/16 194 lb 9.6 oz (88.3 kg)  02/11/16 199 lb 9.6 oz (90.5 kg)     Other studies personally reviewed: Additional studies/ records that were reviewed today include: Dr Julien Nordmann notes  Review of the above records today demonstrates: as above   ASSESSMENT AND PLAN:  1.  Atrial fibrillation Well controlled post ablation He has not had symptomatic afib in 10 years. His chads2vasc score is at least 3 and includes prior TIA.  We discussed guidelines as well as data suggesting that plavix is not an appropriate anticoagulant for afib to prevent stroke.  As he has done so well for so long, he is not interested in making any changes at this time. I did suggest that we proceed with implantation of an implantable loop recorder to monitor for afib and for afib management post ablation.  He declines ILR at this time. He wishes to continue a conservative approach but may be more willing to consider in the future.   Follow-up:  With Dr Geraldo Pitter as scheduled I will see as needed  Current medicines are reviewed at length with the patient today.   The patient does not have concerns regarding his medicines.  The following changes were made today:  none  Labs/ tests ordered today include:  No orders of the defined types  were placed in this encounter.    Army Fossa, MD  02/24/2019 1:28 PM     [place] [id number] [phone] (office) [phone] (fax)

## 2019-03-01 NOTE — Addendum Note (Signed)
Addended by: Uzoma Vivona H on: 03/01/2019 05:12 PM   Modules accepted: Orders  

## 2019-03-07 ENCOUNTER — Ambulatory Visit: Payer: Medicare Other | Attending: Internal Medicine

## 2019-03-07 ENCOUNTER — Institutional Professional Consult (permissible substitution): Payer: Medicare Other | Admitting: Internal Medicine

## 2019-03-07 DIAGNOSIS — Z23 Encounter for immunization: Secondary | ICD-10-CM | POA: Insufficient documentation

## 2019-03-07 NOTE — Progress Notes (Signed)
   Covid-19 Vaccination Clinic  Name:  Gregg Guerrero    MRN: [id number] DOB: April 03, 1950  03/07/2019  [name] was observed post Covid-19 immunization for 15 minutes without incidence. He was provided with Vaccine Information Sheet and instruction to access the V-Safe system.   [name] was instructed to call 911 with any severe reactions post vaccine: Marland Kitchen Difficulty breathing  . Swelling of your face and throat  . A fast heartbeat  . A bad rash all over your body  . Dizziness and weakness    Immunizations Administered    Name Date Dose VIS Date Route   Pfizer COVID-19 Vaccine 03/07/2019  8:25 AM 0.3 mL 01/07/2019 Intramuscular   Manufacturer: [employer], [employer]   Lot: [id number]   NDC: [address]-7

## 2019-03-25 ENCOUNTER — Ambulatory Visit: Payer: Medicare Other

## 2019-03-31 ENCOUNTER — Ambulatory Visit: Payer: Medicare Other | Attending: Internal Medicine

## 2019-03-31 DIAGNOSIS — Z23 Encounter for immunization: Secondary | ICD-10-CM | POA: Insufficient documentation

## 2019-03-31 NOTE — Progress Notes (Signed)
   Covid-19 Vaccination Clinic  Name:  Gregg Guerrero    MRN: [id number] DOB: April 23, 1950  03/31/2019  Mr. Gregg Guerrero was observed post Covid-19 immunization for 15 minutes without incident. He was provided with Vaccine Information Sheet and instruction to access the V-Safe system.   Mr. Gregg Guerrero was instructed to call 911 with any severe reactions post vaccine: Marland Kitchen Difficulty breathing  . Swelling of face and throat  . A fast heartbeat  . A bad rash all over body  . Dizziness and weakness   Immunizations Administered    Name Date Dose VIS Date Route   Pfizer COVID-19 Vaccine 03/31/2019  4:05 PM 0.3 mL 01/07/2019 Intramuscular   Manufacturer: [employer], [employer]   Lot: [id number]   NDC: [address]-3

## 2019-05-23 ENCOUNTER — Telehealth: Payer: Self-pay | Admitting: *Deleted

## 2019-05-23 NOTE — Telephone Encounter (Signed)
   [facility] Pre-operative Risk Assessment    Request for surgical clearance:  1. What type of surgery is being performed? LEFT KNEE UNICOMPARTMENTAL ARTHROSCOPY   2. When is this surgery scheduled? 06/28/19   3. What type of clearance is required (medical clearance vs. Pharmacy clearance to hold med vs. Both)? MEDICAL  4. Are there any medications that need to be held prior to surgery and how long? PLAVIX    5. Practice name and name of physician performing surgery? [facility]; DR. FRANK ALUISIO   6. What is your office phone number [phone]    7.   What is your office fax number [phone] ATTN: KELLY HANCOCK  8.   Anesthesia type (None, local, MAC, general) ? CHOICE   Gregg Guerrero 05/23/2019, 2:31 PM  _________________________________________________________________   (provider comments below)

## 2019-05-24 NOTE — Telephone Encounter (Signed)
   Cardiologist:  Dr Tomie China Primary Electrophysiologist: Hillis Range, MD        Chart reviewed as part of pre-operative protocol coverage. Left voicemail to call back. Hx of remote afib s/p ablation, TIA and PFO. He has been on Plavix for anticoagulation. He has declined other therapy and ILR. Dr. Revanker/Dr. Johney Frame, can he hold Plavix and any other recommendations?  Please forward your response to P CV DIV PREOP.   Thank you  [name], PA 05/24/2019, 12:29 PM

## 2019-05-24 NOTE — Telephone Encounter (Signed)
Spoke with the patient.  Limited exercise but easily getting greater than 4 METS of activity.  He is only like to follow-up with Dr. Johney Frame annually for his atrial fibrillation.  Given past medical history and time since last visit, based on ACC/AHA guidelines, Gregg Guerrero would be at acceptable risk for the planned procedure without further cardiovascular testing.

## 2019-05-24 NOTE — Telephone Encounter (Signed)
Left Voicemail to call back

## 2019-05-24 NOTE — Telephone Encounter (Signed)
I have not seen this patient since February of last year.  If you feel appropriate then please have him see me and I can make those decisions.  Thank you

## 2019-05-25 ENCOUNTER — Encounter: Payer: Self-pay | Admitting: Cardiology

## 2019-05-25 NOTE — Telephone Encounter (Signed)
error 

## 2019-05-25 NOTE — Telephone Encounter (Signed)
I s/w the pt in regards to needing an appt with his Gen Card Dr. Tomie Guerrero in our [facility] office for pre op clearance. Pt is adamant about not seeing Dr. Tomie Guerrero any further. I offered an appt today to see Gregg Ripple, DO for pre op clearance in [place]. Pt states he is only 10 minutes from the [facility] Office and would like to be seen at the [employer]. I d/w the pt that if he is wanting to change providers there is a protocol that we need to follow. Pt said he requested this back a few months ago. I reviewed the chart and 02/17/19 pt did request to see only Dr. Johney Frame and a message was sent to both cardiologist for approval. On 02/18/19  Gregg Range, MD  Physician  Specialty:  Cardiology  Telephone Encounter  Signed  Encounter Date:  02/17/2019          Signed         Show:Clear all [x] Manual[] Template[] Copied  Added by: [x] , MD  [] Hover for details I am happy to see him to discuss loop recorder as well as afib management post ablation as Dr suggested in his last note.  He could still see Dr Gregg Guerrero for his general cardiology care.   Please schedule an EP appointment with me to discuss ILR implantation if OK with Dr .        Electronically signed by Gregg China, MD at 02/18/2019 10:18 PM   I tried to explain to the pt the EP doc are generally not in the office as often as the Gen cards as they are in the hospital more doing procedures. Pt is only wanting Dr. Tomie Guerrero to be his only cardiologist. I assured the pt that I will make my notes today and we will need to d/w Dr. Hillis Guerrero for further advice. Pt thanked me for the call.

## 2019-05-25 NOTE — Telephone Encounter (Signed)
   Primary Cardiologist: Garwin Brothers, MD  Chart reviewed as part of pre-operative protocol coverage. This patient is followed as needed by EP for his Afib as well as Dr. Tomie China (seen 02/2018). Also with TIA and PFO. Had been a patient of Dr. Donnie Aho in the past. Last office visit with you 1/21 patient was not interested in being on [facility]. The issue of plavix was discussed as not being the appropriate anticoagulant for Afib.   Now needing Left knee arthroscopy and recommendations regarding holding plavix are requested. This was routed to Dr. Tomie China with recommendations for outpatient office visit to discuss. Patient is adamant that he is not interested in seeing Dr. Tomie China, only wants to follow with you.   Would you be ok with giving recommendations regarding holding plavix? Otherwise will need to schedule patient for an office visit.   - Please route response to P CV DIV PREOP (the pre-op pool). Thank you  Laverda Page, NP 05/25/2019, 4:19 PM

## 2019-05-25 NOTE — Telephone Encounter (Signed)
   Primary Cardiologist:Rajan R Revankar, MD  Chart reviewed as part of pre-operative protocol coverage. Because of Gregg Guerrero's past medical history and time since last visit, he/she will require a follow-up visit in order to better assess preoperative cardiovascular risk. The patient is on plavix for a history of TIA. Also with history of atrial fibrillation.  He is also seen by Dr. Johney Frame regarding his atrial fibrillation. It is requested that he follow up with Dr. Johna Sheriff for ongoing care and Dr. Johney Frame to see as needed. Per notes Dr. Johney Frame would not be the one to give guidance on holding plavix because he was not the one to previously prescribe this medication for the patient. Based on his past medical history, Dr. Tomie China request that he be seen in his clinic to follow up to make recommendations regarding antiplatelet prior to surgery.  Pre-op covering staff: - Please schedule appointment and call patient to inform them. - Please contact requesting surgeon's office via preferred method (i.e, phone, fax) to inform them of need for appointment prior to surgery.   Laverda Page, NP  05/25/2019, 11:33 AM

## 2019-05-25 NOTE — Telephone Encounter (Signed)
I have sent a message to scheduling team to please reach out to the pt with an appt for Dr. Josiah Lobo or APP for pre op clearance.

## 2019-05-25 NOTE — Telephone Encounter (Signed)
Called patient to schedule appointment. He states he does not want to be seen by Dr. Tomie China. Per Dava Najjar Pre Op Pool will be responsible for scheduling the pre op clearance appointment.

## 2019-05-27 NOTE — Telephone Encounter (Signed)
   Primary Cardiologist: Garwin Brothers, MD  Chart reviewed as part of pre-operative protocol coverage. Patient was contacted 05/27/2019 in reference to pre-operative risk assessment for pending surgery as outlined below.  Gregg Guerrero was last seen on 02/24/2019 by Dr. Johney Frame.  Since that day, Gregg Guerrero has done well from a cardiac standpoint. No complaints of chest pain, SOB, or palpitations. Limited in mobility by knee pain, though can easily complete 4 METs without anginal complaints.   Therefore, based on ACC/AHA guidelines, the patient would be at acceptable risk for the planned procedure without further cardiovascular testing.   On further review of the patient's chart, he is on plavix for TIA history. There is no cardiac contraindication to holding plavix prior to surgery, though will defer final recommendations for holding plavix to the patients PCP who manages this medication.   I will route this recommendation to the requesting party via Epic fax function and remove from pre-op pool.  Please call with questions.  Beatriz Stallion, PA-C 05/27/2019, 5:31 PM

## 2019-05-27 NOTE — Telephone Encounter (Signed)
S/w the pt who is checking to see if Dr. Johney Frame is agreeable to seeing him Gregg Guerrero his pre op and as his primary card. I have assured the pt that I have sent my notes from Wed to the pre op team to follow up with Dr. Johney Frame. Pt states he is going on vacation as of tomorrow though we may reach him through his cell phone. If we do get his vm he has stated ok to leave detailed information on vm. I assured the pt that I will touch base with our pre op team today and see if they have heard anything from Dr. Johney Frame. Pt has thanked me for the help.

## 2019-05-27 NOTE — Telephone Encounter (Signed)
Patient called wanting to speak to Okey Regal, he stated he is returning her call.

## 2019-05-27 NOTE — Telephone Encounter (Signed)
Patient called back returning Carol's Call

## 2020-02-27 ENCOUNTER — Other Ambulatory Visit: Payer: Self-pay

## 2020-02-27 ENCOUNTER — Encounter: Payer: Self-pay | Admitting: Internal Medicine

## 2020-02-27 ENCOUNTER — Ambulatory Visit: Payer: Medicare Other | Admitting: Internal Medicine

## 2020-02-27 VITALS — BP 116/80 | HR 84 | Ht 73.0 in | Wt 201.6 lb

## 2020-02-27 DIAGNOSIS — I480 Paroxysmal atrial fibrillation: Secondary | ICD-10-CM | POA: Diagnosis not present

## 2020-02-27 NOTE — Patient Instructions (Addendum)
Medication Instructions:  Your physician recommends that you continue on your current medications as directed. Please refer to the Current Medication list given to you today.  *If you need a refill on your cardiac medications before your next appointment, please call your pharmacy*   Lab Work: None ordered If you have labs (blood work) drawn today and your tests are completely normal, you will receive your results only by: Marland Kitchen MyChart Message (if you have MyChart) OR . A paper copy in the mail If you have any lab test that is abnormal or we need to change your treatment, we will call you to review the results.   Testing/Procedures: None ordered   Follow-Up: At [facility], you and your health needs are our priority.  As part of our continuing mission to provide you with exceptional heart care, we have created designated Provider Care Teams.  These Care Teams include your primary Cardiologist (physician) and Advanced Practice Providers (APPs -  Physician Assistants and Nurse Practitioners) who all work together to provide you with the care you need, when you need it.  We recommend signing up for the patient portal called "MyChart".  Sign up information is provided on this After Visit Summary.  MyChart is used to connect with patients for Virtual Visits (Telemedicine).  Patients are able to view lab/test results, encounter notes, upcoming appointments, etc.  Non-urgent messages can be sent to your provider as well.   To learn more about what you can do with MyChart, go to [email or web address].    Your next appointment:   12 month(s)  The format for your next appointment:   In Person  Provider:   Francis Dowse, PA    Thank you for choosing [facility]!!

## 2020-02-27 NOTE — Progress Notes (Signed)
PCP: Sigmund Hazel, MD Primary Cardiologist: Dr Tomie China Primary EP: Dr Antionette Fairy Gregg Guerrero is a 70 y.o. male who presents today for routine electrophysiology followup.  Since last being seen in our clinic, the patient reports doing very well.  Today, he denies symptoms of palpitations, chest pain, shortness of breath,  lower extremity edema, dizziness, presyncope, or syncope.  The patient is otherwise without complaint today.   Past Medical History:  Diagnosis Date  . Anxiety   . Arthritis   . Atrial fibrillation [facility])    s/p PVI at [facility] by Dr Sampson Goon in 2009  . Concussion 12/2014  . GERD (gastroesophageal reflux disease)   . Headache   . History of kidney stones   . History of TIA (transient ischemic attack)    hx of 2  . Migraine    hx of  . PFO (patent foramen ovale)   . RLS (restless legs syndrome)   . Sleep apnea    mild-no cpap needed  . TIA (transient ischemic attack)    Past Surgical History:  Procedure Laterality Date  . BURR HOLE Right 07/20/2014   Procedure: Ezekiel Ina For Evacuation of SDH;  Surgeon: Julio Sicks, MD;  Location: [facility] NEURO ORS;  Service: Neurosurgery;  Laterality: Right;  . CARDIAC CATHETERIZATION     2000  . CARDIAC ELECTROPHYSIOLOGY STUDY AND ABLATION  2009   [facility] Dr Sampson [facility]oon [facility]  . CHEILECTOMY Left 02/03/2013   Procedure: LEFT HALLUX METATARSALPHALANGEAL JOINT CHEILECTOMY;  Surgeon: Toni Arthurs, MD;  Location: [facility];  Service: Orthopedics;  Laterality: Left;  . COLONOSCOPY    . EXTRACORPOREAL SHOCK WAVE LITHOTRIPSY Right 02/11/2016   Procedure: RIGHT EXTRACORPOREAL SHOCK WAVE LITHOTRIPSY (ESWL);  Surgeon: Bjorn Pippin, MD;  Location: [facility] ORS;  Service: Urology;  Laterality: Right;  . FOOT ARTHROTOMY  2008   right foot   . SHOULDER SURGERY Left 2012   [facility] (Dr. Rennis Chris)  . TONSILLECTOMY      ROS- all systems are reviewed and negatives except as per HPI above  Current Outpatient Medications   Medication Sig Dispense Refill  . atorvastatin (LIPITOR) 10 MG tablet Take 1 tablet by mouth daily.    . cetirizine (ZYRTEC) 10 MG tablet Take 10 mg by mouth daily.    . Cholecalciferol (VITAMIN D) 2000 UNITS CAPS Take 14,000 Units by mouth once a week.     . clonazePAM (KLONOPIN) 1 MG tablet Take 1 mg by mouth at bedtime.    . clopidogrel (PLAVIX) 75 MG tablet Take 75 mg by mouth daily.    . Ferrous Sulfate (IRON) 325 (65 FE) MG TABS Take 325 mg by mouth daily.     . fluticasone (FLONASE) 50 MCG/ACT nasal spray Place daily as needed into both nostrils.     Marland Kitchen gabapentin (NEURONTIN) 300 MG capsule Take 300 mg by mouth at bedtime.    Marland Kitchen latanoprost (XALATAN) 0.005 % ophthalmic solution Place 1 drop into both eyes daily.    Marland Kitchen MAGNESIUM PO Take by mouth. 150mg  po daily    . Multiple Vitamin (MULTIVITAMIN) tablet Take 1 tablet by mouth daily.    . Vit B6-Vit B12-Omega 3 Acids (VITAMIN B PLUS+ PO) Take by mouth. Unsure of which vit B     No current facility-administered medications for this visit.    Physical Exam: Vitals:   02/27/20 1352  BP: 116/80  Pulse: 84  SpO2: 92%  Weight: 201 lb 9.6 oz (91.4 kg)  Height:  6\' 1"  (1.854 m)    GEN- The patient is well appearing, alert and oriented x 3 today.   Head- normocephalic, atraumatic Eyes-  Sclera clear, conjunctiva pink Ears- hearing intact Oropharynx- clear Lungs- Clear to ausculation bilaterally, normal work of breathing Heart- Regular rate and rhythm, no murmurs, rubs or gallops, PMI not laterally displaced GI- soft, NT, ND, + BS Extremities- no clubbing, cyanosis, or edema  Wt Readings from Last 3 Encounters:  02/27/20 201 lb 9.6 oz (91.4 kg)  03/05/18 203 lb (92.1 kg)  12/09/16 194 lb 9.6 oz (88.3 kg)    EKG tracing ordered today is personally reviewed and shows sinus  Assessment and Plan:  1. Atrial fibrillation No afib in 11 years post ablation by Dr 12/11/16 chad2vasc score is 3, with prior TIA.  He declines OAC and  prefers plavix.  2. HL Continue statin  Risks, benefits and potential toxicities for medications prescribed and/or refilled reviewed with patient today   Return annually  Sampson Goon MD, [facility] 02/27/2020 2:01 PM

## 2021-03-05 ENCOUNTER — Telehealth: Payer: Self-pay

## 2021-03-05 ENCOUNTER — Ambulatory Visit: Payer: Self-pay | Admitting: General Surgery

## 2021-03-05 NOTE — Telephone Encounter (Signed)
° °  Pre-operative Risk Assessment    Patient Name: Gregg Guerrero  DOB: June 28, 1950 MRN: [id number]      Request for Surgical Clearance    Procedure:   HERNIA SURGERY  Date of Surgery:  Clearance TBD                                 Surgeon:  DR. [name] Surgeon's Group or Practice Name:  [facility] Phone number:  [phone]  Fax number:  [phone] ATTN MICHELLE BROOKS, CMA   Type of Clearance Requested:   - Medical  - Pharmacy:  Hold Clopidogrel (Plavix)     Type of Anesthesia:  General    Additional requests/questions:    SignedMichaelle Copas   03/05/2021, 4:02 PM

## 2021-03-06 NOTE — Telephone Encounter (Signed)
° °  Name: Gregg Guerrero  DOB: Feb 21, 1950  MRN: [id number]  Primary Cardiologist: Garwin Brothers, MD  Chart reviewed as part of pre-operative protocol coverage. Because of [name]'s past medical history and time since last visit, he will require a follow-up visit in order to better assess preoperative cardiovascular risk.  Last OV was >1 year ago in 01/2020.  I do not see that we prescribe his Plavix therefore would suggest that surgical team reach out to the team that does prescribe his Plavix. He has declined traditional anticoagulation through our office. This can be clarified further at OV in case he elects to transition to oral anticoagulation.  Pre-op covering staff: - Please schedule appointment and call patient to inform them. - Please contact requesting surgeon's office via preferred method (i.e, phone, fax) to inform them of need for appointment and Plavix recommendations above prior to surgery.  Laurann Montana, PA-C  03/06/2021, 11:01 AM

## 2021-03-06 NOTE — Telephone Encounter (Signed)
Spoke with patient and got him scheduled for 04/04/21 with Francis Dowse he voiced understanding.

## 2021-03-08 ENCOUNTER — Other Ambulatory Visit: Payer: Self-pay | Admitting: General Surgery

## 2021-03-08 DIAGNOSIS — K4090 Unilateral inguinal hernia, without obstruction or gangrene, not specified as recurrent: Secondary | ICD-10-CM

## 2021-03-19 ENCOUNTER — Ambulatory Visit
Admission: RE | Admit: 2021-03-19 | Discharge: 2021-03-19 | Disposition: A | Discharge status: Home / Self Care | Payer: Medicare Other | Source: Ambulatory Visit | Attending: General Surgery | Admitting: General Surgery

## 2021-03-19 DIAGNOSIS — K4090 Unilateral inguinal hernia, without obstruction or gangrene, not specified as recurrent: Secondary | ICD-10-CM

## 2021-03-19 MED ORDER — IOPAMIDOL (ISOVUE-300) INJECTION 61%
100.0000 mL | Freq: Once | INTRAVENOUS | Status: AC | PRN
  Administered 2021-03-19: 100 mL via INTRAVENOUS

## 2021-03-21 ENCOUNTER — Ambulatory Visit (HOSPITAL_BASED_OUTPATIENT_CLINIC_OR_DEPARTMENT_OTHER): Payer: Medicare Other | Admitting: Internal Medicine

## 2021-03-21 ENCOUNTER — Encounter (HOSPITAL_BASED_OUTPATIENT_CLINIC_OR_DEPARTMENT_OTHER): Payer: Self-pay | Admitting: Internal Medicine

## 2021-03-21 ENCOUNTER — Other Ambulatory Visit: Payer: Self-pay

## 2021-03-21 VITALS — BP 134/78 | HR 81 | Ht 73.0 in | Wt 190.0 lb

## 2021-03-21 DIAGNOSIS — I480 Paroxysmal atrial fibrillation: Secondary | ICD-10-CM | POA: Diagnosis not present

## 2021-03-21 DIAGNOSIS — E782 Mixed hyperlipidemia: Secondary | ICD-10-CM

## 2021-03-21 NOTE — Progress Notes (Signed)
PCP: Sigmund Hazel, MD Primary Cardiologist: Dr Tomie China Primary EP: Dr Antionette Fairy Gregg Guerrero is a 71 y.o. male who presents today for routine electrophysiology followup.  Since last being seen in our clinic, the patient reports doing very well.  Today, he denies symptoms of palpitations, chest pain, shortness of breath,  lower extremity edema, dizziness, presyncope, or syncope.  The patient is otherwise without complaint today.   Past Medical History:  Diagnosis Date   Anxiety    Arthritis    Atrial fibrillation [facility])    s/p PVI at [facility] by Dr Sampson Goon in 2009   Concussion 12/2014   GERD (gastroesophageal reflux disease)    Headache    History of kidney stones    History of TIA (transient ischemic attack)    hx of 2   Migraine    hx of   PFO (patent foramen ovale)    RLS (restless legs syndrome)    Sleep apnea    mild-no cpap needed   TIA (transient ischemic attack)    Past Surgical History:  Procedure Laterality Date   BURR HOLE Right 07/20/2014   Procedure: Ezekiel Ina For Evacuation of SDH;  Surgeon: Julio Sicks, MD;  Location: [facility] NEURO ORS;  Service: Neurosurgery;  Laterality: Right;   CARDIAC CATHETERIZATION     2000   CARDIAC ELECTROPHYSIOLOGY STUDY AND ABLATION  2009   PVI by Dr Sampson Goon at [facility]   CHEILECTOMY Left 02/03/2013   Procedure: LEFT HALLUX METATARSALPHALANGEAL JOINT CHEILECTOMY;  Surgeon: Toni Arthurs, MD;  Location: [facility];  Service: Orthopedics;  Laterality: Left;   COLONOSCOPY     EXTRACORPOREAL SHOCK WAVE LITHOTRIPSY Right 02/11/2016   Procedure: RIGHT EXTRACORPOREAL SHOCK WAVE LITHOTRIPSY (ESWL);  Surgeon: Bjorn Pippin, MD;  Location: [facility];  Service: Urology;  Laterality: Right;   FOOT ARTHROTOMY  2008   right foot    SHOULDER SURGERY Left 2012   [facility] (Dr. Rennis Chris)   TONSILLECTOMY      ROS- all systems are reviewed and negatives except as per HPI above  Current Outpatient Medications  Medication Sig  Dispense Refill   atorvastatin (LIPITOR) 10 MG tablet Take 1 tablet by mouth daily.     cetirizine (ZYRTEC) 10 MG tablet Take 10 mg by mouth daily.     Cholecalciferol (VITAMIN D) 2000 UNITS CAPS Take 14,000 Units by mouth once a week.      clonazePAM (KLONOPIN) 1 MG tablet Take 1 mg by mouth at bedtime.     clopidogrel (PLAVIX) 75 MG tablet Take 75 mg by mouth daily.     Ferrous Sulfate (IRON) 325 (65 FE) MG TABS Take 325 mg by mouth daily.      fluticasone (FLONASE) 50 MCG/ACT nasal spray Place daily as needed into both nostrils.      gabapentin (NEURONTIN) 300 MG capsule Take 300 mg by mouth at bedtime.     latanoprost (XALATAN) 0.005 % ophthalmic solution Place 1 drop into both eyes daily.     MAGNESIUM PO Take by mouth. 150mg  po daily     Multiple Vitamin (MULTIVITAMIN) tablet Take 1 tablet by mouth daily.     Vit B6-Vit B12-Omega 3 Acids (VITAMIN B PLUS+ PO) Take by mouth. Unsure of which vit B     No current facility-administered medications for this visit.    Physical Exam: Vitals:   03/21/21 1545  BP: 134/78  Pulse: 81  SpO2: 95%  Weight: 190 lb (86.2 kg)  Height: 6'  1" (1.854 m)    GEN- The patient is well appearing, alert and oriented x 3 today.   Head- normocephalic, atraumatic Eyes-  Sclera clear, conjunctiva pink Ears- hearing intact Oropharynx- clear Lungs- Clear to ausculation bilaterally, normal work of breathing Heart- Regular rate and rhythm, no murmurs, rubs or gallops, PMI not laterally displaced GI- soft, NT, ND, + BS Extremities- no clubbing, cyanosis, or edema  Wt Readings from Last 3 Encounters:  03/21/21 190 lb (86.2 kg)  02/27/20 201 lb 9.6 oz (91.4 kg)  03/05/18 203 lb (92.1 kg)    EKG tracing ordered today is personally reviewed and shows sinus  Assessment and Plan:  Atrial fibrillation No afib in 12 years post ablation  Chads2vasc score is 3, with prior TIA.  He declines OAC and prefers plavix  2. HL Stable No change required  today  3. Preop He is scheduled to have hernia repair Ok to proceed without further CV testing.   Return to see mein a year  Hillis Range MD, [facility] 03/21/2021 4:04 PM

## 2021-03-21 NOTE — Patient Instructions (Addendum)

## 2021-04-05 ENCOUNTER — Ambulatory Visit: Payer: Medicare Other | Admitting: Physician Assistant

## 2021-05-08 NOTE — Progress Notes (Signed)
Surgical Instructions ? ? ? Your procedure is scheduled on 05/16/21. ? Report to [facility] Main Entrance "A" at 6:30 A.M., then check in with the Admitting office. ? Call this number if you have problems the morning of surgery: ? [phone] ? ? If you have any questions prior to your surgery date call [phone]: Open Monday-Friday 8am-4pm ? ? ? Remember: ? Do not eat after midnight the night before your surgery ? ?You may drink clear liquids until 5:30 the morning of your surgery.   ?Clear liquids allowed are: Water, Non-Citrus Juices (without pulp), Carbonated Beverages, Clear Tea, Black Coffee ONLY (NO MILK, CREAM OR POWDERED CREAMER of any kind), and Gatorade ?  ? Take these medicines the morning of surgery with A SIP OF WATER:  ?atorvastatin (LIPITOR)  ?latanoprost (XALATAN)  ?loratadine (CLARITIN) ? ?AS NEEDED: ?acetaminophen (TYLENOL) ?fluticasone (FLONASE) ? ?Follow your surgeon's instructions on when to stop PLAVIX.  If no instructions were given by your surgeon then you will need to call the office to get those instructions.    ? ? ?As of today, STOP taking any Aspirin (unless otherwise instructed by your surgeon) Aleve, Naproxen, Ibuprofen, Motrin, Advil, Goody's, BC's, all herbal medications, fish oil, and all vitamins. ? ?         ?Do not wear jewelry  ?Do not wear lotions, powders, colognes, or deodorant. ?Do not shave 48 hours prior to surgery.  Men may shave face and neck. ?Do not bring valuables to the hospital. ? ? ?[name] is not responsible for any belongings or valuables. .  ? ?Do NOT Smoke (Tobacco/Vaping)  24 hours prior to your procedure ? ?If you use a CPAP at night, you may bring your mask for your overnight stay. ?  ?Contacts, glasses, hearing aids, dentures or partials may not be worn into surgery, please bring cases for these belongings ?  ?For patients admitted to the hospital, discharge time will be determined by your treatment team. ?  ?Patients discharged the day of surgery  will not be allowed to drive home, and someone needs to stay with them for 24 hours. ? ? ?SURGICAL WAITING ROOM VISITATION ?Patients having surgery or a procedure in a hospital may have two support people. ?Children under the age of 3 must have an adult with them who is not the patient. ?They may stay in the waiting area during the procedure and may switch out with other visitors. If the patient needs to stay at the hospital during part of their recovery, the visitor guidelines for inpatient rooms apply. ? ?Please refer to the [facility] website for the visitor guidelines for Inpatients (after your surgery is over and you are in a regular room).  ? ? ? ?Special instructions:   ? ?Oral Hygiene is also important to reduce your risk of infection.  Remember - BRUSH YOUR TEETH THE MORNING OF SURGERY WITH YOUR REGULAR TOOTHPASTE ? ? ?[facility]- Preparing For Surgery ? ?Before surgery, you can play an important role. Because skin is not sterile, your skin needs to be as free of germs as possible. You can reduce the number of germs on your skin by washing with CHG (chlorahexidine gluconate) Soap before surgery.  CHG is an antiseptic cleaner which kills germs and bonds with the skin to continue killing germs even after washing.   ? ? ?Please do not use if you have an allergy to CHG or antibacterial soaps. If your skin becomes reddened/irritated stop using the CHG.  ?Do not  shave (including legs and underarms) for at least 48 hours prior to first CHG shower. It is OK to shave your face. ? ?Please follow these instructions carefully. ?  ? ? Shower the NIGHT BEFORE SURGERY and the MORNING OF SURGERY with CHG Soap.  ? If you chose to wash your hair, wash your hair first as usual with your normal shampoo. After you shampoo, rinse your hair and body thoroughly to remove the shampoo.  Then [employer] and genitals (private parts) with your normal soap and rinse thoroughly to remove soap. ? ?After that Use CHG Soap as you would any  other liquid soap. You can apply CHG directly to the skin and wash gently with a scrungie or a clean washcloth.  ? ?Apply the CHG Soap to your body ONLY FROM THE NECK DOWN.  Do not use on open wounds or open sores. Avoid contact with your eyes, ears, mouth and genitals (private parts). Wash Face and genitals (private parts)  with your normal soap.  ? ?Wash thoroughly, paying special attention to the area where your surgery will be performed. ? ?Thoroughly rinse your body with warm water from the neck down. ? ?DO NOT shower/wash with your normal soap after using and rinsing off the CHG Soap. ? ?Pat yourself dry with a CLEAN TOWEL. ? ?Wear CLEAN PAJAMAS to bed the night before surgery ? ?Place CLEAN SHEETS on your bed the night before your surgery ? ?DO NOT SLEEP WITH PETS. ? ? ?Day of Surgery: ? ?Take a shower with CHG soap. ?Wear Clean/Comfortable clothing the morning of surgery ?Do not apply any deodorants/lotions.   ?Remember to brush your teeth WITH YOUR REGULAR TOOTHPASTE. ? ? ? ?If you received a COVID test during your pre-op visit  it is requested that you wear a mask when out in public, stay away from anyone that may not be feeling well and notify your surgeon if you develop symptoms. If you have been in contact with anyone that has tested positive in the last 10 days please notify you surgeon. ? ?  ?Please read over the following fact sheets that you were given.   ?

## 2021-05-09 ENCOUNTER — Other Ambulatory Visit: Payer: Self-pay

## 2021-05-09 ENCOUNTER — Encounter (HOSPITAL_COMMUNITY)
Admission: RE | Admit: 2021-05-09 | Discharge: 2021-05-09 | Disposition: A | Discharge status: Home / Self Care | Payer: Medicare Other | Source: Ambulatory Visit | Attending: General Surgery | Admitting: General Surgery

## 2021-05-09 ENCOUNTER — Encounter (HOSPITAL_COMMUNITY): Payer: Self-pay

## 2021-05-09 VITALS — BP 131/79 | HR 87 | Temp 97.7°F | Resp 17 | Ht 73.0 in | Wt 197.9 lb

## 2021-05-09 DIAGNOSIS — G4733 Obstructive sleep apnea (adult) (pediatric): Secondary | ICD-10-CM | POA: Insufficient documentation

## 2021-05-09 DIAGNOSIS — K219 Gastro-esophageal reflux disease without esophagitis: Secondary | ICD-10-CM | POA: Insufficient documentation

## 2021-05-09 DIAGNOSIS — Z79899 Other long term (current) drug therapy: Secondary | ICD-10-CM | POA: Diagnosis not present

## 2021-05-09 DIAGNOSIS — I4891 Unspecified atrial fibrillation: Secondary | ICD-10-CM | POA: Insufficient documentation

## 2021-05-09 DIAGNOSIS — Z01818 Encounter for other preprocedural examination: Secondary | ICD-10-CM

## 2021-05-09 DIAGNOSIS — Z8673 Personal history of transient ischemic attack (TIA), and cerebral infarction without residual deficits: Secondary | ICD-10-CM | POA: Diagnosis not present

## 2021-05-09 DIAGNOSIS — Z01812 Encounter for preprocedural laboratory examination: Secondary | ICD-10-CM | POA: Diagnosis present

## 2021-05-09 HISTORY — DX: Traumatic subdural hemorrhage with loss of consciousness status unknown, initial encounter: S06.5XAA

## 2021-05-09 LAB — CBC
HCT: 47.5 % (ref 39.0–52.0)
Hemoglobin: 15.6 g/dL (ref 13.0–17.0)
MCH: 29.8 pg (ref 26.0–34.0)
MCHC: 32.8 g/dL (ref 30.0–36.0)
MCV: 90.6 fL (ref 80.0–100.0)
Platelets: 191 10*3/uL (ref 150–400)
RBC: 5.24 MIL/uL (ref 4.22–5.81)
RDW: 13.3 % (ref 11.5–15.5)
WBC: 5.2 10*3/uL (ref 4.0–10.5)
nRBC: 0 % (ref 0.0–0.2)

## 2021-05-09 LAB — BASIC METABOLIC PANEL
Anion gap: 7 (ref 5–15)
BUN: 18 mg/dL (ref 8–23)
CO2: 24 mmol/L (ref 22–32)
Calcium: 9.2 mg/dL (ref 8.9–10.3)
Chloride: 111 mmol/L (ref 98–111)
Creatinine, Ser: 1.15 mg/dL (ref 0.61–1.24)
GFR, Estimated: >60 mL/min (ref >60)
Glucose, Bld: 132 mg/dL — ABNORMAL HIGH (ref 70–99)
Potassium: 4.4 mmol/L (ref 3.5–5.1)
Sodium: 142 mmol/L (ref 135–145)

## 2021-05-09 NOTE — Progress Notes (Signed)
PCP - Dr. Kathyrn Lass ?Cardiologist - Dr. Rayann Heman ? ?PPM/ICD - n/a ? ?Chest x-ray - n/a ?EKG - 03/21/21 ?Stress Test - 15+ years ago ?ECHO - 15+ years ago ?Cardiac Cath - 2000 ? ?Sleep Study - OSA+ ?CPAP - denies use ? ?Blood Thinner Instructions: Plavix, hold 5 days pre-op. LD 05/11/21 ?Aspirin Instructions: n/a ? ?ERAS Protcol -Clear liquids until 0530 DOS ?PRE-SURGERY Ensure or G2- none ordered ? ?COVID TEST- n/a ? ?Anesthesia review: Yes, cardiac clearance received 03/21/21 from Dr. Rayann Heman.  ? ?Patient denies shortness of breath, fever, cough and chest pain at PAT appointment ? ? ?All instructions explained to the patient, with a verbal understanding of the material. Patient agrees to go over the instructions while at home for a better understanding. Patient also instructed to self quarantine after being tested for COVID-19. The opportunity to ask questions was provided. ? ? ?

## 2021-05-10 ENCOUNTER — Encounter (HOSPITAL_COMMUNITY): Payer: Self-pay

## 2021-05-10 NOTE — Progress Notes (Signed)
Anesthesia Chart Review: ? Case: [id number] Date/Time: 05/16/21 0815  ? Procedure: OPEN RIGHT INGUINAL HERNIA REPAIR WITH MESH (Right)  ? Anesthesia type: General  ? Pre-op diagnosis: RIGHT INGUINAL HERNIA  ? Location: [facility] ROOM 02 / [facility]  ? Surgeons: Jovita Kussmaul, MD  ? ?  ? ? ?DISCUSSION: Patient is a 71 year old male scheduled for the above procedure. ? ?History includes never smoker, afib (s/p ablation by Dr. Adrian Prows [facility] 2009; declined [facility] and prefers Plavix given SDH history), PFO, TIA (~ 1998 & 2000), GERD, OSA (does not use CPAP), SDH (s/p right parietal burr hold with evacuation of subacute SDH 07/20/14) . ? ?Last cardiology evaluation by Dr. Rayann Heman on 03/21/2021. No known afib recurrence since ablation. Chads2vasc score is 3, with prior TIA, but noted patient declined [facility] and prefers Plavix. In regards to preoperative evaluation, he wrote, "He is scheduled to have hernia repair ?Ok to proceed without further CV testing." Follow-up in one year planned.  ?  ?Reported instructions to hold Plavix 5 days prior to surgery. Anesthesia team to evaluate on the day of surgery. ? ? ?VS: BP 131/79   Pulse 87   Temp 36.5 ?C (Oral)   Resp 17   Ht 6\' 1"  (1.854 m)   Wt 89.8 kg   SpO2 97%   BMI 26.11 kg/m?  ? ? ?PROVIDERS: ?Kathyrn Lass, MD is PCP  ?- Jyl Heinz, MD is primary cardiologist. (Previously had seen Ezzard Standing, MD.)  Last visit 03/05/18 with referral to EP for TIA with history of PAF and PFO.  Seen by EP, and he wanted to continue with conservative management. ?- Thompson Grayer, MD is EP cardiologist ? ? ?LABS: Labs reviewed: Acceptable for surgery. ?(all labs ordered are listed, but only abnormal results are displayed) ? ?Labs Reviewed  ?BASIC METABOLIC PANEL - Abnormal; Notable for the following components:  ?    Result Value  ? Glucose, Bld 132 (*)   ? All other components within normal limits  ?CBC  ? ? ?IMAGES: ?CT Abd/pelvis 03/19/21: ?IMPRESSION: ?- Small bilateral inguinal  hernias containing fat, similar to prior ?study. ?- No acute findings in the abdomen or pelvis. ?- Ancillary findings as above. [See full report] ?  ? ?EKG: 03/21/21: NSR ? ? ?CV: ?Per Dr. Thurman Coyer 02/16/17 note scanned under the Media tab, "cardiac cath (left) 1998, RF ablation for atrial fibrillation January 2009.Marland KitchenMarland KitchenTEE 2000, echocardiogram February 2008, event monitor December 2008...cerebral angiogram 2000; LVEF of 60% documented via echocardiogram on 03/09/2006". He added, " we discussed management of painful foramen but he is asymptomatic at his age and I will discuss this with the interventional cardiologist as the climate has change about whether to close it or not.  Since he is asymptomatic I would favor continued observation but will check." ? ? ?Past Medical History:  ?Diagnosis Date  ? Anxiety   ? Arthritis   ? Atrial fibrillation [facility])   ? s/p PVI at [facility] by Dr Ola Spurr in 2009  ? Concussion 12/2014  ? GERD (gastroesophageal reflux disease)   ? Headache   ? History of kidney stones   ? History of TIA (transient ischemic attack)   ? hx of 2  ? Migraine   ? hx of  ? PFO (patent foramen ovale)   ? RLS (restless legs syndrome)   ? SDH (subdural hematoma) (HCC)   ? s/p right parietal burr hole for subacute SDH evacuation 07/20/14  ? Sleep apnea   ? mild-no  cpap needed  ? TIA (transient ischemic attack)   ? ? ?Past Surgical History:  ?Procedure Laterality Date  ? BURR HOLE Right 07/20/2014  ? Procedure: Haskell Flirt For Evacuation of SDH;  Surgeon: Earnie Larsson, MD;  Location: [facility] NEURO ORS;  Service: Neurosurgery;  Laterality: Right;  ? CARDIAC CATHETERIZATION    ? 2000  ? CARDIAC ELECTROPHYSIOLOGY STUDY AND ABLATION  2009  ? PVI by Dr Ola Spurr at [facility]  ? CHEILECTOMY Left 02/03/2013  ? Procedure: LEFT HALLUX METATARSALPHALANGEAL JOINT CHEILECTOMY;  Surgeon: Wylene Simmer, MD;  Location: [place];  Service: Orthopedics;  Laterality: Left;  ? COLONOSCOPY    ? EXTRACORPOREAL SHOCK WAVE LITHOTRIPSY  Right 02/11/2016  ? Procedure: RIGHT EXTRACORPOREAL SHOCK WAVE LITHOTRIPSY (ESWL);  Surgeon: Irine Seal, MD;  Location: [facility] ORS;  Service: Urology;  Laterality: Right;  ? FOOT ARTHROTOMY  2008  ? right foot   ? MEDIAL PARTIAL KNEE REPLACEMENT Left 2021  ? Dr. Wynelle Link  ? SHOULDER SURGERY Left 2012  ? [facility] (Dr. Onnie Graham)  ? TONSILLECTOMY    ? ? ?MEDICATIONS: ? acetaminophen (TYLENOL) 500 MG tablet  ? atorvastatin (LIPITOR) 10 MG tablet  ? Cholecalciferol (DIALYVITE VITAMIN D 5000) 125 MCG (5000 UT) capsule  ? clonazePAM (KLONOPIN) 1 MG tablet  ? clopidogrel (PLAVIX) 75 MG tablet  ? Ferrous Sulfate (IRON) 325 (65 FE) MG TABS  ? fluticasone (FLONASE) 50 MCG/ACT nasal spray  ? gabapentin (NEURONTIN) 300 MG capsule  ? latanoprost (XALATAN) 0.005 % ophthalmic solution  ? loratadine (CLARITIN) 10 MG tablet  ? MAGNESIUM PO  ? Multiple Vitamin (MULTIVITAMIN) tablet  ? Multiple Vitamins-Minerals (ZINC PO)  ? ?No current facility-administered medications for this encounter.  ? ? ?Myra Gianotti, PA-C ?Surgical Short Stay/Anesthesiology ?[facility] Phone [phone] ?[facility] Phone [phone] ?05/10/2021 3:29 PM ? ? ? ? ? ?

## 2021-05-10 NOTE — Anesthesia Preprocedure Evaluation (Addendum)
Anesthesia Evaluation  ?Patient identified by MRN, date of birth, ID band ?Patient awake ? ? ? ?Reviewed: ?Allergy & Precautions, NPO status , Patient's Chart, lab work & pertinent test results ? ?History of Anesthesia Complications ?Negative for: history of anesthetic complications ? ?Airway ?Mallampati: II ? ?TM Distance: >3 FB ?Neck ROM: Full ? ? ? Dental ? ?(+) Teeth Intact, Dental Advisory Given ?  ?Pulmonary ?sleep apnea ,  ?  ?Pulmonary exam normal ? ? ? ? ? ? ? Cardiovascular ?Normal cardiovascular exam+ dysrhythmias (s/p ablation 2009) Atrial Fibrillation  ? ? ?  ?Neuro/Psych ?H/o SDH in 2016 ?TIA  ? GI/Hepatic ?Neg liver ROS, GERD  ,  ?Endo/Other  ?negative endocrine ROS ? Renal/GU ?negative Renal ROS  ?negative genitourinary ?  ?Musculoskeletal ?negative musculoskeletal ROS ?(+)  ? Abdominal ?  ?Peds ? Hematology ?negative hematology ROS ?(+)   ?Anesthesia Other Findings ? ? Reproductive/Obstetrics ? ?  ? ? ? ? ? ? ? ? ? ? ? ? ? ?  ?  ? ? ? ? ? ? ? ?Anesthesia Physical ?Anesthesia Plan ? ?ASA: 3 ? ?Anesthesia Plan: General  ? ?Post-op Pain Management: Regional block* and Tylenol PO (pre-op)*  ? ?Induction: Intravenous ? ?PONV Risk Score and Plan: 2 and Ondansetron, Dexamethasone, Midazolam and Treatment may vary due to age or medical condition ? ?Airway Management Planned: LMA ? ?Additional Equipment: None ? ?Intra-op Plan:  ? ?Post-operative Plan: Extubation in OR ? ?Informed Consent: I have reviewed the patients History and Physical, chart, labs and discussed the procedure including the risks, benefits and alternatives for the proposed anesthesia with the patient or authorized representative who has indicated his/her understanding and acceptance.  ? ? ? ?Dental advisory given ? ?Plan Discussed with:  ? ?Anesthesia Plan Comments: (PAT note written 05/10/2021 by Myra Gianotti, PA-C. ?)  ? ? ? ? ?Anesthesia Quick Evaluation ? ?

## 2021-05-16 ENCOUNTER — Ambulatory Visit (HOSPITAL_COMMUNITY)
Admission: RE | Admit: 2021-05-16 | Discharge: 2021-05-16 | Disposition: A | Discharge status: Home / Self Care | Payer: Medicare Other | Source: Ambulatory Visit | Attending: General Surgery | Admitting: General Surgery

## 2021-05-16 ENCOUNTER — Ambulatory Visit (HOSPITAL_COMMUNITY): Payer: Medicare Other | Admitting: Vascular Surgery

## 2021-05-16 ENCOUNTER — Encounter (HOSPITAL_COMMUNITY): Payer: Self-pay | Admitting: General Surgery

## 2021-05-16 ENCOUNTER — Other Ambulatory Visit: Payer: Self-pay

## 2021-05-16 ENCOUNTER — Encounter (HOSPITAL_COMMUNITY)
Admission: RE | Disposition: A | Discharge status: Home / Self Care | Payer: Self-pay | Source: Ambulatory Visit | Attending: General Surgery

## 2021-05-16 ENCOUNTER — Ambulatory Visit (HOSPITAL_BASED_OUTPATIENT_CLINIC_OR_DEPARTMENT_OTHER): Payer: Medicare Other | Admitting: Anesthesiology

## 2021-05-16 DIAGNOSIS — G4730 Sleep apnea, unspecified: Secondary | ICD-10-CM

## 2021-05-16 DIAGNOSIS — Z7901 Long term (current) use of anticoagulants: Secondary | ICD-10-CM | POA: Insufficient documentation

## 2021-05-16 DIAGNOSIS — K4090 Unilateral inguinal hernia, without obstruction or gangrene, not specified as recurrent: Secondary | ICD-10-CM | POA: Insufficient documentation

## 2021-05-16 DIAGNOSIS — Z8679 Personal history of other diseases of the circulatory system: Secondary | ICD-10-CM

## 2021-05-16 DIAGNOSIS — Z8673 Personal history of transient ischemic attack (TIA), and cerebral infarction without residual deficits: Secondary | ICD-10-CM | POA: Diagnosis not present

## 2021-05-16 DIAGNOSIS — I4891 Unspecified atrial fibrillation: Secondary | ICD-10-CM | POA: Diagnosis not present

## 2021-05-16 HISTORY — PX: INGUINAL HERNIA REPAIR: SHX194

## 2021-05-16 HISTORY — PX: INSERTION OF MESH: SHX5868

## 2021-05-16 SURGERY — REPAIR, HERNIA, INGUINAL, ADULT
Anesthesia: General | Site: Inguinal | Laterality: Right

## 2021-05-16 MED ORDER — CELECOXIB 200 MG PO CAPS
200.0000 mg | ORAL_CAPSULE | ORAL | Status: AC
  Administered 2021-05-16: 200 mg via ORAL
  Filled 2021-05-16: qty 1

## 2021-05-16 MED ORDER — OXYCODONE HCL 5 MG PO TABS
5.0000 mg | ORAL_TABLET | Freq: Once | ORAL | Status: AC | PRN
  Administered 2021-05-16: 5 mg via ORAL

## 2021-05-16 MED ORDER — OXYCODONE HCL 5 MG/5ML PO SOLN: 5.0000 mg | Freq: Once | ORAL | Status: AC | PRN

## 2021-05-16 MED ORDER — ROPIVACAINE HCL 5 MG/ML IJ SOLN
INTRAMUSCULAR | Status: DC | PRN
  Administered 2021-05-16: 30 mL via PERINEURAL

## 2021-05-16 MED ORDER — PROPOFOL 10 MG/ML IV BOLUS
INTRAVENOUS | Status: DC | PRN
  Administered 2021-05-16: 140 mg via INTRAVENOUS

## 2021-05-16 MED ORDER — FENTANYL CITRATE (PF) 100 MCG/2ML IJ SOLN
INTRAMUSCULAR | Status: AC
  Filled 2021-05-16: qty 2

## 2021-05-16 MED ORDER — AMISULPRIDE (ANTIEMETIC) 5 MG/2ML IV SOLN: 10.0000 mg | Freq: Once | INTRAVENOUS | Status: DC | PRN

## 2021-05-16 MED ORDER — CHLORHEXIDINE GLUCONATE 0.12 % MT SOLN
15.0000 mL | Freq: Once | OROMUCOSAL | Status: AC
  Administered 2021-05-16: 15 mL via OROMUCOSAL
  Filled 2021-05-16: qty 15

## 2021-05-16 MED ORDER — FENTANYL CITRATE (PF) 250 MCG/5ML IJ SOLN
INTRAMUSCULAR | Status: DC | PRN
  Administered 2021-05-16 (×3): 50 ug via INTRAVENOUS

## 2021-05-16 MED ORDER — GABAPENTIN 300 MG PO CAPS
300.0000 mg | ORAL_CAPSULE | ORAL | Status: AC
  Administered 2021-05-16: 300 mg via ORAL
  Filled 2021-05-16: qty 1

## 2021-05-16 MED ORDER — FENTANYL CITRATE (PF) 250 MCG/5ML IJ SOLN
INTRAMUSCULAR | Status: AC
  Filled 2021-05-16: qty 5

## 2021-05-16 MED ORDER — CHLORHEXIDINE GLUCONATE CLOTH 2 % EX PADS: 6.0000 | MEDICATED_PAD | Freq: Once | CUTANEOUS | Status: DC

## 2021-05-16 MED ORDER — ONDANSETRON HCL 4 MG/2ML IJ SOLN: 4.0000 mg | Freq: Once | INTRAMUSCULAR | Status: DC | PRN

## 2021-05-16 MED ORDER — PROPOFOL 10 MG/ML IV BOLUS
INTRAVENOUS | Status: AC
  Filled 2021-05-16: qty 20

## 2021-05-16 MED ORDER — CEFAZOLIN SODIUM-DEXTROSE 2-4 GM/100ML-% IV SOLN
2.0000 g | INTRAVENOUS | Status: AC
  Administered 2021-05-16: 2 g via INTRAVENOUS
  Filled 2021-05-16: qty 100

## 2021-05-16 MED ORDER — DEXAMETHASONE SODIUM PHOSPHATE 10 MG/ML IJ SOLN
INTRAMUSCULAR | Status: DC | PRN
Start: 2021-05-16 — End: 2021-05-16
  Administered 2021-05-16: 4 mg via INTRAVENOUS

## 2021-05-16 MED ORDER — LIDOCAINE 2% (20 MG/ML) 5 ML SYRINGE
INTRAMUSCULAR | Status: DC | PRN
  Administered 2021-05-16: 80 mg via INTRAVENOUS

## 2021-05-16 MED ORDER — MIDAZOLAM HCL 2 MG/2ML IJ SOLN
INTRAMUSCULAR | Status: DC | PRN
  Administered 2021-05-16 (×2): 1 mg via INTRAVENOUS

## 2021-05-16 MED ORDER — ACETAMINOPHEN 500 MG PO TABS
1000.0000 mg | ORAL_TABLET | Freq: Once | ORAL | Status: AC
  Administered 2021-05-16: 1000 mg via ORAL

## 2021-05-16 MED ORDER — ACETAMINOPHEN 500 MG PO TABS
1000.0000 mg | ORAL_TABLET | ORAL | Status: AC
  Filled 2021-05-16: qty 2

## 2021-05-16 MED ORDER — EPHEDRINE SULFATE-NACL 50-0.9 MG/10ML-% IV SOSY
PREFILLED_SYRINGE | INTRAVENOUS | Status: DC | PRN
  Administered 2021-05-16 (×2): 5 mg via INTRAVENOUS
  Administered 2021-05-16: 10 mg via INTRAVENOUS

## 2021-05-16 MED ORDER — OXYCODONE HCL 5 MG PO TABS: 5.0000 mg | ORAL_TABLET | Freq: Four times a day (QID) | ORAL | 0 refills | Status: DC | PRN

## 2021-05-16 MED ORDER — 0.9 % SODIUM CHLORIDE (POUR BTL) OPTIME
TOPICAL | Status: DC | PRN
  Administered 2021-05-16: 1000 mL

## 2021-05-16 MED ORDER — BUPIVACAINE-EPINEPHRINE (PF) 0.25% -1:200000 IJ SOLN
INTRAMUSCULAR | Status: AC
  Filled 2021-05-16: qty 30

## 2021-05-16 MED ORDER — BUPIVACAINE-EPINEPHRINE 0.25% -1:200000 IJ SOLN
INTRAMUSCULAR | Status: DC | PRN
  Administered 2021-05-16: 10 mL

## 2021-05-16 MED ORDER — OXYCODONE HCL 5 MG PO TABS
ORAL_TABLET | ORAL | Status: AC
  Filled 2021-05-16: qty 1

## 2021-05-16 MED ORDER — ONDANSETRON HCL 4 MG/2ML IJ SOLN
INTRAMUSCULAR | Status: DC | PRN
  Administered 2021-05-16: 4 mg via INTRAVENOUS

## 2021-05-16 MED ORDER — MIDAZOLAM HCL 2 MG/2ML IJ SOLN
INTRAMUSCULAR | Status: AC
  Filled 2021-05-16: qty 2

## 2021-05-16 MED ORDER — ORAL CARE MOUTH RINSE: 15.0000 mL | Freq: Once | OROMUCOSAL | Status: AC

## 2021-05-16 MED ORDER — FENTANYL CITRATE (PF) 100 MCG/2ML IJ SOLN
25.0000 ug | INTRAMUSCULAR | Status: DC | PRN
  Administered 2021-05-16: 50 ug via INTRAVENOUS

## 2021-05-16 MED ORDER — LACTATED RINGERS IV SOLN: INTRAVENOUS | Status: DC

## 2021-05-16 SURGICAL SUPPLY — 40 items
ADH SKN CLS APL DERMABOND .7 (GAUZE/BANDAGES/DRESSINGS) ×1
APL PRP STRL LF DISP 70% ISPRP (MISCELLANEOUS) ×1
BAG COUNTER SPONGE SURGICOUNT (BAG) ×3 IMPLANT
BAG SPNG CNTER NS LX DISP (BAG) ×1
BLADE CLIPPER SURG (BLADE) IMPLANT
CHLORAPREP W/TINT 26 (MISCELLANEOUS) ×3 IMPLANT
COVER SURGICAL LIGHT HANDLE (MISCELLANEOUS) ×3 IMPLANT
DECANTER SPIKE VIAL GLASS SM (MISCELLANEOUS) ×2 IMPLANT
DERMABOND ADVANCED (GAUZE/BANDAGES/DRESSINGS) ×1
DERMABOND ADVANCED .7 DNX12 (GAUZE/BANDAGES/DRESSINGS) ×2 IMPLANT
DRAIN PENROSE 1/2X12 LTX STRL (WOUND CARE) ×1 IMPLANT
DRAPE LAPAROSCOPIC ABDOMINAL (DRAPES) ×3 IMPLANT
ELECT REM PT RETURN 9FT ADLT (ELECTROSURGICAL) ×2
ELECTRODE REM PT RTRN 9FT ADLT (ELECTROSURGICAL) ×2 IMPLANT
GLOVE BIO SURGEON STRL SZ7.5 (GLOVE) ×3 IMPLANT
GOWN STRL REUS W/ TWL LRG LVL3 (GOWN DISPOSABLE) ×4 IMPLANT
GOWN STRL REUS W/TWL LRG LVL3 (GOWN DISPOSABLE) ×4
KIT BASIN OR (CUSTOM PROCEDURE TRAY) ×3 IMPLANT
KIT TURNOVER KIT B (KITS) ×3 IMPLANT
MESH ULTRAPRO 3X6 7.6X15CM (Mesh General) ×1 IMPLANT
NDL HYPO 25GX1X1/2 BEV (NEEDLE) ×2 IMPLANT
NEEDLE HYPO 25GX1X1/2 BEV (NEEDLE) ×2 IMPLANT
NS IRRIG 1000ML POUR BTL (IV SOLUTION) ×3 IMPLANT
PACK GENERAL/GYN (CUSTOM PROCEDURE TRAY) ×3 IMPLANT
PAD ARMBOARD 7.5X6 YLW CONV (MISCELLANEOUS) ×6 IMPLANT
PENCIL SMOKE EVACUATOR (MISCELLANEOUS) ×2 IMPLANT
SUT MNCRL AB 4-0 PS2 18 (SUTURE) ×3 IMPLANT
SUT PROLENE 2 0 SH DA (SUTURE) ×5 IMPLANT
SUT SILK 2 0 SH (SUTURE) IMPLANT
SUT SILK 3 0 (SUTURE) ×2
SUT SILK 3-0 18XBRD TIE 12 (SUTURE) ×2 IMPLANT
SUT VIC AB 0 CT1 27 (SUTURE) ×4
SUT VIC AB 0 CT1 27XBRD ANBCTR (SUTURE) ×2 IMPLANT
SUT VIC AB 2-0 SH 27 (SUTURE) ×2
SUT VIC AB 2-0 SH 27X BRD (SUTURE) ×2 IMPLANT
SUT VIC AB 3-0 SH 27 (SUTURE) ×2
SUT VIC AB 3-0 SH 27XBRD (SUTURE) ×2 IMPLANT
SYR CONTROL 10ML LL (SYRINGE) ×3 IMPLANT
TOWEL GREEN STERILE (TOWEL DISPOSABLE) ×3 IMPLANT
TOWEL GREEN STERILE FF (TOWEL DISPOSABLE) ×3 IMPLANT

## 2021-05-16 NOTE — Interval H&P Note (Signed)
History and Physical Interval Note: ? ?05/16/2021 ?8:13 AM ? ?Gregg Guerrero  has presented today for surgery, with the diagnosis of RIGHT INGUINAL HERNIA.  The various methods of treatment have been discussed with the patient and family. After consideration of risks, benefits and other options for treatment, the patient has consented to  Procedure(s): ?OPEN RIGHT INGUINAL HERNIA REPAIR WITH MESH (Right) as a surgical intervention.  The patient's history has been reviewed, patient examined, no change in status, stable for surgery.  I have reviewed the patient's chart and labs.  Questions were answered to the patient's satisfaction.   ? ? ?[name] ? ? ?

## 2021-05-16 NOTE — Op Note (Signed)
05/16/2021 ? ?10:20 AM ? ?PATIENT:  Gregg Guerrero  71 y.o. male ? ?PRE-OPERATIVE DIAGNOSIS:  RIGHT INGUINAL HERNIA ? ?POST-OPERATIVE DIAGNOSIS:  RIGHT INGUINAL HERNIA ? ?PROCEDURE:  Procedure(s): ?OPEN RIGHT INGUINAL HERNIA REPAIR (Right) ?INSERTION OF MESH (Right) ? ?SURGEON:  Surgeon(s) and Role: ?   Jovita Kussmaul, MD - Primary ? ?PHYSICIAN ASSISTANT:  ? ?ASSISTANTS: none  ? ?ANESTHESIA:   local and general ? ?EBL:  minimal  ? ?BLOOD ADMINISTERED:none ? ?DRAINS: none  ? ?LOCAL MEDICATIONS USED:  MARCAINE    ? ?SPECIMEN:  No Specimen ? ?DISPOSITION OF SPECIMEN:  N/A ? ?COUNTS:  YES ? ?TOURNIQUET:  * No tourniquets in log * ? ?DICTATION: .Dragon Dictation ? ?After informed consent was obtained the patient was brought to the operating room and placed in the supine position on the operating table.  After adequate induction of general anesthesia the patient's abdomen and right groin were prepped with ChloraPrep, allowed to dry, and draped in usual sterile manner.  An appropriate timeout was performed.  The right groin was then infiltrated with quarter percent Marcaine.  A small transversely oriented incision was made from the edges of the pubic tubercle on the right towards the anterior superior iliac spine.  The incision was carried through the skin and subcutaneous tissue sharply with the electrocautery until the fascia of the external oblique was encountered.  A small bridging vein was clamped with hemostats, divided, and ligated with 3-0 silk ties.  The external oblique fascia was then opened along its fibers towards the apex of the external ring with a 15 blade knife and Metzenbaum scissors.  A [place] retractor was deployed.  Blunt dissection was carried out of the cord structures until they could be surrounded between 2 fingers.  1/2 inch Penrose drain was placed around the cord structures for retraction purposes.  The cord was then gently skeletonized by blunt hemostat dissection.  I was able to  identify a small sac and a sizable lipoma associated with it.  This was able to be reduced back beneath the transversalis muscle without difficulty.  The floor of the canal was then repaired with interrupted 0 Vicryl stitches.  A 3 x 6 piece of ultra Pro mesh was then chosen and cut to the appropriate size.  During the dissection the ilioinguinal nerve was identified and involved with some scar tissue.  It was dissected out proximally distally, clamped, divided, and ligated with 3-0 silk ties.  Care was taken to avoid any injury to the vas deferens or testicular vessels.  The mesh was then sewed inferiorly to the shelving edge of the inguinal ligament with a running 2-0 Prolene stitch.  Tails were cut in the mesh laterally and the tails were wrapped around the cord structures.  Medially and superiorly the mesh was sewed to the muscular aponeurotic strength layer of the transversalis with interrupted 2-0 Prolene vertical mattress stitches.  Lateral to the cord the tails of the mesh were anchored to the shelving edge of the inguinal ligament with interrupted 2-0 Prolene stitches.  Once this was accomplished the mesh appeared to be in good position the hernia seem well repaired.  The wound was irrigated with copious amounts of saline.  The external oblique fascia was then reapproximated with a running 2-0 Vicryl stitch.  The wound was infiltrated with more quarter percent Marcaine.  The subcutaneous fascia was then closed with a running 3-0 Vicryl stitch.  The skin was closed with a running 4-0 Monocryl subcuticular  stitch.  Dermabond dressings were applied.  The patient tolerated the procedure well.  At the end of the case all needle sponge and instrument counts were correct.  The patient was then awakened and taken to recovery in stable condition.  The patient's testicle was in the scrotum at the end of the case. ? ?PLAN OF CARE: Discharge to home after PACU ? ?PATIENT DISPOSITION:  PACU - hemodynamically stable. ?   ?Delay start of Pharmacological VTE agent (>24hrs) due to surgical blood loss or risk of bleeding: not applicable ? ?

## 2021-05-16 NOTE — Transfer of Care (Signed)
Immediate Anesthesia Transfer of Care Note ? ?Patient: Gregg Guerrero ? ?Procedure(s) Performed: OPEN RIGHT INGUINAL HERNIA REPAIR (Right: Inguinal) ?INSERTION OF MESH (Right: Inguinal) ? ?Patient Location: PACU ? ?Anesthesia Type:General ? ?Level of Consciousness: drowsy ? ?Airway & Oxygen Therapy: Patient Spontanous Breathing, Patient connected to nasal cannula oxygen and oral airway ? ?Post-op Assessment: Report given to RN and Post -op Vital signs reviewed and stable ? ?Post vital signs: Reviewed and stable ? ?Last Vitals:  ?Vitals Value Taken Time  ?BP 136/80 05/16/21 1023  ?Temp    ?Pulse 87 05/16/21 1029  ?Resp 10 05/16/21 1029  ?SpO2 95 % 05/16/21 1029  ?Vitals shown include unvalidated device data. ? ?Last Pain:  ?Vitals:  ? 05/16/21 0750  ?TempSrc:   ?PainSc: 0-No pain  ?   ? ?  ? ?Complications: No notable events documented. ?

## 2021-05-16 NOTE — Anesthesia Procedure Notes (Signed)
Anesthesia Regional Block: TAP block  ? ?Pre-Anesthetic Checklist: , timeout performed,  Correct Patient, Correct Site, Correct Laterality,  Correct Procedure, Correct Position, site marked,  Risks and benefits discussed,  Surgical consent,  Pre-op evaluation,  At surgeon's request and post-op pain management ? ?Laterality: Right ? ?Prep: chloraprep     ?  ?Needles:  ?Injection technique: Single-shot ? ?Needle Type: Echogenic Stimulator Needle   ? ? ?Needle Length: 10cm  ?Needle Gauge: 20  ? ? ? ?Additional Needles: ? ? ?Procedures:,,,, ultrasound used (permanent image in chart),,    ?Narrative:  ?Start time: 05/16/2021 7:18 AM ?End time: 05/16/2021 7:22 AM ?Injection made incrementally with aspirations every 5 mL. ? ?Performed by: Personally  ?Anesthesiologist: Lucretia Kern, MD ? ?Additional Notes: ?Standard monitors applied. Skin prepped. Good needle visualization with ultrasound. Injection made in 5cc increments with no resistance to injection. Patient tolerated the procedure well. ? ? ? ? ? ?

## 2021-05-16 NOTE — Anesthesia Postprocedure Evaluation (Signed)
Anesthesia Post Note ? ?Patient: Gregg Guerrero ? ?Procedure(s) Performed: OPEN RIGHT INGUINAL HERNIA REPAIR (Right: Inguinal) ?INSERTION OF MESH (Right: Inguinal) ? ?  ? ?Patient location during evaluation: PACU ?Anesthesia Type: General ?Level of consciousness: awake and alert ?Pain management: pain level controlled ?Vital Signs Assessment: post-procedure vital signs reviewed and stable ?Respiratory status: spontaneous breathing, nonlabored ventilation and respiratory function stable ?Cardiovascular status: blood pressure returned to baseline and stable ?Postop Assessment: no apparent nausea or vomiting ?Anesthetic complications: no ? ? ?No notable events documented. ? ?Last Vitals:  ?Vitals:  ? 05/16/21 1040 05/16/21 1110  ?BP: 134/83 139/83  ?Pulse: 87 85  ?Resp: 12 19  ?Temp:  36.4 ?C  ?SpO2: 94% 93%  ?  ?Last Pain:  ?Vitals:  ? 05/16/21 1110  ?TempSrc:   ?PainSc: 4   ? ? ?  ?  ?  ?  ?  ?  ? ?Lucretia Kern ? ? ? ? ?

## 2021-05-16 NOTE — H&P (Signed)
?REFERRING PHYSICIAN: Elgie Congo, * ? ?PROVIDER: Landry Corporal, MD ? ?MRN: [id number] ?DOB: Oct 12, 1950 ?Subjective  ? ?Chief Complaint: Hernia ? ? ?History of Present Illness: ?Gregg Guerrero is a 71 y.o. male who is seen today as an office consultation at the request of Dr. Ranae Pila for evaluation of Hernia ?.  ? ?We are asked to see the patient in consultation by Dr. Dewitt Hoes to evaluate him for a right inguinal hernia. The patient is a 71 year old white male who has been experiencing some burning discomfort in his right groin for the last 2 years. During that time he has noticed a slight swelling in the right groin compared to the left. He mostly feels the burning after a long day of standing. He denies any nausea or vomiting. He denies any fevers or chills. He does not smoke. He does take Plavix for a history of a TIA but has been off this several times in the past for surgery without problems. The patient also reports some occasional lower abdominal tenderness ? ?Review of Systems: ?A complete review of systems was obtained from the patient. I have reviewed this information and discussed as appropriate with the patient. See HPI as well for other ROS. ? ?ROS  ? ?Medical History: ?Past Medical History:  ?Diagnosis Date  ? Arthritis  ? History of stroke  ? ?Patient Active Problem List  ?Diagnosis  ? Non-recurrent unilateral inguinal hernia without obstruction or gangrene  ? ?Past Surgical History:  ?Procedure Laterality Date  ? partial knee replacement Left  ? ? ?No Known Allergies ? ?Current Outpatient Medications on File Prior to Visit  ?Medication Sig Dispense Refill  ? clonazePAM (KLONOPIN) 1 MG tablet 1 tablet  ? atorvastatin (LIPITOR) 10 MG tablet Take 1 tablet by mouth once daily  ? cetirizine (ZYRTEC) 10 MG tablet Take by mouth  ? cholecalciferol (VITAMIN D3) 2,000 unit capsule Take by mouth  ? clopidogreL (PLAVIX) 75 mg tablet Take 1 tablet by mouth once daily  ? ferrous sulfate 325 (65 FE)  MG tablet Take by mouth  ? gabapentin (NEURONTIN) 300 MG capsule  ? latanoprost (XALATAN) 0.005 % ophthalmic solution Place 1 drop into both eyes at bedtime  ? MAGNESIUM CITRATE ORAL 1 capsule 150 mg  ? ?No current facility-administered medications on file prior to visit.  ? ?Family History  ?Problem Relation Age of Onset  ? Stroke Mother  ? Coronary Artery Disease (Blocked arteries around heart) Father  ? ? ?Social History  ? ?Tobacco Use  ?Smoking Status Never  ?Smokeless Tobacco Never  ? ? ?Social History  ? ?Socioeconomic History  ? Marital status: Married  ?Tobacco Use  ? Smoking status: Never  ? Smokeless tobacco: Never  ? ?Objective:  ? ?Vitals:  ?BP: 126/82  ?Pulse: 85  ?Weight: 90.4 kg (199 lb 3.2 oz)  ?Height: 185.4 cm (6\' 1" )  ? ?Body mass index is 26.28 kg/m?. ? ?Physical Exam ?Constitutional:  ?General: He is not in acute distress. ?Appearance: Normal appearance.  ?HENT:  ?Head: Normocephalic and atraumatic.  ?Right Ear: External ear normal.  ?Left Ear: External ear normal.  ?Nose: Nose normal.  ?Mouth/Throat:  ?Mouth: Mucous membranes are moist.  ?Pharynx: Oropharynx is clear.  ?Eyes:  ?General: No scleral icterus. ?Extraocular Movements: Extraocular movements intact.  ?Conjunctiva/sclera: Conjunctivae normal.  ?Pupils: Pupils are equal, round, and reactive to light.  ?Cardiovascular:  ?Rate and Rhythm: Normal rate and regular rhythm.  ?Pulses: Normal pulses.  ?Heart sounds: Normal heart sounds.  ?Pulmonary:  ?  Effort: Pulmonary effort is normal. No respiratory distress.  ?Breath sounds: Normal breath sounds.  ?Abdominal:  ?General: Abdomen is flat. Bowel sounds are normal. There is no distension.  ?Palpations: Abdomen is soft.  ?Tenderness: There is no abdominal tenderness.  ?Genitourinary: ?Comments: There is a small to medium sized bulge in the right groin that seems to reduce easily. There is no palpable bulge or impulse with straining on the left groin ?Musculoskeletal:  ?General: No swelling or  deformity. Normal range of motion.  ?Cervical back: Normal range of motion and neck supple. No tenderness.  ?Skin: ?General: Skin is warm and dry.  ?Coloration: Skin is not jaundiced.  ?Neurological:  ?General: No focal deficit present.  ?Mental Status: He is alert and oriented to person, place, and time.  ?Psychiatric:  ?Mood and Affect: Mood normal.  ?Behavior: Behavior normal.  ? ? ? ?Labs, Imaging and Diagnostic Testing: ? ?Assessment and Plan:  ? ?Diagnoses and all orders for this visit: ? ?Non-recurrent unilateral inguinal hernia without obstruction or gangrene ? ? ? ?The patient appears to have a symptomatic right inguinal hernia. Because of the risk of incarceration and strangulation I feel he would benefit from having this fixed. He would also like to have this done. I have discussed with him in detail the risks and benefits of the operation as well as some of the technical aspects including the use of mesh and the risk of chronic pain and he understands and wishes to proceed. He is also concerned about the intermittent lower abdominal pain so we will evaluate this with a CT scan and call him with the results. We will also obtain cardiac clearance from both his medical doctor and his cardiologist so that he can be off Plavix for the surgery.  ?

## 2021-05-17 ENCOUNTER — Encounter (HOSPITAL_COMMUNITY): Payer: Self-pay | Admitting: General Surgery

## 2021-07-29 ENCOUNTER — Emergency Department (HOSPITAL_BASED_OUTPATIENT_CLINIC_OR_DEPARTMENT_OTHER): Payer: Medicare Other

## 2021-07-29 ENCOUNTER — Emergency Department (HOSPITAL_BASED_OUTPATIENT_CLINIC_OR_DEPARTMENT_OTHER)
Admission: EM | Admit: 2021-07-29 | Discharge: 2021-07-29 | Disposition: A | Discharge status: Home / Self Care | Payer: Medicare Other | Attending: Emergency Medicine | Admitting: Emergency Medicine

## 2021-07-29 ENCOUNTER — Encounter (HOSPITAL_BASED_OUTPATIENT_CLINIC_OR_DEPARTMENT_OTHER): Payer: Self-pay

## 2021-07-29 ENCOUNTER — Emergency Department (HOSPITAL_BASED_OUTPATIENT_CLINIC_OR_DEPARTMENT_OTHER): Payer: Medicare Other | Admitting: Radiology

## 2021-07-29 ENCOUNTER — Other Ambulatory Visit: Payer: Self-pay

## 2021-07-29 DIAGNOSIS — S0990XA Unspecified injury of head, initial encounter: Secondary | ICD-10-CM | POA: Insufficient documentation

## 2021-07-29 DIAGNOSIS — S299XXA Unspecified injury of thorax, initial encounter: Secondary | ICD-10-CM | POA: Diagnosis present

## 2021-07-29 DIAGNOSIS — W06XXXA Fall from bed, initial encounter: Secondary | ICD-10-CM | POA: Diagnosis not present

## 2021-07-29 DIAGNOSIS — S2232XA Fracture of one rib, left side, initial encounter for closed fracture: Secondary | ICD-10-CM | POA: Insufficient documentation

## 2021-07-29 DIAGNOSIS — Z7901 Long term (current) use of anticoagulants: Secondary | ICD-10-CM | POA: Diagnosis not present

## 2021-07-29 NOTE — Discharge Instructions (Addendum)
You have a singular rib fracture to the left side. Please take tylenol for pain control every 6 hours. Take ibuprofen after a week as needed. Ensure you are taking deep breaths every few minutes to prevent infection.  See your primary care doctor as needed.  Avoid any heavy lifting for the next 2 weeks. Increased activity should be gradual, and ensure you listen to your body if you have pain.

## 2021-07-29 NOTE — ED Triage Notes (Signed)
Patient here POV from Home.  Endorses Rolling OOB accidentally today at 0800. Endorses hitting his Superior Left Orbit and endorses Pain to Left Lower Rib Cage that radiates upwards.   No LOC. No Obvious Deformities noted.  Was at Clinic today and was told to [facility] ED Evaluation as Patient takes Plavix.   NAD Noted during Triage. A&Ox4. GCS 15. Ambulatory.

## 2021-07-29 NOTE — ED Provider Notes (Signed)
[facility] EMERGENCY DEPT Provider Note   CSN: [id number] Arrival date & time: 07/29/21  1154     History  Chief Complaint  Patient presents with   Gregg Guerrero is a 71 y.o. male.  HPI    Pt comes in with chief complaint of fall.  Patient is on Plavix and indicates that he rolled over from his bed and fell on his left side.  He might have struck his head gently over night stand.  Patient denies loss of consciousness or severe headache.  He went to the urgent care, but was advised to come to the ER because of his head injury.  He is having pain over the left lower chest.  He did take some Aleve prior to arriving and it has helped.  He denies any severe shortness of breath or severe chest pain.  Patient denies any abdominal pain back pain, nausea, vomiting, severe headache, severe neck pain.   Home Medications Prior to Admission medications   Medication Sig Start Date End Date Taking? Authorizing Provider  acetaminophen (TYLENOL) 500 MG tablet Take 1,500 mg by mouth every 4 (four) hours as needed for moderate pain.    [provider]  atorvastatin (LIPITOR) 10 MG tablet Take 10 mg by mouth daily. 11/17/17   [provider]  Cholecalciferol (DIALYVITE VITAMIN D 5000) 125 MCG (5000 UT) capsule Take 1,500 Units by mouth every Saturday.    [provider]  clonazePAM (KLONOPIN) 1 MG tablet Take 1 mg by mouth at bedtime.    [provider]  clopidogrel (PLAVIX) 75 MG tablet Take 75 mg by mouth daily.    [provider]  Ferrous Sulfate (IRON) 325 (65 FE) MG TABS Take 325 mg by mouth daily.     [provider]  fluticasone (FLONASE) 50 MCG/ACT nasal spray Place 1 spray into both nostrils daily as needed for allergies.    [provider]  gabapentin (NEURONTIN) 300 MG capsule Take 300 mg by mouth at bedtime. 01/19/20   [provider]  latanoprost (XALATAN) 0.005 % ophthalmic solution Place 1  drop into both eyes daily. 09/17/18   [provider]  loratadine (CLARITIN) 10 MG tablet Take 10 mg by mouth daily.    [provider]  MAGNESIUM PO Take 1 tablet by mouth daily.    [provider]  Multiple Vitamin (MULTIVITAMIN) tablet Take 1 tablet by mouth daily.    [provider]  Multiple Vitamins-Minerals (ZINC PO) Take 1 tablet by mouth daily.    [provider]  oxyCODONE (ROXICODONE) 5 MG immediate release tablet Take 1 tablet (5 mg total) by mouth every 6 (six) hours as needed for severe pain. 05/16/21   Griselda Miner, MD      Allergies    Patient has no known allergies.    Review of Systems   Review of Systems  All other systems reviewed and are negative.   Physical Exam Updated Vital Signs BP (!) 139/93   Pulse 80   Temp 98 F (36.7 C)   Resp 12   Ht 6\' 1"  (1.854 m)   Wt 89.8 kg   SpO2 93%   BMI 26.12 kg/m  Physical Exam Vitals and nursing note reviewed.  Constitutional:      Appearance: He is well-developed.  HENT:     Head: Atraumatic.  Cardiovascular:     Rate and Rhythm: Normal rate.     Comments: Patient has tenderness  over the left anterolateral chest wall inferiorly No bruising Pulmonary:     Effort: Pulmonary effort is normal.  Abdominal:     Tenderness: There is no abdominal tenderness.  Musculoskeletal:        General: No swelling, tenderness, deformity or signs of injury.     Cervical back: Neck supple.  Skin:    General: Skin is warm.  Neurological:     Mental Status: He is alert and oriented to person, place, and time.     ED Results / Procedures / Treatments   Labs (all labs ordered are listed, but only abnormal results are displayed) Labs Reviewed - No data to display  EKG None  Radiology DG Ribs Unilateral W/Chest Left  Result Date: 07/29/2021 CLINICAL DATA:  Left anterior rib pain after falling out of bed. EXAM: LEFT RIBS AND CHEST - 3+ VIEW COMPARISON:  Chest x-ray dated March 03, 2013. FINDINGS: Acute nondisplaced fracture of the left anterior seventh rib. No focal consolidation, pleural effusion, or pneumothorax. The heart size and mediastinal contours are within normal limits. IMPRESSION: 1. Acute nondisplaced fracture of the left anterior seventh rib. 2. No acute cardiopulmonary disease. Electronically Signed   By: Obie Dredge M.D.   On: 07/29/2021 12:51   CT Head Wo Contrast  Result Date: 07/29/2021 CLINICAL DATA:  Head trauma EXAM: CT HEAD WITHOUT CONTRAST TECHNIQUE: Contiguous axial images were obtained from the base of the skull through the vertex without intravenous contrast. RADIATION DOSE REDUCTION: This exam was performed according to the departmental dose-optimization program which includes automated exposure control, adjustment of the mA and/or kV according to patient size and/or use of iterative reconstruction technique. COMPARISON:  Head CT dated January 09, 2015 FINDINGS: Brain: No evidence of acute infarction, hemorrhage, hydrocephalus, extra-axial collection or mass lesion/mass effect. Vascular: No hyperdense vessel or unexpected calcification. Skull: Right superior skull burr hole, unchanged when compared with prior. Negative for fracture or focal lesion. Sinuses/Orbits: No acute finding. Other: None. IMPRESSION: No acute intracranial abnormality. Electronically Signed   By: Allegra Lai M.D.   On: 07/29/2021 12:42    Procedures Procedures    Medications Ordered in ED Medications - No data to display  ED Course/ Medical Decision Making/ A&P                           Medical Decision Making Amount and/or Complexity of Data Reviewed Radiology: ordered.   This patient presents to the ED with chief complaint(s) of pain in his chest after a fall with pertinent past medical history of Plavix use which further complicates the presenting complaint.    The differential diagnosis includes rib fracture, pneumothorax, subdural hematoma  Our exam is  overall reassuring.  CT scan of the brain was ordered in triage, and it is negative for any acute finding.  During exam, patient is noted to have focal left anterolateral tenderness to palpation over the inferior part of his left chest.  X-ray concerning for singular rib fracture without any pneumothorax.   Additional history obtained: Additional history obtained from spouse   Independent visualization of imaging: - I independently visualized the following imaging with scope of interpretation limited to determining acute life threatening conditions related to emergency care: CT scan of the head, x-ray of the chest, which revealed no evidence of brain bleed, no evidence of pneumothorax  Treatment and Reassessment: Patient is quite comfortable at this time.  He does not think he needs incentive  spirometer.  He does not want any strong pain medications. Advise follow-up with PCP as needed.   Final Clinical Impression(s) / ED Diagnoses Final diagnoses:  Closed fracture of one rib of left side, initial encounter    Rx / DC Orders ED Discharge Orders     None         Derwood Kaplan, MD 07/29/21 1545

## 2022-07-07 ENCOUNTER — Encounter: Payer: Self-pay | Admitting: Cardiology

## 2022-07-07 ENCOUNTER — Ambulatory Visit: Payer: Medicare Other | Attending: Cardiology | Admitting: Cardiology

## 2022-07-07 VITALS — BP 104/62 | HR 80 | Ht 73.0 in | Wt 190.0 lb

## 2022-07-07 DIAGNOSIS — Z8679 Personal history of other diseases of the circulatory system: Secondary | ICD-10-CM | POA: Diagnosis not present

## 2022-07-07 DIAGNOSIS — Z8673 Personal history of transient ischemic attack (TIA), and cerebral infarction without residual deficits: Secondary | ICD-10-CM

## 2022-07-07 DIAGNOSIS — E785 Hyperlipidemia, unspecified: Secondary | ICD-10-CM | POA: Diagnosis not present

## 2022-07-07 DIAGNOSIS — I480 Paroxysmal atrial fibrillation: Secondary | ICD-10-CM | POA: Diagnosis not present

## 2022-07-07 NOTE — Patient Instructions (Signed)
Medication Instructions:  Your physician recommends that you continue on your current medications as directed. Please refer to the Current Medication list given to you today.  *If you need a refill on your cardiac medications before your next appointment, please call your pharmacy*  Follow-Up: At [facility], you and your health needs are our priority.  As part of our continuing mission to provide you with exceptional heart care, we have created designated Provider Care Teams.  These Care Teams include your primary Cardiologist (physician) and Advanced Practice Providers (APPs -  Physician Assistants and Nurse Practitioners) who all work together to provide you with the care you need, when you need it.  Your next appointment:   1 year(s)  Provider:   You may see CAMERON T LAMBERT, MD or one of the following Advanced Practice Providers on your designated Care Team:   Renee Ursuy, PA-C Michael "Andy" Tillery, PA-C Suzann Riddle, NP     

## 2022-07-07 NOTE — Progress Notes (Signed)
  Electrophysiology Office Follow up Visit Note:    Date:  07/07/2022   ID:  Gregg Guerrero, DOB [birth year], MRN [id number]  PCP:  Sigmund Hazel, MD  [facility] Cardiologist:  Garwin Brothers, MD  [facility] Electrophysiologist:  Lanier Prude, MD    Interval History:    Gregg Guerrero is a 72 y.o. male who presents for a follow up visit.   Last seen March 21, 2021 by Dr. Johney Frame.  He has a history of atrial fibrillation with a prior PVI at [facility] in 2009.  Also has a history of TIA, migraines, patent foramen ovale, restless leg syndrome and sleep apnea.  He has a history of a subdural hematoma with a bur hole created in 2016.  At the time of his last appointment with Dr. Johney Frame he was not on oral anticoagulation given his history of subdural hematoma.  He is on Plavix.  He is with his wife today in clinic.  He remains very active.  Does report some dizziness after he stands from a crouched position.  No syncope or presyncope.     Past medical, surgical, social and family history were reviewed.  ROS:   Please see the history of present illness.    All other systems reviewed and are negative.  EKGs/Labs/Other Studies Reviewed:    The following studies were reviewed today:  EKG:  The ekg ordered today demonstrates sinus rhythm.   Physical Exam:    VS:  BP 104/62   Pulse 80   Ht 6\' 1"  (1.854 m)   Wt 190 lb (86.2 kg)   SpO2 96%   BMI 25.07 kg/m     Wt Readings from Last 3 Encounters:  07/07/22 190 lb (86.2 kg)  07/29/21 197 lb 15.6 oz (89.8 kg)  05/16/21 197 lb 14.4 oz (89.8 kg)     GEN:  Well nourished, well developed in no acute distress CARDIAC: RRR, no murmurs, rubs, gallops RESPIRATORY:  Clear to auscultation without rales, wheezing or rhonchi       ASSESSMENT:    1. Paroxysmal atrial fibrillation (HCC)   2. History of subdural hematoma   3. History of TIA (transient ischemic attack)   4. Hyperlipidemia, unspecified hyperlipidemia  type    PLAN:    In order of problems listed above:   #Paroxysmal atrial fibrillation Doing well.  No recurrence.  Not on oral anticoagulation given history of subdural hematoma.  #History of TIA No OAC as above  #Hyperlipidemia Continue statin  Follow-up 1 year with APP.       Signed, Steffanie Dunn, MD, [facility], [facility] 07/07/2022 6:13 PM    Electrophysiology [facility]

## 2022-09-24 ENCOUNTER — Ambulatory Visit: Payer: Medicare Other | Admitting: Cardiology

## 2022-10-23 IMAGING — CT CT ABD-PELV W/ CM
1 of 3 series · 14 of 32 positions shown, 19 images · IV contrast (agent unspecified)
Comparison: 04/02/2020

CLINICAL DATA: Right side pain.  Evaluate for inguinal hernia.

EXAM:
CT ABDOMEN AND PELVIS WITH CONTRAST
TECHNIQUE: Multidetector CT imaging of the abdomen and pelvis was performed
using the standard protocol following bolus administration of
intravenous contrast.

[Series 2: a/p w/ 5mm · axial · 0.88mm/px · z∈[-522,-42]mm · 14 of 110 slices shown, 19 images]
[im 7/110  soft-tissue]
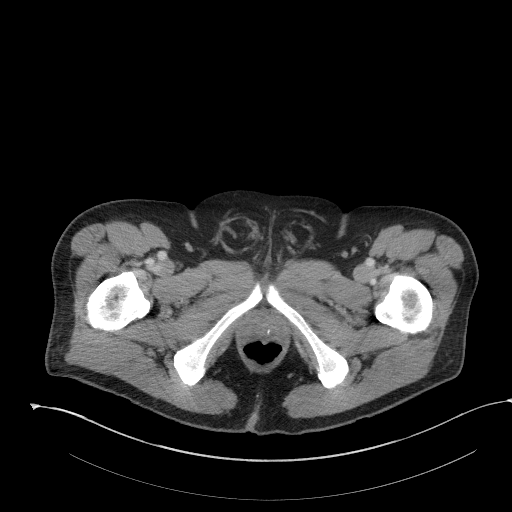
[im 7/110  bone]
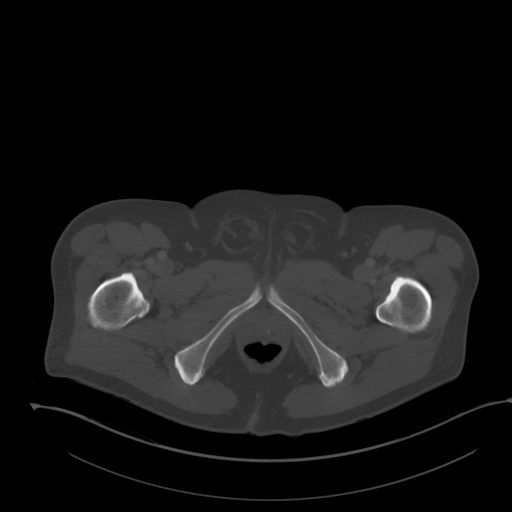
[im 13/110  soft-tissue]
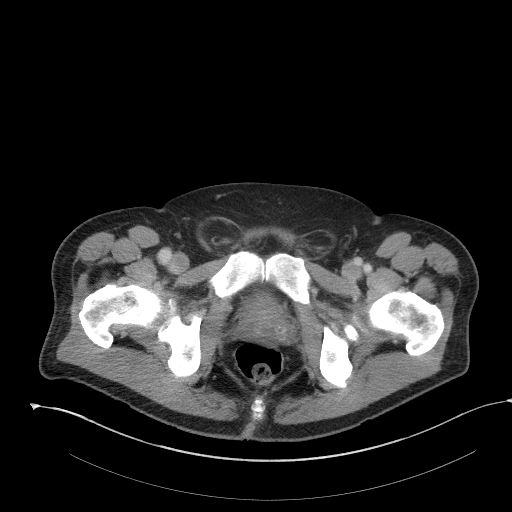
[im 25/110  soft-tissue]
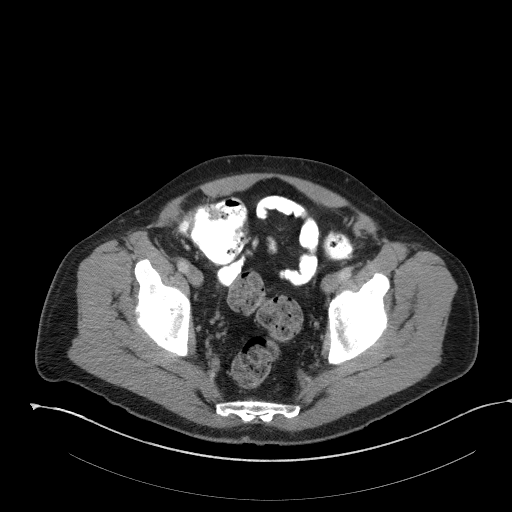
[im 31/110  soft-tissue]
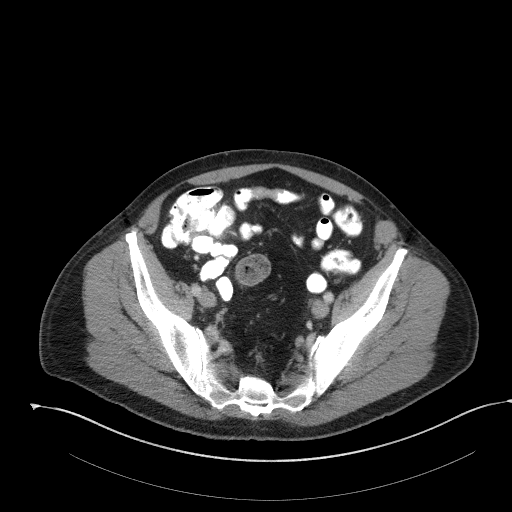
[im 37/110  soft-tissue]
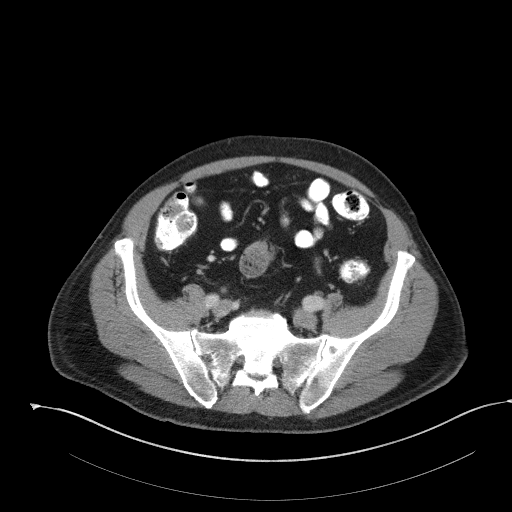
[im 49/110  soft-tissue]
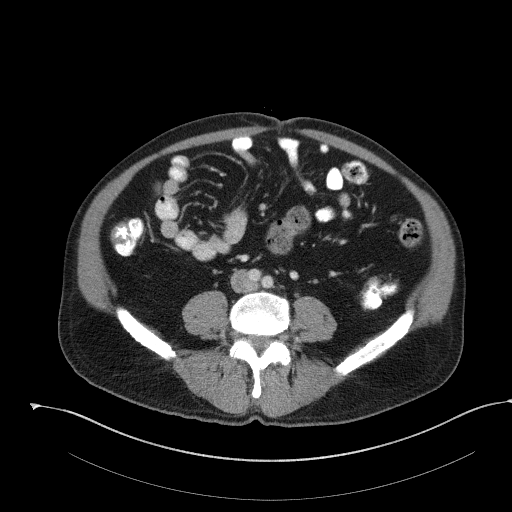
[im 55/110  soft-tissue]
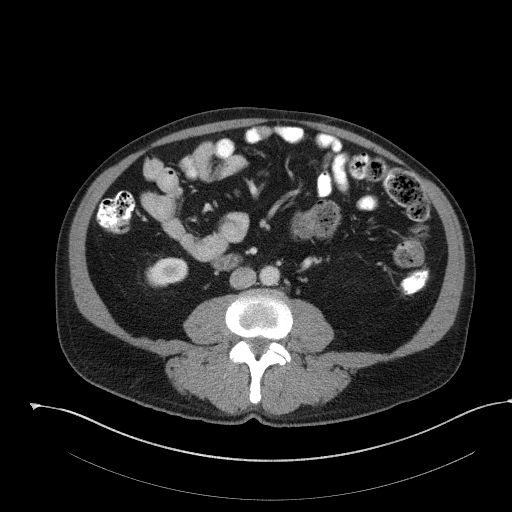
[im 61/110  soft-tissue]
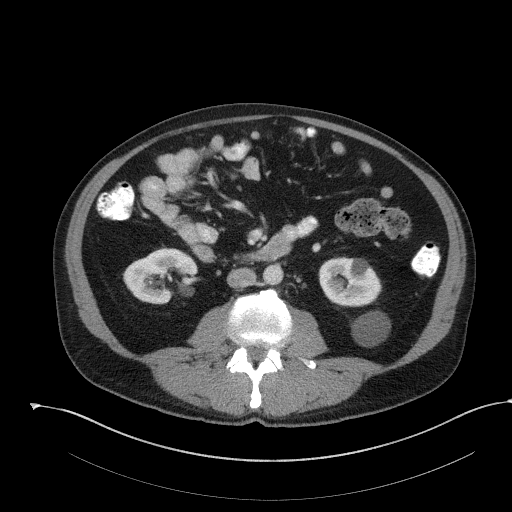
[im 73/110  soft-tissue]
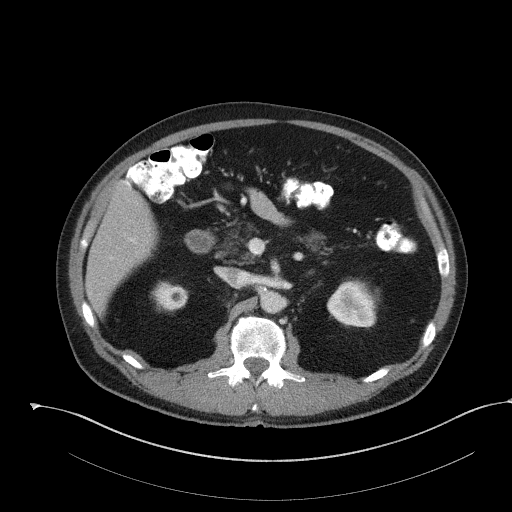
[im 73/110  bone]
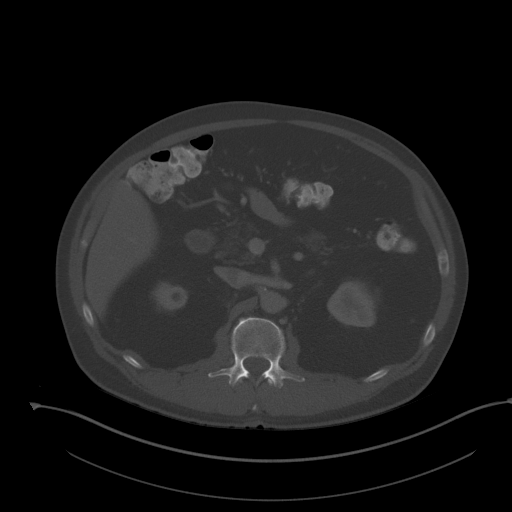
[im 79/110  soft-tissue]
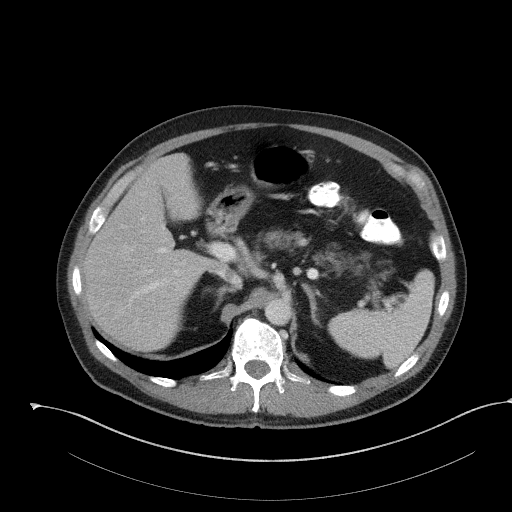
[im 85/110  soft-tissue]
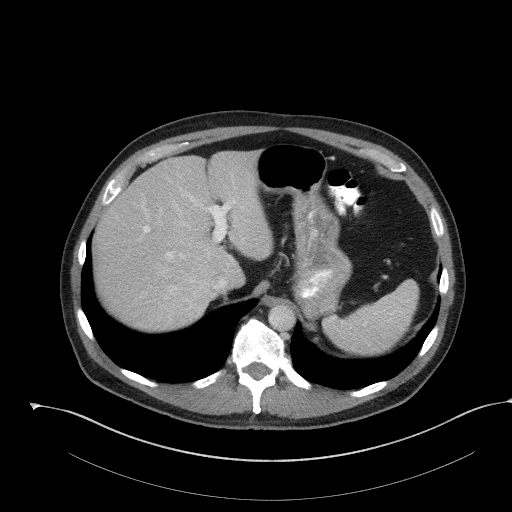
[im 85/110  lung]
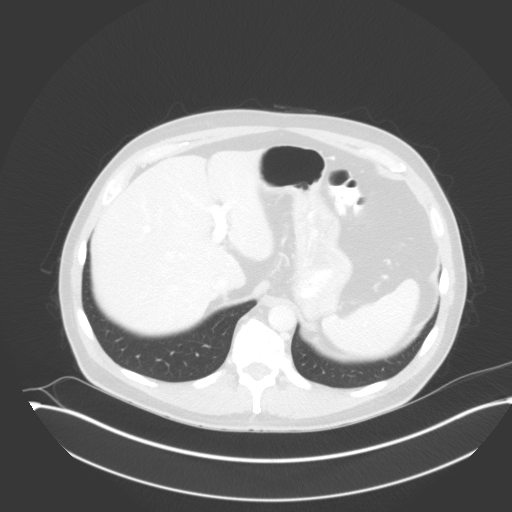
[im 91/110  lung]
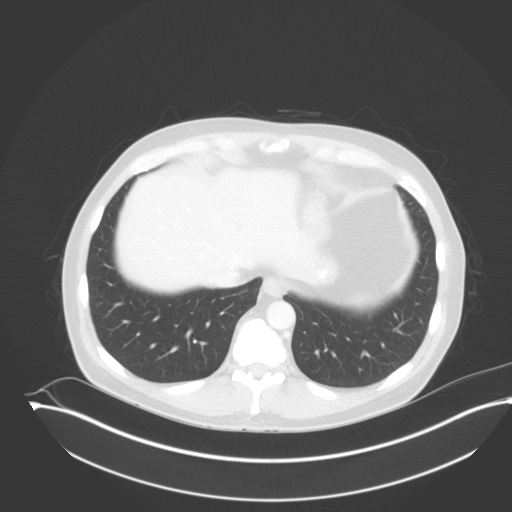
[im 97/110  soft-tissue]
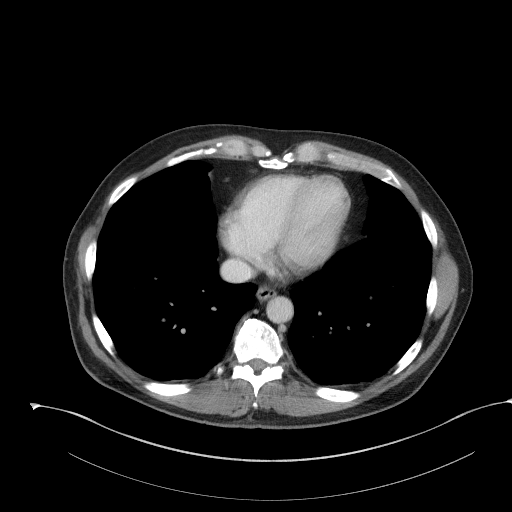
[im 97/110  lung]
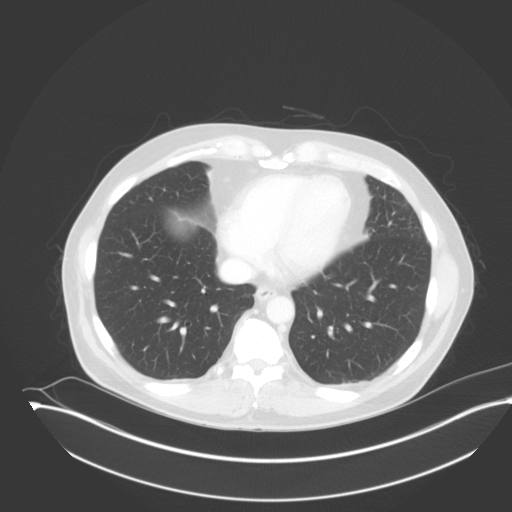
[im 103/110  soft-tissue]
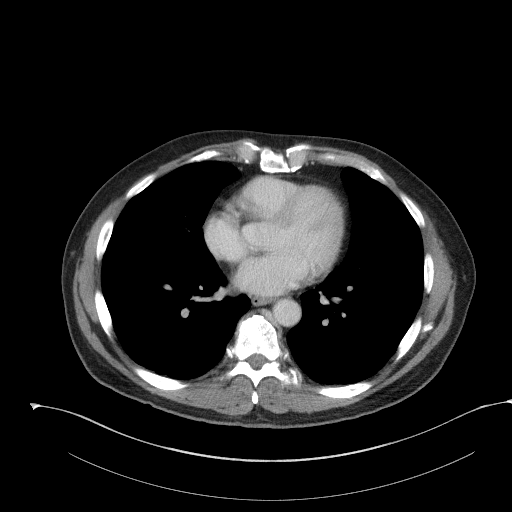
[im 103/110  lung]
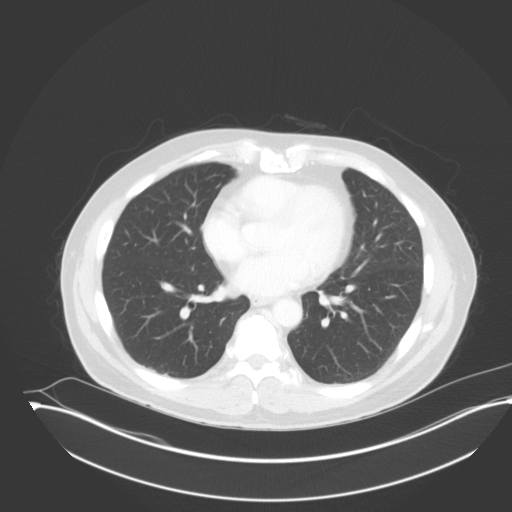

[14 of 32 positions shown; findings below may reference images not displayed]

RADIATION DOSE REDUCTION: This exam was performed according to the
departmental dose-optimization program which includes automated
exposure control, adjustment of the mA and/or kV according to
patient size and/or use of iterative reconstruction technique.

CONTRAST:  100mL [id number] IOPAMIDOL ([id number]) INJECTION 61%
FINDINGS: Lower chest: No acute abnormality

Hepatobiliary: Low-density lesion in the posterior right hepatic
lobe measures 2 cm compared with 2.9 cm previously. This
demonstrates peripheral puddling of contrast and most compatible
with hemangioma. No suspicious focal hepatic abnormality.
Gallbladder unremarkable.

Pancreas: No focal abnormality or ductal dilatation.

Spleen: No focal abnormality.  Normal size.

Adrenals/Urinary Tract: Adrenal glands normal. Numerous bilateral
renal cysts. The largest is an exophytic cyst off the posterior left
kidney measuring 3.7 cm, stable since prior study. No
hydronephrosis. Urinary bladder unremarkable.

Stomach/Bowel: Stomach, large and small bowel grossly unremarkable.

Vascular/Lymphatic: Scattered aortic atherosclerosis. No evidence of
aneurysm or adenopathy.

Reproductive: Prostate enlargement with calcifications.

Other: Bilateral inguinal hernias containing fat, stable since prior
study. No free fluid or free air.

Musculoskeletal: No acute bony abnormality.
IMPRESSION: Small bilateral inguinal hernias containing fat, similar to prior
study.

No acute findings in the abdomen or pelvis.

Ancillary findings as above.

## 2022-11-11 ENCOUNTER — Ambulatory Visit
Admission: RE | Admit: 2022-11-11 | Discharge: 2022-11-11 | Disposition: A | Discharge status: Home / Self Care | Payer: Medicare Other | Source: Ambulatory Visit | Attending: Pain Medicine | Admitting: Pain Medicine

## 2022-11-11 ENCOUNTER — Other Ambulatory Visit: Payer: Self-pay | Admitting: Pain Medicine

## 2022-11-11 DIAGNOSIS — J189 Pneumonia, unspecified organism: Secondary | ICD-10-CM

## 2022-11-11 MED ORDER — IOPAMIDOL (ISOVUE-300) INJECTION 61%
80.0000 mL | Freq: Once | INTRAVENOUS | Status: AC | PRN
  Administered 2022-11-11: 80 mL via INTRAVENOUS

## 2022-11-12 ENCOUNTER — Encounter: Payer: Self-pay | Admitting: Pain Medicine

## 2022-11-12 ENCOUNTER — Other Ambulatory Visit (HOSPITAL_COMMUNITY): Payer: Self-pay | Admitting: Family Medicine

## 2022-11-12 DIAGNOSIS — R918 Other nonspecific abnormal finding of lung field: Secondary | ICD-10-CM

## 2022-11-12 DIAGNOSIS — R9389 Abnormal findings on diagnostic imaging of other specified body structures: Secondary | ICD-10-CM

## 2022-11-17 ENCOUNTER — Encounter (HOSPITAL_COMMUNITY)
Admission: RE | Admit: 2022-11-17 | Discharge: 2022-11-17 | Disposition: A | Discharge status: Home / Self Care | Payer: Medicare Other | Source: Ambulatory Visit | Attending: Family Medicine | Admitting: Family Medicine

## 2022-11-17 DIAGNOSIS — R918 Other nonspecific abnormal finding of lung field: Secondary | ICD-10-CM | POA: Diagnosis present

## 2022-11-17 DIAGNOSIS — R9389 Abnormal findings on diagnostic imaging of other specified body structures: Secondary | ICD-10-CM | POA: Insufficient documentation

## 2022-11-17 LAB — GLUCOSE, CAPILLARY: Glucose-Capillary: 102 mg/dL — ABNORMAL HIGH (ref 70–99)

## 2022-11-17 MED ORDER — FLUDEOXYGLUCOSE F - 18 (FDG) INJECTION
9.5000 | Freq: Once | INTRAVENOUS | Status: AC
  Administered 2022-11-17: 9.35 via INTRAVENOUS

## 2022-11-19 ENCOUNTER — Encounter (HOSPITAL_COMMUNITY)
Admission: RE | Admit: 2022-11-19 | Discharge: 2022-11-19 | Disposition: A | Discharge status: Home / Self Care | Payer: Medicare Other | Source: Ambulatory Visit | Attending: Family Medicine | Admitting: Family Medicine

## 2022-11-19 DIAGNOSIS — R918 Other nonspecific abnormal finding of lung field: Secondary | ICD-10-CM | POA: Insufficient documentation

## 2022-11-19 DIAGNOSIS — R9389 Abnormal findings on diagnostic imaging of other specified body structures: Secondary | ICD-10-CM | POA: Insufficient documentation

## 2022-11-19 MED ORDER — TECHNETIUM TC 99M MEDRONATE IV KIT
20.0000 | PACK | Freq: Once | INTRAVENOUS | Status: AC | PRN
  Administered 2022-11-19: 20 via INTRAVENOUS

## 2022-12-12 NOTE — Progress Notes (Unsigned)
History of Present Illness Gregg Guerrero is a 72 y.o. male with remote history of second hand smoke exposure referred by his PCP Dr. Sigmund Guerrero for consideration of a robotic bronchoscopy with biopsy of a suspicious lung mass and lesion. Consult was done by Kandice Robinsons, NP to expedite patient getting evaluated and determination of diagnosis.     Synopsis Pt has been under the care of his PCP for what was initially thought to be a pneumonia on CXR. Patient developed shortness of breath September 25 and was seen by his primary care physician.  At that time a chest x-ray was done which showed an infiltrate and question of atypical pneumonia.  The patient was treated with antibiotics however his symptoms did not improve.  A CT of the chest was ordered to further evaluate etiology for nonresolving dyspnea. CT was done 11/11/2022 which showed a spiculated nodule in the lingula with diffuse interstitial thickening in the left upper lobe that was concerning for an aggressive process or malignancy.  Additionally there were scattered sclerotic bone lesions diffusely along the skeleton, and numerous calcified lymph nodes throughout the mediastinum and left hilum. Patient's PCP Dr. Hyacinth Guerrero then ordered a PET scan in addition to a whole-body bone scan.The PET scan was notable for a 3.9 cm irregular spiculated mass in the left upper lobe, suspicious for bronchogenic carcinoma. 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 Whole body bole scan confirmed  Widespread osseous metastases. Pt. Denies any associated pain.   Pt. Was referred to [facility] for consideration of robotic assisted bronchoscopy with biopsies to further evaluate the left upper lobe lesion.  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 Per his wife the melanoma was low grade and did not require any further treatment after excision.  He also has a chronic minimally productive cough, and poor appetite and nausea. However, once he starts to eat he is fine, and is able to keep his food down. VS were stable today. BP was elevated but patient was very anxious. Pulse is 88, oxygen saturation was 99% on Room Air. He states his dyspnea is better. I was initially concerned for PE, but as dyspnea has improved and vital signs are normal, I am less concerned this is an issue.  12/17/2022 Pt. 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 He is here today with his wife. We have reviewed the CT Chest and PET scan. We discussed that the left upper lobe nodule and several lymph nodes are concerning , and that we need to proceed with a biopsy to evaluate for malignancy. Additionally we will see if we can arrange for Interventional radiology to do a biopsy of one of the bone lesions at the same time. Patient is on Plavix for TIA's per his PCP Dr. Sigmund Guerrero. I have spoken with Dr. Rondel Guerrero nurse Gregg Guerrero, who has  confirmed with Dr. Hyacinth Guerrero that it is fine for patient to come off his Plavix x 5 days for bronchoscopy and biopsies. If bone biopsy is also needed we will see if we can schedule concurrently with Bronch so he only has to come off his Plavix once.   Bronchoscopy has been  arranged for 12/23/2022 at 9 am by Dr. Aundria Guerrero. Dr. Titus Guerrero has viewed patient PET and Bone scan. He has requested CT of thoracic and lumbar spine to be done 11/21 with Super D CT Chest. Orders have been placed. Pt has a additional health history of Hyperlipidemia, OSA on CPAP , and recent rib injury .    Test Results: CT Chest 11/11/2022 Spiculated nodule in the  lingula with diffuse interstitial thickening in the left upper lobe. This has has a differential including aggressive process or malignancy.   In addition there are scattered sclerotic bone lesions diffusely along the skeleton. With the changes please correlate for any known history of malignancy otherwise further workup is recommended such as a PET-CT scan and whole-body bone scan.   Numerous calcified lymph nodes throughout the mediastinum and left hilum. Please correlate for any known history including old granulomatous disease or other chronic process.   Aortic Atherosclerosis (ICD10-I70.0). PET Scan 11/17/2022 IMPRESSION: 3.9 cm irregular spiculated mass in the left upper lobe, suspicious for primary bronchogenic carcinoma.   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   Calcified thoracic lymphadenopathy, as above. While calcified nodes are typically benign, attention on follow-up is suggested.   Widespread/diffuse osseous metastases throughout the visualized axial and appendicular skeleton    11/19/2022 [place] Bone Scan Whole Body Super scan appearance with increased radiotracer uptake throughout the visualized axial skeleton, bilateral ribs, right greater than left shoulders, bony pelvis, and left greater than right proximal femurs.   Associated diminished renal uptake.   IMPRESSION: Widespread osseous metastases throughout the visualized axial and appendicular skeleton, as above.    Latest Ref Rng & Units 05/09/2021    1:43 PM 07/11/2014    3:07 PM 02/03/2013    9:19 AM  CBC  WBC 4.0 - 10.5 K/uL 5.2  6.2    Hemoglobin 13.0 - 17.0 g/dL 16.1  09.6  04.5   Hematocrit 39.0 - 52.0 % 47.5  48.4    Platelets 150 - 400 K/uL 191  207         Latest Ref Rng & Units 05/09/2021    1:43 PM 07/11/2014    3:07 PM 04/05/2014    4:22 PM  BMP  Glucose 70 - 99 mg/dL 409  811    BUN 8 - 23 mg/dL 18  19  20    Creatinine 0.61 - 1.24 mg/dL 9.14  7.82  9.56    Sodium 135 - 145 mmol/L 142  139    Potassium 3.5 - 5.1 mmol/L 4.4  4.6    Chloride 98 - 111 mmol/L 111  106    CO2 22 - 32 mmol/L 24  26    Calcium 8.9 - 10.3 mg/dL 9.2  9.0      BNP No results found for: "BNP"  ProBNP No results found for: "PROBNP"  PFT No results found for: "FEV1PRE", "FEV1POST", "FVCPRE", "FVCPOST", "TLC", "DLCOUNC", "PREFEV1FVCRT", "PSTFEV1FVCRT"  NM Bone Scan Whole Body  Result Date: 12/04/2022 CLINICAL DATA:  Pulmonary nodules, osseous lesions on CT EXAM: NUCLEAR MEDICINE WHOLE BODY BONE SCAN TECHNIQUE: Whole body anterior and posterior images were obtained approximately 3 hours after intravenous injection of radiopharmaceutical. RADIOPHARMACEUTICALS:  20.9 mCi Technetium-78m MDP IV COMPARISON:  CT chest dated 11/11/2022. CT abdomen/pelvis dated 03/19/2021. FINDINGS: Super scan appearance with increased radiotracer uptake throughout the visualized axial skeleton, bilateral ribs, right greater than left shoulders, bony pelvis, and left greater than right proximal femurs. Associated diminished renal uptake. IMPRESSION: Widespread osseous metastases throughout the  visualized axial and appendicular skeleton, as above. Electronically Signed   By: Charline Bills M.D.   On: 12/04/2022 22:53     Past medical hx Past Medical History:  Diagnosis Date   Anxiety    Arthritis    Atrial fibrillation [facility])    s/p PVI at [facility] by Dr Sampson Goon in 2009   Concussion 12/2014   GERD (gastroesophageal reflux disease)    Headache    History of kidney stones    History of TIA (transient ischemic attack)    hx of 2   Migraine    hx of   PFO (patent foramen ovale)    RLS (restless legs syndrome)    SDH (subdural hematoma) (HCC)    s/p right parietal burr hole for subacute SDH evacuation 07/20/14   Sleep apnea    mild-no cpap needed   TIA (transient ischemic attack)      Social History   Tobacco Use   Smoking status: Never    Passive exposure: Past   Smokeless  tobacco: Never  Vaping Use   Vaping status: Never Used  Substance Use Topics   Alcohol use: Yes    Comment: rare   Drug use: No    Mr.Harlin reports that he has never smoked. He has been exposed to tobacco smoke. He has never used smokeless tobacco. He reports current alcohol use. He reports that he does not use drugs.  Tobacco Cessation: Never smoker    Past surgical hx, Family hx, Social hx all reviewed.  Current Outpatient Medications on File Prior to Visit  Medication Sig   atorvastatin (LIPITOR) 10 MG tablet Take 10 mg by mouth daily.   Cholecalciferol (DIALYVITE VITAMIN D 5000) 125 MCG (5000 UT) capsule Take 1,500 Units by mouth every Saturday.   clonazePAM (KLONOPIN) 1 MG tablet Take 1 mg by mouth at bedtime.   clopidogrel (PLAVIX) 75 MG tablet Take 75 mg by mouth daily.   Ferrous Sulfate (IRON) 325 (65 FE) MG TABS Take 325 mg by mouth daily.    fluticasone (FLONASE) 50 MCG/ACT nasal spray Place 1 spray into both nostrils daily as needed for allergies.   latanoprost (XALATAN) 0.005 % ophthalmic solution Place 1 drop into both eyes daily.   loratadine (CLARITIN) 10 MG tablet Take 10 mg by mouth daily.   MAGNESIUM PO Take 1 tablet by mouth daily.   Multiple Vitamin (MULTIVITAMIN) tablet Take 1 tablet by mouth daily.   traZODone (DESYREL) 50 MG tablet Take 25 mg by mouth at bedtime.   No current facility-administered medications on file prior to visit.     No Known Allergies  Review Of Systems:  Constitutional:   +  weight loss, No night sweats,  Fevers, chills, fatigue, or  lassitude.  HEENT:   No headaches,  Difficulty swallowing,  Tooth/dental problems, or  Sore throat,                No sneezing, itching, ear ache, nasal congestion, post nasal drip,   CV:  No chest pain,  Orthopnea, PND, swelling in lower extremities, anasarca, dizziness, palpitations, syncope.   GI  No heartburn, indigestion, abdominal pain, +nausea, No vomiting, diarrhea, change in bowel  habits, loss of appetite, bloody stools.   Resp: + shortness of breath with exertion or at rest.  No excess mucus, no productive cough,  + non-productive cough,  No coughing up of blood.  No change in color of mucus.  + occasional wheezing while supine ,   No  chest wall deformity  Skin: no rash or lesions.Scar to face where patient has excision of low grade melanoma  GU: no dysuria, change in color of urine, no urgency or frequency.  No flank pain, no hematuria   MS:  No joint pain or swelling.  No decreased range of motion.  No back pain.  Psych:  No change in mood or affect. No depression , + anxiety.  No memory loss.   Vital Signs BP (!) 140/66 (BP Location: Right Arm, Cuff Size: Normal)   Pulse 88   Temp 97.7 F (36.5 C) (Oral)   Ht 6\' 1"  (1.854 m)   Wt 188 lb (85.3 kg)   SpO2 99%   BMI 24.80 kg/m    Physical Exam:  General- No distress,  A&Ox3, pleasant ENT: No sinus tenderness, TM clear, pale nasal mucosa, no oral exudate,no post nasal drip, no LAN Cardiac: S1, S2, regular rate and rhythm, no murmur Chest: No wheeze/ rales/ dullness; no accessory muscle use, no nasal flaring, no sternal retractions Abd.: Soft Non-tender, ND, BS +, Body mass index is 24.8 kg/m.  Ext: No clubbing cyanosis, edema Neuro:  normal strength,  MAE x 4, A&O x 3, appropriate  Skin: No rashes, warm and dry, no lesions  Psych: normal mood and behavior, appropriately anxious    Assessment/Plan Never smoker with 3.9 cm irregular spiculated mass in the left upper lobe Numerous calcified lymph nodes throughout the mediastinum and left hilum Widespread/diffuse osseous metastases per PET scan and Bone Scan  Plan Lung Mass Left upper lobe mass with speculated appearance on imaging, concerning for malignancy. Also noted weight loss and chronic cough. No significant smoking history. -Schedule bronchoscopy for biopsy under general anesthesia. -Biopsy is scheduled for 12/23/2022 with Dr. Aundria Guerrero at [facility] -We have discussed the risks of infection, bleeding, pneumothorax, and adverse effects of anesthesia. We will need to hold  your plavix for 5 days before your procedure.  -Stop Plavix 11/21 -You need someone to drive you to the procedure, stay while its being done, and drive you   home, and stay with you for 24 hours.  -Super DCT for navigational bronchoscopy preparation is scheduled for 12/18/2022 at [facility] -Thoracic and Lumbar Spine CT's will be done at the same time for bone biopsy.   Bone Lesions Detected on PET scan, potentially metastatic if lung mass is confirmed malignant. -Consider biopsy if feasible during bronchoscopy procedure. -Plan is for concurrent lung can bone biopsy for single anesthetic event and one time hold of Plavix therapy.  Chronic Cough Persistent despite treatment for presumed pneumonia. -Try over-the-counter cough suppressants such as Delsom or Robitussin. -let us know if you would like something stronger which will likely make you sleepy.  Nausea and Decreased Appetite Ongoing for several months, contributing to weight loss. -Consider Zofran sublingual for nausea control. -Encourage protein shakes like Ensure for nutritional support.  Anticoagulation On Plavix for history of TIAs and patent foramen ovale. -Hold Plavix for 5 days prior to bronchoscopy procedure. -Hold starting 12/18/2022  Follow-up -Return to clinic 1 week post-biopsy for results and further management planning. -Consider referral to medical oncology, radiation oncology, or surgery based on biopsy results. -Consider genetic and molecular marker testing of biopsied material  I spent 90 minutes dedicated to the care of this patient on the date of this encounter to include pre-visit review of records, face-to-face time with the patient discussing conditions above, post visit ordering of testing, clinical documentation with the electronic health  record, making  appropriate referrals as documented, and communicating necessary information to the patient's healthcare team.    Bevelyn Ngo, NP 12/17/2022  3:40 PM

## 2022-12-12 NOTE — H&P (View-Only) (Signed)
History of Present Illness Gregg Guerrero is a 72 y.o. male with remote history of second hand smoke exposure referred by his PCP Dr. Sigmund Hazel for consideration of a robotic bronchoscopy with biopsy of a suspicious lung mass and lesion. Consult was done by Kandice Robinsons, NP to expedite patient getting evaluated and determination of diagnosis.     Synopsis Pt has been under the care of his PCP for what was initially thought to be a pneumonia on CXR. Patient developed shortness of breath September 25 and was seen by his primary care physician.  At that time a chest x-ray was done which showed an infiltrate and question of atypical pneumonia.  The patient was treated with antibiotics however his symptoms did not improve.  A CT of the chest was ordered to further evaluate etiology for nonresolving dyspnea. CT was done 11/11/2022 which showed a spiculated nodule in the lingula with diffuse interstitial thickening in the left upper lobe that was concerning for an aggressive process or malignancy.  Additionally there were scattered sclerotic bone lesions diffusely along the skeleton, and numerous calcified lymph nodes throughout the mediastinum and left hilum. Patient's PCP Dr. Hyacinth Meeker then ordered a PET scan in addition to a whole-body bone scan.The PET scan was notable for a 3.9 cm irregular spiculated mass in the left upper lobe, suspicious for bronchogenic carcinoma. 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 Whole body bole scan confirmed  Widespread osseous metastases. Pt. Denies any associated pain.   Pt. Was referred to [facility] for consideration of robotic assisted bronchoscopy with biopsies to further evaluate the left upper lobe lesion.  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 Per his wife the melanoma was low grade and did not require any further treatment after excision.  He also has a chronic minimally productive cough, and poor appetite and nausea. However, once he starts to eat he is fine, and is able to keep his food down. VS were stable today. BP was elevated but patient was very anxious. Pulse is 88, oxygen saturation was 99% on Room Air. He states his dyspnea is better. I was initially concerned for PE, but as dyspnea has improved and vital signs are normal, I am less concerned this is an issue.  12/17/2022 Pt. 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 He is here today with his wife. We have reviewed the CT Chest and PET scan. We discussed that the left upper lobe nodule and several lymph nodes are concerning , and that we need to proceed with a biopsy to evaluate for malignancy. Additionally we will see if we can arrange for Interventional radiology to do a biopsy of one of the bone lesions at the same time. Patient is on Plavix for TIA's per his PCP Dr. Sigmund Hazel. I have spoken with Dr. Rondel Baton nurse Darl Pikes, who has  confirmed with Dr. Hyacinth Meeker that it is fine for patient to come off his Plavix x 5 days for bronchoscopy and biopsies. If bone biopsy is also needed we will see if we can schedule concurrently with Bronch so he only has to come off his Plavix once.   Bronchoscopy has been  arranged for 12/23/2022 at 9 am by Dr. Aundria Rud. Dr. Titus Dubin has viewed patient PET and Bone scan. He has requested CT of thoracic and lumbar spine to be done 11/21 with Super D CT Chest. Orders have been placed. Pt has a additional health history of Hyperlipidemia, OSA on CPAP , and recent rib injury .    Test Results: CT Chest 11/11/2022 Spiculated nodule in the  lingula with diffuse interstitial thickening in the left upper lobe. This has has a differential including aggressive process or malignancy.   In addition there are scattered sclerotic bone lesions diffusely along the skeleton. With the changes please correlate for any known history of malignancy otherwise further workup is recommended such as a PET-CT scan and whole-body bone scan.   Numerous calcified lymph nodes throughout the mediastinum and left hilum. Please correlate for any known history including old granulomatous disease or other chronic process.   Aortic Atherosclerosis (ICD10-I70.0). PET Scan 11/17/2022 IMPRESSION: 3.9 cm irregular spiculated mass in the left upper lobe, suspicious for primary bronchogenic carcinoma.   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   Calcified thoracic lymphadenopathy, as above. While calcified nodes are typically benign, attention on follow-up is suggested.   Widespread/diffuse osseous metastases throughout the visualized axial and appendicular skeleton    11/19/2022 [place] Bone Scan Whole Body Super scan appearance with increased radiotracer uptake throughout the visualized axial skeleton, bilateral ribs, right greater than left shoulders, bony pelvis, and left greater than right proximal femurs.   Associated diminished renal uptake.   IMPRESSION: Widespread osseous metastases throughout the visualized axial and appendicular skeleton, as above.    Latest Ref Rng & Units 05/09/2021    1:43 PM 07/11/2014    3:07 PM 02/03/2013    9:19 AM  CBC  WBC 4.0 - 10.5 K/uL 5.2  6.2    Hemoglobin 13.0 - 17.0 g/dL 16.1  09.6  04.5   Hematocrit 39.0 - 52.0 % 47.5  48.4    Platelets 150 - 400 K/uL 191  207         Latest Ref Rng & Units 05/09/2021    1:43 PM 07/11/2014    3:07 PM 04/05/2014    4:22 PM  BMP  Glucose 70 - 99 mg/dL 409  811    BUN 8 - 23 mg/dL 18  19  20    Creatinine 0.61 - 1.24 mg/dL 9.14  7.82  9.56    Sodium 135 - 145 mmol/L 142  139    Potassium 3.5 - 5.1 mmol/L 4.4  4.6    Chloride 98 - 111 mmol/L 111  106    CO2 22 - 32 mmol/L 24  26    Calcium 8.9 - 10.3 mg/dL 9.2  9.0      BNP No results found for: "BNP"  ProBNP No results found for: "PROBNP"  PFT No results found for: "FEV1PRE", "FEV1POST", "FVCPRE", "FVCPOST", "TLC", "DLCOUNC", "PREFEV1FVCRT", "PSTFEV1FVCRT"  NM Bone Scan Whole Body  Result Date: 12/04/2022 CLINICAL DATA:  Pulmonary nodules, osseous lesions on CT EXAM: NUCLEAR MEDICINE WHOLE BODY BONE SCAN TECHNIQUE: Whole body anterior and posterior images were obtained approximately 3 hours after intravenous injection of radiopharmaceutical. RADIOPHARMACEUTICALS:  20.9 mCi Technetium-34m MDP IV COMPARISON:  CT chest dated 11/11/2022. CT abdomen/pelvis dated 03/19/2021. FINDINGS: Super scan appearance with increased radiotracer uptake throughout the visualized axial skeleton, bilateral ribs, right greater than left shoulders, bony pelvis, and left greater than right proximal femurs. Associated diminished renal uptake. IMPRESSION: Widespread osseous metastases throughout the  visualized axial and appendicular skeleton, as above. Electronically Signed   By: Charline Bills M.D.   On: 12/04/2022 22:53     Past medical hx Past Medical History:  Diagnosis Date   Anxiety    Arthritis    Atrial fibrillation [facility])    s/p PVI at [facility] by Dr Sampson Goon in 2009   Concussion 12/2014   GERD (gastroesophageal reflux disease)    Headache    History of kidney stones    History of TIA (transient ischemic attack)    hx of 2   Migraine    hx of   PFO (patent foramen ovale)    RLS (restless legs syndrome)    SDH (subdural hematoma) (HCC)    s/p right parietal burr hole for subacute SDH evacuation 07/20/14   Sleep apnea    mild-no cpap needed   TIA (transient ischemic attack)      Social History   Tobacco Use   Smoking status: Never    Passive exposure: Past   Smokeless  tobacco: Never  Vaping Use   Vaping status: Never Used  Substance Use Topics   Alcohol use: Yes    Comment: rare   Drug use: No    Mr.Albany reports that he has never smoked. He has been exposed to tobacco smoke. He has never used smokeless tobacco. He reports current alcohol use. He reports that he does not use drugs.  Tobacco Cessation: Never smoker    Past surgical hx, Family hx, Social hx all reviewed.  Current Outpatient Medications on File Prior to Visit  Medication Sig   atorvastatin (LIPITOR) 10 MG tablet Take 10 mg by mouth daily.   Cholecalciferol (DIALYVITE VITAMIN D 5000) 125 MCG (5000 UT) capsule Take 1,500 Units by mouth every Saturday.   clonazePAM (KLONOPIN) 1 MG tablet Take 1 mg by mouth at bedtime.   clopidogrel (PLAVIX) 75 MG tablet Take 75 mg by mouth daily.   Ferrous Sulfate (IRON) 325 (65 FE) MG TABS Take 325 mg by mouth daily.    fluticasone (FLONASE) 50 MCG/ACT nasal spray Place 1 spray into both nostrils daily as needed for allergies.   latanoprost (XALATAN) 0.005 % ophthalmic solution Place 1 drop into both eyes daily.   loratadine (CLARITIN) 10 MG tablet Take 10 mg by mouth daily.   MAGNESIUM PO Take 1 tablet by mouth daily.   Multiple Vitamin (MULTIVITAMIN) tablet Take 1 tablet by mouth daily.   traZODone (DESYREL) 50 MG tablet Take 25 mg by mouth at bedtime.   No current facility-administered medications on file prior to visit.     No Known Allergies  Review Of Systems:  Constitutional:   +  weight loss, No night sweats,  Fevers, chills, fatigue, or  lassitude.  HEENT:   No headaches,  Difficulty swallowing,  Tooth/dental problems, or  Sore throat,                No sneezing, itching, ear ache, nasal congestion, post nasal drip,   CV:  No chest pain,  Orthopnea, PND, swelling in lower extremities, anasarca, dizziness, palpitations, syncope.   GI  No heartburn, indigestion, abdominal pain, +nausea, No vomiting, diarrhea, change in bowel  habits, loss of appetite, bloody stools.   Resp: + shortness of breath with exertion or at rest.  No excess mucus, no productive cough,  + non-productive cough,  No coughing up of blood.  No change in color of mucus.  + occasional wheezing while supine ,   No  chest wall deformity  Skin: no rash or lesions.Scar to face where patient has excision of low grade melanoma  GU: no dysuria, change in color of urine, no urgency or frequency.  No flank pain, no hematuria   MS:  No joint pain or swelling.  No decreased range of motion.  No back pain.  Psych:  No change in mood or affect. No depression , + anxiety.  No memory loss.   Vital Signs BP (!) 140/66 (BP Location: Right Arm, Cuff Size: Normal)   Pulse 88   Temp 97.7 F (36.5 C) (Oral)   Ht 6\' 1"  (1.854 m)   Wt 188 lb (85.3 kg)   SpO2 99%   BMI 24.80 kg/m    Physical Exam:  General- No distress,  A&Ox3, pleasant ENT: No sinus tenderness, TM clear, pale nasal mucosa, no oral exudate,no post nasal drip, no LAN Cardiac: S1, S2, regular rate and rhythm, no murmur Chest: No wheeze/ rales/ dullness; no accessory muscle use, no nasal flaring, no sternal retractions Abd.: Soft Non-tender, ND, BS +, Body mass index is 24.8 kg/m.  Ext: No clubbing cyanosis, edema Neuro:  normal strength,  MAE x 4, A&O x 3, appropriate  Skin: No rashes, warm and dry, no lesions  Psych: normal mood and behavior, appropriately anxious    Assessment/Plan Never smoker with 3.9 cm irregular spiculated mass in the left upper lobe Numerous calcified lymph nodes throughout the mediastinum and left hilum Widespread/diffuse osseous metastases per PET scan and Bone Scan  Plan Lung Mass Left upper lobe mass with speculated appearance on imaging, concerning for malignancy. Also noted weight loss and chronic cough. No significant smoking history. -Schedule bronchoscopy for biopsy under general anesthesia. -Biopsy is scheduled for 12/23/2022 with Dr. Aundria Rud at [facility] -We have discussed the risks of infection, bleeding, pneumothorax, and adverse effects of anesthesia. We will need to hold  your plavix for 5 days before your procedure.  -Stop Plavix 11/21 -You need someone to drive you to the procedure, stay while its being done, and drive you   home, and stay with you for 24 hours.  -Super DCT for navigational bronchoscopy preparation is scheduled for 12/18/2022 at [facility] -Thoracic and Lumbar Spine CT's will be done at the same time for bone biopsy.   Bone Lesions Detected on PET scan, potentially metastatic if lung mass is confirmed malignant. -Consider biopsy if feasible during bronchoscopy procedure. -Plan is for concurrent lung can bone biopsy for single anesthetic event and one time hold of Plavix therapy.  Chronic Cough Persistent despite treatment for presumed pneumonia. -Try over-the-counter cough suppressants such as Delsom or Robitussin. -let us know if you would like something stronger which will likely make you sleepy.  Nausea and Decreased Appetite Ongoing for several months, contributing to weight loss. -Consider Zofran sublingual for nausea control. -Encourage protein shakes like Ensure for nutritional support.  Anticoagulation On Plavix for history of TIAs and patent foramen ovale. -Hold Plavix for 5 days prior to bronchoscopy procedure. -Hold starting 12/18/2022  Follow-up -Return to clinic 1 week post-biopsy for results and further management planning. -Consider referral to medical oncology, radiation oncology, or surgery based on biopsy results. -Consider genetic and molecular marker testing of biopsied material  I spent 90 minutes dedicated to the care of this patient on the date of this encounter to include pre-visit review of records, face-to-face time with the patient discussing conditions above, post visit ordering of testing, clinical documentation with the electronic health  record, making  appropriate referrals as documented, and communicating necessary information to the patient's healthcare team.    Bevelyn Ngo, NP 12/17/2022  3:40 PM

## 2022-12-17 ENCOUNTER — Encounter: Payer: Self-pay | Admitting: Acute Care

## 2022-12-17 ENCOUNTER — Other Ambulatory Visit: Payer: Self-pay | Admitting: Emergency Medicine

## 2022-12-17 ENCOUNTER — Ambulatory Visit: Payer: Medicare Other | Admitting: Acute Care

## 2022-12-17 VITALS — BP 140/66 | HR 88 | Temp 97.7°F | Ht 73.0 in | Wt 188.0 lb

## 2022-12-17 DIAGNOSIS — G4733 Obstructive sleep apnea (adult) (pediatric): Secondary | ICD-10-CM | POA: Diagnosis not present

## 2022-12-17 DIAGNOSIS — R911 Solitary pulmonary nodule: Secondary | ICD-10-CM

## 2022-12-17 DIAGNOSIS — R110 Nausea: Secondary | ICD-10-CM

## 2022-12-17 DIAGNOSIS — Z85820 Personal history of malignant melanoma of skin: Secondary | ICD-10-CM

## 2022-12-17 DIAGNOSIS — M899 Disorder of bone, unspecified: Secondary | ICD-10-CM

## 2022-12-17 DIAGNOSIS — R059 Cough, unspecified: Secondary | ICD-10-CM

## 2022-12-17 DIAGNOSIS — Z9889 Other specified postprocedural states: Secondary | ICD-10-CM

## 2022-12-17 NOTE — Patient Instructions (Addendum)
It is good to see you today Plan is for a robotic bronchoscopy with biopsy of the left upper lobe nodule. I will call you as soon as I get you scheduled.  We have discussed the risks of infection, bleeding, pneumothorax, adverse effects of anesthesia. We will need to hold  your plavix for 5 days before your procedure.  You need someone to drive you to the procedure, stay while its being done, and drive you home, and stay with you for 24 hours.  Follow up with me 1 week after the procedure. I will check with Dr. Wyman Songster regarding possibility of getting biopsy of one of the bone lesions at the same time.  Call if you have ant questions. Please contact office for sooner follow up if symptoms do not improve or worsen or seek emergency care

## 2022-12-18 ENCOUNTER — Telehealth: Payer: Self-pay | Admitting: Student

## 2022-12-18 ENCOUNTER — Other Ambulatory Visit: Payer: Self-pay

## 2022-12-18 ENCOUNTER — Inpatient Hospital Stay: Payer: Medicare Other | Attending: Internal Medicine

## 2022-12-18 ENCOUNTER — Other Ambulatory Visit: Payer: Self-pay | Admitting: Acute Care

## 2022-12-18 ENCOUNTER — Other Ambulatory Visit: Payer: Self-pay | Admitting: Medical Oncology

## 2022-12-18 ENCOUNTER — Encounter (HOSPITAL_COMMUNITY): Payer: Self-pay | Admitting: Student in an Organized Health Care Education/Training Program

## 2022-12-18 ENCOUNTER — Ambulatory Visit (HOSPITAL_COMMUNITY)
Admission: RE | Admit: 2022-12-18 | Discharge: 2022-12-18 | Disposition: A | Discharge status: Home / Self Care | Payer: Medicare Other | Source: Ambulatory Visit | Attending: Acute Care | Admitting: Acute Care

## 2022-12-18 DIAGNOSIS — C7951 Secondary malignant neoplasm of bone: Secondary | ICD-10-CM | POA: Diagnosis not present

## 2022-12-18 DIAGNOSIS — C3412 Malignant neoplasm of upper lobe, left bronchus or lung: Secondary | ICD-10-CM | POA: Insufficient documentation

## 2022-12-18 DIAGNOSIS — R058 Other specified cough: Secondary | ICD-10-CM | POA: Diagnosis not present

## 2022-12-18 DIAGNOSIS — D649 Anemia, unspecified: Secondary | ICD-10-CM | POA: Insufficient documentation

## 2022-12-18 DIAGNOSIS — R918 Other nonspecific abnormal finding of lung field: Secondary | ICD-10-CM

## 2022-12-18 DIAGNOSIS — R911 Solitary pulmonary nodule: Secondary | ICD-10-CM

## 2022-12-18 LAB — CBC WITH DIFFERENTIAL/PLATELET
Abs Immature Granulocytes: 0.05 10*3/uL (ref 0.00–0.07)
Basophils Absolute: 0 10*3/uL (ref 0.0–0.1)
Basophils Relative: 0 %
Eosinophils Absolute: 0.2 10*3/uL (ref 0.0–0.5)
Eosinophils Relative: 3 %
HCT: 33.5 % — ABNORMAL LOW (ref 39.0–52.0)
Hemoglobin: 10.6 g/dL — ABNORMAL LOW (ref 13.0–17.0)
Immature Granulocytes: 1 %
Lymphocytes Relative: 6 %
Lymphs Abs: 0.4 10*3/uL — ABNORMAL LOW (ref 0.7–4.0)
MCH: 27.9 pg (ref 26.0–34.0)
MCHC: 31.6 g/dL (ref 30.0–36.0)
MCV: 88.2 fL (ref 80.0–100.0)
Monocytes Absolute: 0.5 10*3/uL (ref 0.1–1.0)
Monocytes Relative: 9 %
Neutro Abs: 4.6 10*3/uL (ref 1.7–7.7)
Neutrophils Relative %: 81 %
Platelets: 157 10*3/uL (ref 150–400)
RBC: 3.8 MIL/uL — ABNORMAL LOW (ref 4.22–5.81)
RDW: 18.3 % — ABNORMAL HIGH (ref 11.5–15.5)
WBC: 5.7 10*3/uL (ref 4.0–10.5)
nRBC: 0.5 % — ABNORMAL HIGH (ref 0.0–0.2)

## 2022-12-18 LAB — CMP (CANCER CENTER ONLY)
ALT: 31 U/L (ref 0–44)
AST: 30 U/L (ref 15–41)
Albumin: 3.9 g/dL (ref 3.5–5.0)
Alkaline Phosphatase: 376 U/L — ABNORMAL HIGH (ref 38–126)
Anion gap: 5 (ref 5–15)
BUN: 21 mg/dL (ref 8–23)
CO2: 26 mmol/L (ref 22–32)
Calcium: 9.1 mg/dL (ref 8.9–10.3)
Chloride: 109 mmol/L (ref 98–111)
Creatinine: 1.06 mg/dL (ref 0.61–1.24)
GFR, Estimated: >60 mL/min (ref >60)
Glucose, Bld: 102 mg/dL — ABNORMAL HIGH (ref 70–99)
Potassium: 5 mmol/L (ref 3.5–5.1)
Sodium: 140 mmol/L (ref 135–145)
Total Bilirubin: 0.8 mg/dL (ref <1.2)
Total Protein: 6.6 g/dL (ref 6.5–8.1)

## 2022-12-18 NOTE — Telephone Encounter (Signed)
Interventional Radiology Brief Note:  Dr. Corliss Skains contacted re: need for bone biopsy in patient with concern for lung carcinoma with + PET scan.  Bone biopsy arrangement made to accommodate need to hold [facility] for upcoming bronchoscopy.  IR received word this AM that bone biopsy no longer needed.  Procedure cancelled.  Please replace outpatient consult if biopsy desired in the future.   Loyce Dys, MS RD PA-C

## 2022-12-18 NOTE — Progress Notes (Signed)
SDW call  Patient was given pre-op instructions over the phone. Patient verbalized understanding of instructions provided.     PCP - Dr. Sigmund Hazel Cardiologist - Dr. Belva Crome EP Cardiology: Dr. Steffanie Dunn Pulmonary:    PPM/ICD - denies Device Orders - na Rep Notified - na   Chest x-ray - na EKG -  07/07/2022 Stress Test - ECHO -  Cardiac Cath -   Sleep Study/sleep apnea/CPAP: OSA, has a CPAP, not currently using it  Non-diabetic  Blood Thinner Instructions: Plavix, Cardiology instructed last dose 12/18/2022 Aspirin Instructions:denies   ERAS Protcol - NPOd   COVID TEST- na    Anesthesia review: Yes. Subdural hematoma, A-fib, TIA, patent foramen ovale, on plavix   Patient denies shortness of breath, fever, cough and chest pain over the phone call  Your procedure is scheduled on Tuesday, December 23, 2022  Report to [facility] Main Entrance "A" at  0630  A.M., then check in with the Admitting office.  Call this number if you have problems the morning of surgery:  [phone]   If you have any questions prior to your surgery date call [phone]: Open Monday-Friday 8am-4pm If you experience any cold or flu symptoms such as cough, fever, chills, shortness of breath, etc. between now and your scheduled surgery, please notify us at the above number    Remember:  Do not eat or drink after midnight the night before your surgery   Take these medicines if needed the morning of surgery with A SIP OF WATER:  Flonase  As of today, STOP taking any Aspirin (unless otherwise instructed by your surgeon) Aleve, Naproxen, Ibuprofen, Motrin, Advil, Goody's, BC's, all herbal medications, fish oil, and all vitamins.

## 2022-12-19 ENCOUNTER — Telehealth: Payer: Self-pay | Admitting: *Deleted

## 2022-12-19 LAB — PROSTATE-SPECIFIC AG, SERUM (LABCORP): Prostate Specific Ag, Serum: 2.2 ng/mL (ref 0.0–4.0)

## 2022-12-19 NOTE — Anesthesia Preprocedure Evaluation (Signed)
Anesthesia Evaluation  Patient identified by MRN, date of birth, ID band Patient awake    Reviewed: Allergy & Precautions, NPO status , Patient's Chart, lab work & pertinent test results  History of Anesthesia Complications Negative for: history of anesthetic complications  Airway Mallampati: III  TM Distance: >3 FB Neck ROM: Full    Dental no notable dental hx. (+) Dental Advisory Given   Pulmonary sleep apnea and Continuous Positive Airway Pressure Ventilation , pneumonia, resolved, neg recent URI   Pulmonary exam normal breath sounds clear to auscultation       Cardiovascular Normal cardiovascular exam+ dysrhythmias (s/p ablation 2009) Atrial Fibrillation  Rhythm:Regular Rate:Normal     Neuro/Psych  Headaches PSYCHIATRIC DISORDERS Anxiety     H/o SDH in 2016 TIA   GI/Hepatic Neg liver ROS,GERD  Controlled,,  Endo/Other  negative endocrine ROS    Renal/GU negative Renal ROS     Musculoskeletal  (+) Arthritis ,    Abdominal   Peds  Hematology negative hematology ROS (+)   Anesthesia Other Findings   Reproductive/Obstetrics                              Anesthesia Physical Anesthesia Plan  ASA: 3  Anesthesia Plan: General   Post-op Pain Management: Minimal or no pain anticipated   Induction: Intravenous  PONV Risk Score and Plan: 2 and Ondansetron, Dexamethasone, Treatment may vary due to age or medical condition, Propofol infusion and TIVA  Airway Management Planned: Oral ETT  Additional Equipment: None  Intra-op Plan:   Post-operative Plan: Extubation in OR  Informed Consent: I have reviewed the patients History and Physical, chart, labs and discussed the procedure including the risks, benefits and alternatives for the proposed anesthesia with the patient or authorized representative who has indicated his/her understanding and acceptance.     Dental advisory  given  Plan Discussed with: CRNA  Anesthesia Plan Comments: (PAT note by Antionette Poles, PA-C: 72 yo male with pertinent hx including afib (s/p ablation by Dr. Clydie Braun [facility] 2009; declined [facility] and prefers Plavix given SDH history), PFO, TIA (~ 1998 & 2000), GERD, OSA (reportedly mild, does not use CPAP), SDH (s/p right parietal burr hold with evacuation of subacute SDH 07/20/14).   Last seen by cardiologist Dr. Lalla Brothers on 07/07/2022.  Per note, doing well, remains very active, no recurrence of atrial fibrillation.  Recommended 1 year follow-up.  Patient is a never smoker with a recently discovered 3.9 cm irregular spiculated mass in left upper lobe with numerous calcified lymph nodes throughout the mediastinum and left hilum and widespread/diffuse osseous metastases per PET scan and bone scan.  Seen by pulmonology 12/17/2022 and bronchoscopy was recommended.  He was instructed to stop Plavix 12/18/2022.  EKG 07/07/22: NSR.  Rate 80.  LAD.  )         Anesthesia Quick Evaluation

## 2022-12-19 NOTE — Telephone Encounter (Signed)
PT is calling to see if blood work came in so he could move fwd with sched surgery next week. Pls call @ [phone]  States he needs to speak to her about BW because "there was a little bit of confusion yesterday"

## 2022-12-19 NOTE — Progress Notes (Signed)
Anesthesia Chart Review: Same day workup  72 yo male with pertinent hx including afib (s/p ablation by Dr. Clydie Braun [facility] 2009; declined [facility] and prefers Plavix given SDH history), PFO, TIA (~ 1998 & 2000), GERD, OSA (reportedly mild, does not use CPAP), SDH (s/p right parietal burr hold with evacuation of subacute SDH 07/20/14).   Last seen by cardiologist Dr. Lalla Brothers on 07/07/2022.  Per note, doing well, remains very active, no recurrence of atrial fibrillation.  Recommended 1 year follow-up.  Patient is a never smoker with a recently discovered 3.9 cm irregular spiculated mass in left upper lobe with numerous calcified lymph nodes throughout the mediastinum and left hilum and widespread/diffuse osseous metastases per PET scan and bone scan.  Seen by pulmonology 12/17/2022 and bronchoscopy was recommended.  He was instructed to stop Plavix 12/18/2022.  EKG 07/07/22: NSR.  Rate 80.  LAD.    Zannie Cove [facility]/Anesthesiology Phone [phone] 12/19/2022 12:37 PM

## 2022-12-22 NOTE — Telephone Encounter (Signed)
Returned call and spoke with patient. Reviewed note with patient. Patient verbalized understanding. Pt to complete bronch tomorrow 11/26.

## 2022-12-22 NOTE — Progress Notes (Signed)
The proposed treatment discussed in conference is for discussion purpose only and is not a binding recommendation.  The patients have not been physically examined, or presented with their treatment options.  Therefore, final treatment plans cannot be decided.  

## 2022-12-23 ENCOUNTER — Ambulatory Visit (HOSPITAL_COMMUNITY): Payer: Medicare Other

## 2022-12-23 ENCOUNTER — Ambulatory Visit (HOSPITAL_COMMUNITY): Payer: Medicare Other | Admitting: Physician Assistant

## 2022-12-23 ENCOUNTER — Encounter (HOSPITAL_COMMUNITY)
Admission: RE | Disposition: A | Discharge status: Home / Self Care | Payer: Self-pay | Source: Home / Self Care | Attending: Student in an Organized Health Care Education/Training Program

## 2022-12-23 ENCOUNTER — Encounter (HOSPITAL_COMMUNITY): Payer: Self-pay | Admitting: Student in an Organized Health Care Education/Training Program

## 2022-12-23 ENCOUNTER — Ambulatory Visit (HOSPITAL_COMMUNITY)
Admission: RE | Admit: 2022-12-23 | Discharge: 2022-12-23 | Disposition: A | Discharge status: Home / Self Care | Payer: Medicare Other | Attending: Student in an Organized Health Care Education/Training Program | Admitting: Student in an Organized Health Care Education/Training Program

## 2022-12-23 ENCOUNTER — Other Ambulatory Visit: Payer: Self-pay

## 2022-12-23 DIAGNOSIS — R911 Solitary pulmonary nodule: Secondary | ICD-10-CM | POA: Diagnosis not present

## 2022-12-23 DIAGNOSIS — C7951 Secondary malignant neoplasm of bone: Secondary | ICD-10-CM | POA: Diagnosis not present

## 2022-12-23 DIAGNOSIS — C3412 Malignant neoplasm of upper lobe, left bronchus or lung: Secondary | ICD-10-CM | POA: Insufficient documentation

## 2022-12-23 DIAGNOSIS — G4733 Obstructive sleep apnea (adult) (pediatric): Secondary | ICD-10-CM | POA: Diagnosis not present

## 2022-12-23 DIAGNOSIS — Z85820 Personal history of malignant melanoma of skin: Secondary | ICD-10-CM | POA: Insufficient documentation

## 2022-12-23 DIAGNOSIS — Z8673 Personal history of transient ischemic attack (TIA), and cerebral infarction without residual deficits: Secondary | ICD-10-CM | POA: Diagnosis not present

## 2022-12-23 DIAGNOSIS — R918 Other nonspecific abnormal finding of lung field: Secondary | ICD-10-CM | POA: Diagnosis not present

## 2022-12-23 DIAGNOSIS — Z7902 Long term (current) use of antithrombotics/antiplatelets: Secondary | ICD-10-CM | POA: Diagnosis not present

## 2022-12-23 DIAGNOSIS — K219 Gastro-esophageal reflux disease without esophagitis: Secondary | ICD-10-CM | POA: Diagnosis not present

## 2022-12-23 DIAGNOSIS — E785 Hyperlipidemia, unspecified: Secondary | ICD-10-CM | POA: Diagnosis not present

## 2022-12-23 DIAGNOSIS — G43909 Migraine, unspecified, not intractable, without status migrainosus: Secondary | ICD-10-CM | POA: Insufficient documentation

## 2022-12-23 DIAGNOSIS — Z85828 Personal history of other malignant neoplasm of skin: Secondary | ICD-10-CM | POA: Diagnosis not present

## 2022-12-23 HISTORY — PX: BRONCHIAL BIOPSY: SHX5109

## 2022-12-23 HISTORY — PX: BRONCHIAL WASHINGS: SHX5105

## 2022-12-23 HISTORY — PX: VIDEO BRONCHOSCOPY WITH RADIAL ENDOBRONCHIAL ULTRASOUND: SHX6849

## 2022-12-23 HISTORY — PX: BRONCHIAL BRUSHINGS: SHX5108

## 2022-12-23 HISTORY — PX: BRONCHIAL NEEDLE ASPIRATION BIOPSY: SHX5106

## 2022-12-23 SURGERY — BRONCHOSCOPY, WITH BIOPSY USING ELECTROMAGNETIC NAVIGATION
Anesthesia: General

## 2022-12-23 MED ORDER — ROCURONIUM BROMIDE 10 MG/ML (PF) SYRINGE
PREFILLED_SYRINGE | INTRAVENOUS | Status: DC | PRN
  Administered 2022-12-23: 5 mg via INTRAVENOUS
  Administered 2022-12-23: 50 mg via INTRAVENOUS
  Administered 2022-12-23: 5 mg via INTRAVENOUS

## 2022-12-23 MED ORDER — PHENYLEPHRINE HCL-NACL 20-0.9 MG/250ML-% IV SOLN
INTRAVENOUS | Status: DC | PRN
  Administered 2022-12-23: 20 ug/min via INTRAVENOUS

## 2022-12-23 MED ORDER — LACTATED RINGERS IV SOLN: INTRAVENOUS | Status: DC | PRN

## 2022-12-23 MED ORDER — PHENYLEPHRINE 80 MCG/ML (10ML) SYRINGE FOR IV PUSH (FOR BLOOD PRESSURE SUPPORT)
PREFILLED_SYRINGE | INTRAVENOUS | Status: DC | PRN
  Administered 2022-12-23 (×3): 120 ug via INTRAVENOUS
  Administered 2022-12-23: 160 ug via INTRAVENOUS

## 2022-12-23 MED ORDER — LIDOCAINE 2% (20 MG/ML) 5 ML SYRINGE
INTRAMUSCULAR | Status: DC | PRN
  Administered 2022-12-23: 60 mg via INTRAVENOUS

## 2022-12-23 MED ORDER — CHLORHEXIDINE GLUCONATE 0.12 % MT SOLN
15.0000 mL | Freq: Once | OROMUCOSAL | Status: AC
  Administered 2022-12-23: 15 mL via OROMUCOSAL
  Filled 2022-12-23: qty 15

## 2022-12-23 MED ORDER — DEXAMETHASONE SODIUM PHOSPHATE 10 MG/ML IJ SOLN
INTRAMUSCULAR | Status: DC | PRN
  Administered 2022-12-23: 10 mg via INTRAVENOUS

## 2022-12-23 MED ORDER — SODIUM CHLORIDE 0.9 % IV SOLN
INTRAVENOUS | Status: DC
Start: 2022-12-23 — End: 2022-12-23

## 2022-12-23 MED ORDER — SUGAMMADEX SODIUM 200 MG/2ML IV SOLN
INTRAVENOUS | Status: DC | PRN
  Administered 2022-12-23: 200 mg via INTRAVENOUS

## 2022-12-23 MED ORDER — PROPOFOL 10 MG/ML IV BOLUS
INTRAVENOUS | Status: DC | PRN
  Administered 2022-12-23: 90 mg via INTRAVENOUS

## 2022-12-23 MED ORDER — PROPOFOL 500 MG/50ML IV EMUL
INTRAVENOUS | Status: DC | PRN
  Administered 2022-12-23: 200 ug/kg/min via INTRAVENOUS

## 2022-12-23 MED ORDER — ONDANSETRON HCL 4 MG/2ML IJ SOLN
INTRAMUSCULAR | Status: DC | PRN
  Administered 2022-12-23: 4 mg via INTRAVENOUS

## 2022-12-23 NOTE — Anesthesia Postprocedure Evaluation (Signed)
Anesthesia Post Note  Patient: Gregg Guerrero  Procedure(s) Performed: ROBOTIC ASSISTED NAVIGATIONAL BRONCHOSCOPY BRONCHIAL BIOPSIES BRONCHIAL NEEDLE ASPIRATION BIOPSIES BRONCHIAL BRUSHINGS VIDEO BRONCHOSCOPY WITH RADIAL ENDOBRONCHIAL ULTRASOUND BRONCHIAL WASHINGS     Patient location during evaluation: PACU Anesthesia Type: General Level of consciousness: sedated and patient cooperative Pain management: pain level controlled Vital Signs Assessment: post-procedure vital signs reviewed and stable Respiratory status: spontaneous breathing Cardiovascular status: stable Anesthetic complications: no   No notable events documented.  Last Vitals:  Vitals:   12/23/22 1245 12/23/22 1300  BP: 95/71 104/60  Pulse: 77 76  Resp: 19 16  Temp:  36.5 C  SpO2: 93% 92%    Last Pain:  Vitals:   12/23/22 1245  TempSrc:   PainSc: 0-No pain                 Lewie Loron

## 2022-12-23 NOTE — Interval H&P Note (Signed)
S: Patient is in his usual state of health. He reports a dry cough. Denies chest pain.  O: Vitals:   12/23/22 0711  BP: (!) 110/45  Pulse: 83  Resp: 18  Temp: 97.8 F (36.6 C)  SpO2: 96%   General: Well-appearing and in no distress. Well-nourished Eyes: Anicteric, no conjunctival pallor HEENT: Mucous membranes moist, no evidence of postnasal drip Lymphadenopathy: No cervical or supraclavicular adenopathy Respiratory: Trachea is midline, no respiratory distress, good bilateral air entry, no wheezes, rales, or rhonchi Cardiovascular: Heart with regular rate and rhythm, normal S1 and S2, no murmurs, rubs, or gallops Neuro: Alert and oriented, no gross focal deficits  A/P: Patient is a pleasant 72 year old male presenting for the evaluation of a LUL pulmonary nodule with associated interlobular septal thickening and calcified hilar and mediastinal lymphadenopathy. PET/CT showed FDG avidity in the nodule, lymph nodes, as well as in bone. My differential for his presentation includes malignancy with metastasis to bone, malignancy with a para-neoplastic syndrome, erdheim chester disease, pagett's disease, and sarcoidosis. Will proceed with robotic assisted navigational bronchoscopy to the LUL nodule with EBUS/TBNA to the hilar and mediastinal lymph nodes. Patient was on Plavix and has held it since last Thursday (6 days). Patient is appropriate for the procedure. Risks were explained, including bleed and pneumothorax. Patient is appropriate for the procedure. All the questions were answered.  Raechel Chute, MD [facility] 12/23/2022 9:59 AM

## 2022-12-23 NOTE — Op Note (Signed)
Video Bronchoscopy with Robotic Assisted Bronchoscopic Navigation   Date of Operation: 12/23/2022   Pre-op Diagnosis: LUL Nodule  Post-op Diagnosis: LUL Nodule  Surgeon: Dgayli/Tevis Dunavan  Anesthesia: General endotracheal anesthesia  Operation: Flexible video fiberoptic bronchoscopy with robotic assistance and biopsies.  Estimated Blood Loss: Minimal  Complications: None  Indications and History: Gregg Guerrero is a 72 y.o. male with history of LUL nodule. The risks, benefits, complications, treatment options and expected outcomes were discussed with the patient.  The possibilities of pneumothorax, pneumonia, reaction to medication, pulmonary aspiration, perforation of a viscus, bleeding, failure to diagnose a condition and creating a complication requiring transfusion or operation were discussed with the patient who freely signed the consent.    Description of Procedure: The patient was seen in the Preoperative Area, was examined and was deemed appropriate to proceed.  The patient was taken to [facility] endoscopy room 3, identified as [name] and the procedure verified as Flexible Video Fiberoptic Bronchoscopy.  A Time Out was held and the above information confirmed.   Prior to the date of the procedure a high-resolution CT scan of the chest was performed. Utilizing ION software program a virtual tracheobronchial tree was generated to allow the creation of distinct navigation pathways to the patient's parenchymal abnormalities. After being taken to the operating room general anesthesia was initiated and the patient  was orally intubated. The video fiberoptic bronchoscope was introduced via the endotracheal tube and a general inspection was performed which showed normal right and left lung anatomy, aspiration of the bilateral mainstems was completed to remove any remaining secretions. Robotic catheter inserted into patient's endotracheal tube.   Target #1 LUL: The distinct navigation  pathways prepared prior to this procedure were then utilized to navigate to patient's lesion identified on CT scan. The robotic catheter was secured into place and the vision probe was withdrawn.  Lesion location was approximated using 3D fluoroscopy with Ciospin and radial endobronchial ultrasound for peripheral targeting. Under fluoroscopic guidance transbronchial needle brushings, transbronchial needle biopsies, and transbronchial forceps biopsies were performed to be sent for cytology and pathology.   We then introduced the EBUS scope and station 4R, 7, 4L and 11L were evaluated however unable to visualize.   At the end of the procedure a general airway inspection was performed and there was no evidence of active bleeding. We then proceed with a LUL BAL. The bronchoscope was removed.  The patient tolerated the procedure well. There was no significant blood loss and there were no obvious complications. A post-procedural chest x-ray is pending.  Samples Target #1: 1. Transbronchial needle brushings from LUL 2. Transbronchial needle biopsies from LUL 3. Transbronchial forceps biopsies from LUL 4. Bronchoalveolar lavage from LUL  Plans:  The patient will be discharged from the PACU to home when recovered from anesthesia and after chest x-ray is reviewed. We will review the cytology, pathology and microbiology results with the patient when they become available. Outpatient followup will be with DrAundria Rud.    Janann Colonel, MD [facility] 12/23/2022 12:02 PM

## 2022-12-23 NOTE — Anesthesia Procedure Notes (Signed)
Procedure Name: Intubation Date/Time: 12/23/2022 10:38 AM  Performed by: Darlina Guys, CRNAPre-anesthesia Checklist: Patient identified, Emergency Drugs available, Suction available and Patient being monitored Patient Re-evaluated:Patient Re-evaluated prior to induction Oxygen Delivery Method: Circle System Utilized Preoxygenation: Pre-oxygenation with 100% oxygen Induction Type: IV induction Ventilation: Mask ventilation without difficulty Laryngoscope Size: Mac and 4 Grade View: Grade I Tube type: Oral Number of attempts: 1 Airway Equipment and Method: Stylet and Oral airway Placement Confirmation: ETT inserted through vocal cords under direct vision, positive ETCO2 and breath sounds checked- equal and bilateral Secured at: 23 cm Tube secured with: Tape Dental Injury: Teeth and Oropharynx as per pre-operative assessment  Comments: Atraumatic intubation. Dentition and oral mucosa as per preop

## 2022-12-23 NOTE — Transfer of Care (Signed)
Immediate Anesthesia Transfer of Care Note  Patient: Gregg Guerrero  Procedure(s) Performed: ROBOTIC ASSISTED NAVIGATIONAL BRONCHOSCOPY BRONCHIAL BIOPSIES BRONCHIAL NEEDLE ASPIRATION BIOPSIES BRONCHIAL BRUSHINGS VIDEO BRONCHOSCOPY WITH RADIAL ENDOBRONCHIAL ULTRASOUND BRONCHIAL WASHINGS  Patient Location: PACU  Anesthesia Type:General  Level of Consciousness: drowsy  Airway & Oxygen Therapy: Patient Spontanous Breathing and Patient connected to face mask oxygen  Post-op Assessment: Report given to RN and Post -op Vital signs reviewed and stable  Post vital signs: Reviewed and stable  Last Vitals:  Vitals Value Taken Time  BP 115/57 12/23/22 1215  Temp    Pulse 80 12/23/22 1221  Resp 18 12/23/22 1221  SpO2 95 % 12/23/22 1221  Vitals shown include unfiled device data.  Last Pain:  Vitals:   12/23/22 0727  TempSrc:   PainSc: 0-No pain         Complications: No notable events documented.

## 2022-12-24 ENCOUNTER — Other Ambulatory Visit (HOSPITAL_COMMUNITY): Payer: Self-pay | Admitting: Student in an Organized Health Care Education/Training Program

## 2022-12-24 DIAGNOSIS — C3492 Malignant neoplasm of unspecified part of left bronchus or lung: Secondary | ICD-10-CM

## 2022-12-24 LAB — CYTOLOGY - NON PAP

## 2022-12-24 NOTE — Telephone Encounter (Signed)
NFN 

## 2022-12-25 LAB — CULTURE, BAL-QUANTITATIVE W GRAM STAIN

## 2022-12-25 LAB — CULTURE, RESPIRATORY W GRAM STAIN

## 2022-12-26 ENCOUNTER — Encounter (HOSPITAL_COMMUNITY): Payer: Self-pay | Admitting: Student in an Organized Health Care Education/Training Program

## 2022-12-28 LAB — AEROBIC/ANAEROBIC CULTURE W GRAM STAIN (SURGICAL/DEEP WOUND)
Culture: NO GROWTH
Gram Stain: NONE SEEN
Gram Stain: NONE SEEN

## 2022-12-28 LAB — ACID FAST SMEAR (AFB, MYCOBACTERIA): Acid Fast Smear: NEGATIVE

## 2022-12-29 ENCOUNTER — Telehealth: Payer: Self-pay | Admitting: Acute Care

## 2022-12-29 LAB — ACID FAST SMEAR (AFB, MYCOBACTERIA): Acid Fast Smear: NEGATIVE

## 2022-12-29 NOTE — Telephone Encounter (Signed)
Patient would like to speak with Maralyn Sago NP. He has some questions about some upcoming appointment. [phone]

## 2022-12-30 ENCOUNTER — Inpatient Hospital Stay: Payer: Medicare Other

## 2022-12-30 ENCOUNTER — Inpatient Hospital Stay: Payer: Medicare Other | Attending: Oncology | Admitting: Oncology

## 2022-12-30 ENCOUNTER — Encounter: Payer: Self-pay | Admitting: Oncology

## 2022-12-30 ENCOUNTER — Encounter: Payer: Self-pay | Admitting: *Deleted

## 2022-12-30 ENCOUNTER — Other Ambulatory Visit: Payer: Self-pay | Admitting: *Deleted

## 2022-12-30 VITALS — BP 134/61 | HR 88 | Temp 97.8°F | Resp 18 | Ht 73.0 in | Wt 188.4 lb

## 2022-12-30 DIAGNOSIS — R110 Nausea: Secondary | ICD-10-CM | POA: Diagnosis not present

## 2022-12-30 DIAGNOSIS — D038 Melanoma in situ of other sites: Secondary | ICD-10-CM

## 2022-12-30 DIAGNOSIS — D649 Anemia, unspecified: Secondary | ICD-10-CM | POA: Diagnosis not present

## 2022-12-30 DIAGNOSIS — D0339 Melanoma in situ of other parts of face: Secondary | ICD-10-CM | POA: Insufficient documentation

## 2022-12-30 DIAGNOSIS — Z79899 Other long term (current) drug therapy: Secondary | ICD-10-CM | POA: Insufficient documentation

## 2022-12-30 DIAGNOSIS — R058 Other specified cough: Secondary | ICD-10-CM | POA: Diagnosis not present

## 2022-12-30 DIAGNOSIS — C3412 Malignant neoplasm of upper lobe, left bronchus or lung: Secondary | ICD-10-CM

## 2022-12-30 DIAGNOSIS — R748 Abnormal levels of other serum enzymes: Secondary | ICD-10-CM | POA: Diagnosis not present

## 2022-12-30 DIAGNOSIS — Z7722 Contact with and (suspected) exposure to environmental tobacco smoke (acute) (chronic): Secondary | ICD-10-CM

## 2022-12-30 DIAGNOSIS — Z85828 Personal history of other malignant neoplasm of skin: Secondary | ICD-10-CM

## 2022-12-30 DIAGNOSIS — C7951 Secondary malignant neoplasm of bone: Secondary | ICD-10-CM

## 2022-12-30 DIAGNOSIS — I4891 Unspecified atrial fibrillation: Secondary | ICD-10-CM | POA: Insufficient documentation

## 2022-12-30 LAB — CBC WITH DIFFERENTIAL/PLATELET
Abs Immature Granulocytes: 0.38 10*3/uL — ABNORMAL HIGH (ref 0.00–0.07)
Basophils Absolute: 0 10*3/uL (ref 0.0–0.1)
Basophils Relative: 0 %
Eosinophils Absolute: 0.2 10*3/uL (ref 0.0–0.5)
Eosinophils Relative: 3 %
HCT: 36 % — ABNORMAL LOW (ref 39.0–52.0)
Hemoglobin: 11.1 g/dL — ABNORMAL LOW (ref 13.0–17.0)
Immature Granulocytes: 6 %
Lymphocytes Relative: 6 %
Lymphs Abs: 0.4 10*3/uL — ABNORMAL LOW (ref 0.7–4.0)
MCH: 27.1 pg (ref 26.0–34.0)
MCHC: 30.8 g/dL (ref 30.0–36.0)
MCV: 88 fL (ref 80.0–100.0)
Monocytes Absolute: 0.5 10*3/uL (ref 0.1–1.0)
Monocytes Relative: 7 %
Neutro Abs: 5.3 10*3/uL (ref 1.7–7.7)
Neutrophils Relative %: 78 %
Platelets: 187 10*3/uL (ref 150–400)
RBC: 4.09 MIL/uL — ABNORMAL LOW (ref 4.22–5.81)
RDW: 19.4 % — ABNORMAL HIGH (ref 11.5–15.5)
WBC: 6.8 10*3/uL (ref 4.0–10.5)
nRBC: 0 % (ref 0.0–0.2)

## 2022-12-30 LAB — COMPREHENSIVE METABOLIC PANEL
ALT: 55 U/L — ABNORMAL HIGH (ref 0–44)
AST: 36 U/L (ref 15–41)
Albumin: 4.1 g/dL (ref 3.5–5.0)
Alkaline Phosphatase: 347 U/L — ABNORMAL HIGH (ref 38–126)
Anion gap: 5 (ref 5–15)
BUN: 19 mg/dL (ref 8–23)
CO2: 26 mmol/L (ref 22–32)
Calcium: 8.9 mg/dL (ref 8.9–10.3)
Chloride: 109 mmol/L (ref 98–111)
Creatinine, Ser: 1.08 mg/dL (ref 0.61–1.24)
GFR, Estimated: >60 mL/min (ref >60)
Glucose, Bld: 94 mg/dL (ref 70–99)
Potassium: 5.4 mmol/L — ABNORMAL HIGH (ref 3.5–5.1)
Sodium: 140 mmol/L (ref 135–145)
Total Bilirubin: 0.9 mg/dL (ref <1.2)
Total Protein: 6.5 g/dL (ref 6.5–8.1)

## 2022-12-30 LAB — LACTATE DEHYDROGENASE: LDH: 308 U/L — ABNORMAL HIGH (ref 98–192)

## 2022-12-30 LAB — IRON AND IRON BINDING CAPACITY (CC-WL,HP ONLY)
Iron: 102 ug/dL (ref 45–182)
Saturation Ratios: 38 % (ref 17.9–39.5)
TIBC: 267 ug/dL (ref 250–450)
UIBC: 165 ug/dL

## 2022-12-30 LAB — VITAMIN B12: Vitamin B-12: 2055 pg/mL — ABNORMAL HIGH (ref 180–914)

## 2022-12-30 LAB — FERRITIN: Ferritin: 852 ng/mL — ABNORMAL HIGH (ref 24–336)

## 2022-12-30 LAB — FOLATE: Folate: 12 ng/mL (ref >5.9)

## 2022-12-30 LAB — TSH: TSH: 1.467 u[IU]/mL (ref 0.350–4.500)

## 2022-12-30 NOTE — Assessment & Plan Note (Signed)
Patient experiences a dry cough, particularly when talking. No pain associated with the cough. -Continue Tessalon pearls as needed for cough relief.

## 2022-12-30 NOTE — Assessment & Plan Note (Addendum)
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 No evidence of liver or other visceral involvement. Patient has a history of secondhand smoke exposure. Family history of various cancers. Patient is experiencing a dry cough and has lost about 10 pounds. No pain or coughing up blood.  -Today I discussed staging, diagnosis, prognosis, plan of care, treatment options with the patient and his wife.  Reviewed NCCN guidelines.  Discussed that all treatment options are palliative in nature and not curative intent, given stage IV disease.  They verbalized understanding.  -Ordered [facility] testing on the biopsy specimen to identify potential targetable mutations.  Also request submitted for [employer] (liquid biopsy) for any circulating tumor DNA/mutation analysis.  Will follow-up on these results.  -Since he was a non-smoker, chances of him having actionable mutations is higher.  In such a case we will plan for targeted therapy depending on the mutations.   -Plan for chemotherapy and immunotherapy if no targetable mutations are identified.  Discussed the regimen of carboplatin, pemetrexed and Keytruda, to be given once every 3 weeks for up to 4 times followed by restaging followed by maintenance treatments with Keytruda alone or in combination with pemetrexed.  -Ordered  MRI of the brain to rule out metastasis.  -Referral sent for placement of a Port-a-cath for IV treatments.  -Plan to see patient back in two weeks to discuss treatment plan based on test results.

## 2022-12-30 NOTE — Telephone Encounter (Signed)
Patient sent message to the front desk. He is wondering how necessary his appointment is on 12/4. I did respond to him stressing that this is a post-bronch followup and that he needed to attend it, but Im sure he would appreciate the confirmation from the lung nodule team as well.

## 2022-12-30 NOTE — Telephone Encounter (Signed)
Returned call. Spoke with patient. Reinforced importance of bronch f/u. Patient verbalized understanding and will keep appt for tomorrow.

## 2022-12-30 NOTE — Assessment & Plan Note (Signed)
-   Plan to start him on Xgeva eventually.  Will obtain dental clearance from his dentist prior to that

## 2022-12-30 NOTE — Progress Notes (Signed)
11/29: I called the pt to introduce myself and explain my role as a Statistician. I asked the pt if he would be willing to be seen at [facility] with Dr.Pasam, however the pt tells me he had just gotten off the phone with a scheduler about said appt, so he was set. I emailed a request to Rudean Curt, [facility] path tech to send the pt's tissue to [facility] for molecular testing.

## 2022-12-30 NOTE — Assessment & Plan Note (Signed)
Hemoglobin of 10.6 recently.  Patient has been taking iron supplements.  Hemoglobin slightly better at 11.1 today.  -Ferritin is elevated at 852 but that would be an acute phase reactant.  Iron studies, B12, folate are pending.  TSH is normal.  -Encourage patient to continue taking iron supplements for now.

## 2022-12-30 NOTE — Progress Notes (Signed)
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  Discussed the use of AI scribe software for clinical note transcription with the patient, who gave verbal consent to proceed.   I have reviewed his chart and materials related to his cancer extensively and collaborated history with the patient. Summary of oncologic history is as follows:  He has been under the care of his PCP for what was initially thought to be a pneumonia on CXR. Patient developed shortness of breath in September 2024 and was seen by his primary care physician.  At that time a chest x-ray was done which showed an infiltrate and question of atypical pneumonia.  The patient was treated with antibiotics however his symptoms did not improve.  A CT of the chest was ordered to further evaluate etiology for nonresolving dyspnea.  CT was done 11/11/2022 which showed a spiculated nodule in the lingula with diffuse interstitial thickening in the left upper lobe that was concerning for an aggressive process or malignancy.  Additionally there  were scattered sclerotic bone lesions diffusely along the skeleton, and numerous calcified lymph nodes throughout the mediastinum and left hilum.  Patient's PCP Dr. Hyacinth Meeker then ordered a PET scan in addition to a whole-body bone scan.The PET scan was notable for a 3.9 cm irregular spiculated mass in the left upper lobe, suspicious for bronchogenic carcinoma. 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 Whole body bone scan confirmed widespread osseous metastases. He denies any associated pain.    He was referred to [facility] for consideration of robotic assisted bronchoscopy with biopsies to further evaluate the left upper lobe lesion.   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 Per his wife the melanoma was low grade and did not require any further treatment after excision.   Patient had initial consultation in pulmonology clinic on 12/17/2022 and underwent navigational bronchoscopy, EBUS and biopsy of left upper lobe lung nodule on 12/23/2022, performed by Dr. Larinda Buttery.  On EBUS, hilar and mediastinal lymph node stations were examined but no lymph nodes were encountered.  This was suspected to be secondary to severe calcification noted on CT scan.  Pathology from left upper lobe lung nodule biopsy came back positive for adenocarcinoma.  Brushings from left upper lobe and lavage showed rare atypical cells.  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 Additionally there were scattered sclerotic bone lesions diffusely along the skeleton, and numerous calcified lymph nodes throughout the mediastinum and left hilum.    12/04/2022 PET scan   PET scan was notable for a 3.9 cm irregular spiculated mass in the left upper lobe, suspicious for bronchogenic carcinoma. 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  Whole body bone scan confirmed widespread osseous metastases. He denies any associated pain.    12/23/2022 Initial Diagnosis   Malignant neoplasm of upper lobe of left lung [facility]):  He has been under the care of his PCP for what was initially thought to be a pneumonia on CXR. Patient developed shortness of breath in September 2024 and was seen by his primary care physician.  At that time a chest x-ray was done which showed an infiltrate and question of atypical pneumonia.  The patient was treated with antibiotics however his symptoms did not improve.  A CT of the chest was ordered to further evaluate etiology for nonresolving dyspnea.   CT chest on 11/11/2022 which showed a spiculated nodule in the lingula with diffuse interstitial thickening in the left upper lobe that was concerning for an aggressive process or malignancy.  This led to PET/CT, pulmonology referral and additional evaluation.  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 Per his wife the melanoma was low grade and did not require any further treatment after excision.    12/23/2022 Procedure   Patient had initial consultation in pulmonology clinic on 12/17/2022 and  underwent navigational bronchoscopy, EBUS and biopsy of left upper lobe lung nodule on 12/23/2022, performed by Dr. Larinda Buttery.  On EBUS, hilar and mediastinal lymph node stations were examined but no lymph nodes were encountered.  This was suspected to be secondary to severe calcification noted on CT scan.   12/23/2022 Pathology Results   Pathology from left upper lobe lung nodule biopsy came back positive for adenocarcinoma.  Brushings from left upper lobe and lavage showed rare atypical cells.   12/30/2022 Cancer Staging   Staging form: Lung, AJCC 8th Edition - Clinical stage from 12/30/2022: Stage IVB (cT2a, cN0, cM1c) - Signed by Meryl Crutch, MD on 12/30/2022 Stage prefix: Initial diagnosis    Miscellaneous   Request submitted for Foundation Medicine testing on the specimen and Guardant 360 (liquid biopsy) for any actionable mutations and also for PD-L1 testing.  Treatment decisions to be made pending these results.     INTERVAL HISTORY:  He reports persistent dry cough, weight loss, and occasional nausea. The cough is particularly noticeable when the patient talks a lot, but does not cause any pain or produce any blood, except for during a bronchoscopy biopsy. The patient has lost about ten pounds recently and has been feeling nauseous before meals, but does not have any issues eating once he starts. The patient has been taking nausea medication every other day as needed. The patient has not experienced any bone pain, except  for a broken rib a few months ago. The patient has been taking iron supplements for low blood count. The patient has not had his COVID-19 booster shot yet.  MEDICAL HISTORY:  Past Medical History:  Diagnosis Date   Anxiety    Arthritis    Atrial fibrillation [facility])    s/p PVI at [facility] by Dr Sampson Goon in 2009   Concussion 12/2014   GERD (gastroesophageal reflux disease)    Headache    History of kidney stones    History of TIA (transient ischemic attack)    hx of  2   Migraine    hx of   PFO (patent foramen ovale)    RLS (restless legs syndrome)    SDH (subdural hematoma) (HCC)    s/p right parietal burr hole for subacute SDH evacuation 07/20/14   Sleep apnea    mild-no cpap needed   TIA (transient ischemic attack)     SURGICAL HISTORY: Past Surgical History:  Procedure Laterality Date   BRONCHIAL BIOPSY  12/23/2022   Procedure: BRONCHIAL BIOPSIES;  Surgeon: Raechel Chute, MD;  Location: [facility];  Service: Thoracic;;   BRONCHIAL BRUSHINGS  12/23/2022   Procedure: BRONCHIAL BRUSHINGS;  Surgeon: Raechel Chute, MD;  Location: [facility];  Service: Thoracic;;   BRONCHIAL NEEDLE ASPIRATION BIOPSY  12/23/2022   Procedure: BRONCHIAL NEEDLE ASPIRATION BIOPSIES;  Surgeon: Raechel Chute, MD;  Location: [facility];  Service: Thoracic;;   BRONCHIAL WASHINGS  12/23/2022   Procedure: BRONCHIAL WASHINGS;  Surgeon: Raechel Chute, MD;  Location: [facility];  Service: Thoracic;;   BURR HOLE Right 07/20/2014   Procedure: Ezekiel Ina For Evacuation of SDH;  Surgeon: Julio Sicks, MD;  Location: [facility] NEURO ORS;  Service: Neurosurgery;  Laterality: Right;   CARDIAC CATHETERIZATION     2000   CARDIAC ELECTROPHYSIOLOGY STUDY AND ABLATION  2009   PVI by Dr Sampson Goon at [facility]   CHEILECTOMY Left 02/03/2013   Procedure: LEFT HALLUX METATARSALPHALANGEAL JOINT CHEILECTOMY;  Surgeon: Toni Arthurs, MD;  Location: [facility];  Service: Orthopedics;  Laterality: Left;   COLONOSCOPY     EXTRACORPOREAL SHOCK WAVE LITHOTRIPSY Right 02/11/2016   Procedure: RIGHT EXTRACORPOREAL SHOCK WAVE LITHOTRIPSY (ESWL);  Surgeon: Bjorn Pippin, MD;  Location: [facility]S;  Service: Urology;  Laterality: Right;   FOOT ARTHROTOMY  2008   right foot    INGUINAL HERNIA REPAIR Right 05/16/2021   Procedure: OPEN RIGHT INGUINAL HERNIA REPAIR;  Surgeon: Griselda Miner, MD;  Location: [facility] OR;  Service: General;  Laterality: Right;   INSERTION OF MESH Right 05/16/2021   Procedure:  INSERTION OF MESH;  Surgeon: Griselda Miner, MD;  Location: [facility] OR;  Service: General;  Laterality: Right;   MEDIAL PARTIAL KNEE REPLACEMENT Left 2021   Dr. Lequita Halt   SHOULDER SURGERY Left 2012   [facility] (Dr. Rennis Chris)   TONSILLECTOMY     VIDEO BRONCHOSCOPY WITH RADIAL ENDOBRONCHIAL ULTRASOUND  12/23/2022   Procedure: VIDEO BRONCHOSCOPY WITH RADIAL ENDOBRONCHIAL ULTRASOUND;  Surgeon: Raechel Chute, MD;  Location: [facility];  Service: Thoracic;;    SOCIAL HISTORY: Social History   Socioeconomic History   Marital status: Married    Spouse name: Debbie   Number of children: 1   Years of education: College    Highest education level: Not on file  Occupational History    Employer: [employer]  Tobacco Use   Smoking status: Never    Passive exposure: Past   Smokeless tobacco: Never  Vaping Use  Vaping status: Never Used  Substance and Sexual Activity   Alcohol use: Yes    Comment: moderately   Drug use: No   Sexual activity: Not on file  Other Topics Concern   Not on file  Social History Narrative   Patient lives at home with wife.    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    Patient has a college education.    Patient has 1 child.    Patient quit caffeine use 2005.   Patient is right handed.       Attends [employer]   Social Determinants of Health   Financial Resource Strain: Not on file  Food Insecurity: No Food Insecurity (12/30/2022)   Hunger Vital Sign    Worried About Running Out of Food in the Last Year: Never true    Ran Out of Food in the Last Year: Never true  Transportation Needs: No Transportation Needs (12/30/2022)   PRAPARE - Administrator, Civil Service (Medical): No    Lack of Transportation (Non-Medical): No  Physical Activity: Not on file  Stress: Not on file  Social Connections: Not on file  Intimate Partner Violence: Not At Risk (12/30/2022)   Humiliation, Afraid, Rape, and Kick  questionnaire    Fear of Current or Ex-Partner: No    Emotionally Abused: No    Physically Abused: No    Sexually Abused: No    FAMILY HISTORY: Family History  Problem Relation Age of Onset   Congestive Heart Failure Father    Stroke Mother     ALLERGIES:  He has No Known Allergies.  MEDICATIONS:  Current Outpatient Medications  Medication Sig Dispense Refill   atorvastatin (LIPITOR) 10 MG tablet Take 10 mg by mouth at bedtime.     Cholecalciferol (DIALYVITE VITAMIN D 5000) 125 MCG (5000 UT) capsule Take 1,500 Units by mouth every Saturday.     clonazePAM (KLONOPIN) 1 MG tablet Take 1 mg by mouth at bedtime.     clopidogrel (PLAVIX) 75 MG tablet Take 75 mg by mouth daily.     Ferrous Sulfate (IRON) 325 (65 FE) MG TABS Take 325 mg by mouth daily.      fluticasone (FLONASE) 50 MCG/ACT nasal spray Place 1 spray into both nostrils daily as needed for allergies.     latanoprost (XALATAN) 0.005 % ophthalmic solution Place 1 drop into both eyes at bedtime.     loratadine (CLARITIN) 10 MG tablet Take 10 mg by mouth at bedtime.     MAGNESIUM PO Take 1 tablet by mouth daily.     Multiple Vitamin (MULTIVITAMIN) tablet Take 1 tablet by mouth daily.     traZODone (DESYREL) 50 MG tablet Take 25 mg by mouth at bedtime.     UNABLE TO FIND Take 30 mg by mouth daily. Med Name: Zinc Chelated     No current facility-administered medications for this visit.    REVIEW OF SYSTEMS:    Review of Systems  Respiratory:  Positive for cough.     All other pertinent systems were reviewed with the patient and are negative.  PHYSICAL EXAMINATION:  ECOG PERFORMANCE STATUS: 1 - Symptomatic but completely ambulatory  Vitals:   12/30/22 1117  BP: 134/61  Pulse: 88  Resp: 18  Temp: 97.8 F (36.6 C)  SpO2: 98%   Filed Weights   12/30/22 1117  Weight: 188 lb 6.4 oz (85.5 kg)    Physical Exam Constitutional:  General: He is not in acute distress.    Appearance: Normal appearance.  HENT:      Head: Normocephalic and atraumatic.  Eyes:     General: No scleral icterus.    Conjunctiva/sclera: Conjunctivae normal.  Cardiovascular:     Rate and Rhythm: Normal rate and regular rhythm.     Heart sounds: Normal heart sounds.  Pulmonary:     Effort: Pulmonary effort is normal.     Breath sounds: Normal breath sounds.  Abdominal:     General: There is no distension.  Musculoskeletal:     Right lower leg: No edema.     Left lower leg: No edema.  Lymphadenopathy:     Cervical: No cervical adenopathy.  Neurological:     General: No focal deficit present.     Mental Status: He is alert and oriented to person, place, and time.  Psychiatric:        Mood and Affect: Mood normal.        Behavior: Behavior normal.        Thought Content: Thought content normal.     LABORATORY DATA:   I have reviewed the data as listed.  Results for orders placed or performed in visit on 12/30/22  Ferritin  Result Value Ref Range   Ferritin 852 (H) 24 - 336 ng/mL  TSH  Result Value Ref Range   TSH 1.467 0.350 - 4.500 uIU/mL  Lactate dehydrogenase  Result Value Ref Range   LDH 308 (H) 98 - 192 U/L  Comprehensive metabolic panel  Result Value Ref Range   Sodium 140 135 - 145 mmol/L   Potassium 5.4 (H) 3.5 - 5.1 mmol/L   Chloride 109 98 - 111 mmol/L   CO2 26 22 - 32 mmol/L   Glucose, Bld 94 70 - 99 mg/dL   BUN 19 8 - 23 mg/dL   Creatinine, Ser 2.44 0.61 - 1.24 mg/dL   Calcium 8.9 8.9 - 01.0 mg/dL   Total Protein 6.5 6.5 - 8.1 g/dL   Albumin 4.1 3.5 - 5.0 g/dL   AST 36 15 - 41 U/L   ALT 55 (H) 0 - 44 U/L   Alkaline Phosphatase 347 (H) 38 - 126 U/L   Total Bilirubin 0.9 <1.2 mg/dL   GFR, Estimated >27 >25 mL/min   Anion gap 5 5 - 15  CBC with Differential/Platelet  Result Value Ref Range   WBC 6.8 4.0 - 10.5 K/uL   RBC 4.09 (L) 4.22 - 5.81 MIL/uL   Hemoglobin 11.1 (L) 13.0 - 17.0 g/dL   HCT 36.6 (L) 44.0 - 34.7 %   MCV 88.0 80.0 - 100.0 fL   MCH 27.1 26.0 - 34.0 pg   MCHC  30.8 30.0 - 36.0 g/dL   RDW 42.5 (H) 95.6 - 38.7 %   Platelets 187 150 - 400 K/uL   nRBC 0.0 0.0 - 0.2 %   Neutrophils Relative % 78 %   Neutro Abs 5.3 1.7 - 7.7 K/uL   Lymphocytes Relative 6 %   Lymphs Abs 0.4 (L) 0.7 - 4.0 K/uL   Monocytes Relative 7 %   Monocytes Absolute 0.5 0.1 - 1.0 K/uL   Eosinophils Relative 3 %   Eosinophils Absolute 0.2 0.0 - 0.5 K/uL   Basophils Relative 0 %   Basophils Absolute 0.0 0.0 - 0.1 K/uL   Immature Granulocytes 6 %   Abs Immature Granulocytes 0.38 (H) 0.00 - 0.07 K/uL     Recent Labs  12/18/22 1547 12/30/22 1320  NA 140 140  K 5.0 5.4*  CL 109 109  CO2 26 26  GLUCOSE 102* 94  BUN 21 19  CREATININE 1.06 1.08  CALCIUM 9.1 8.9  GFRNONAA >60 >60  PROT 6.6 6.5  ALBUMIN 3.9 4.1  AST 30 36  ALT 31 55*  ALKPHOS 376* 347*  BILITOT 0.8 0.9    RADIOGRAPHIC STUDIES:  I have personally reviewed the radiological images as listed and agree with the findings in the report.  DG Chest Port 1 View  Result Date: 12/23/2022 CLINICAL DATA:  Status post bronchoscopy EXAM: PORTABLE CHEST 1 VIEW COMPARISON:  X-ray 11/10/2022.  CT 12/18/2022. FINDINGS: Diffuse parenchymal opacity with interstitial components in the left mid to lower lung, increased from prior CT. Obscuration of the previous nodule. No pleural effusion or pneumothorax. Normal cardiopericardial silhouette. Overlapping cardiac leads. IMPRESSION: No pneumothorax identified diffuse interstitial and parenchymal opacities increasing in the left lung. Electronically Signed   By: Karen Kays M.D.   On: 12/23/2022 14:04   DG C-ARM BRONCHOSCOPY  Result Date: 12/23/2022 C-ARM BRONCHOSCOPY: Fluoroscopy was utilized by the requesting physician.  No radiographic interpretation.   DG C-Arm 1-60 Min-No Report  Result Date: 12/23/2022 Fluoroscopy was utilized by the requesting physician.  No radiographic interpretation.   CT Super D Chest Wo Contrast  Result Date: 12/18/2022 CLINICAL DATA:   Follow-up lung nodule.  Bone metastases. * Tracking Code: BO * EXAM: CT CHEST WITHOUT CONTRAST TECHNIQUE: Multidetector CT imaging of the chest was performed using thin slice collimation for electromagnetic bronchoscopy planning purposes, without intravenous contrast. RADIATION DOSE REDUCTION: This exam was performed according to the departmental dose-optimization program which includes automated exposure control, adjustment of the mA and/or kV according to patient size and/or use of iterative reconstruction technique. COMPARISON:  11/11/2022 FINDINGS: Cardiovascular: The heart size appears normal. There is a small pericardial effusion. Aortic atherosclerosis. Mediastinum/Nodes: Thyroid gland, trachea, and esophagus are unremarkable. Multiple calcified mediastinal, hilar, and supraclavicular lymph nodes are identified. No adenopathy. Lungs/Pleura: No pleural effusion identified. No airspace consolidation. Spiculated nodule within the left upper lobe is again noted measuring 4.5 x 2.3 cm, image 73/5. On the previous exam this was measured at 3.9 x 1.7 cm. There is surrounding interlobular septal thickening throughout the left upper lobe which is concerning for lymphangitic spread of disease. No additional pulmonary nodule or mass identified. Upper Abdomen: No acute abnormality. Musculoskeletal: Diffuse sclerotic bone metastases are again noted the appearance is not significantly changed when compared with the previous exam. IMPRESSION: 1. Interval increase in size of spiculated nodule within the left upper lobe compatible with primary bronchogenic carcinoma. 2. There is surrounding interlobular septal thickening throughout the left upper lobe which is concerning for lymphangitic spread of disease. 3. Diffuse sclerotic bone metastases are again noted. 4. Small pericardial effusion. 5.  Aortic Atherosclerosis (ICD10-I70.0). Electronically Signed   By: Signa Kell M.D.   On: 12/18/2022 16:39    ASSESSMENT & PLAN:    72 y.o. 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  Malignant neoplasm of upper lobe of left lung (HCC) Newly diagnosed with metastasis to the bones. No evidence of liver or other visceral involvement. Patient has a history of secondhand smoke exposure. Family history of various cancers. Patient is experiencing a dry cough and has lost about 10  pounds. No pain or coughing up blood.  -Today I discussed staging, diagnosis, prognosis, plan of care, treatment options with the patient and his wife.  Reviewed NCCN guidelines.  Discussed that all treatment options are palliative in nature and not curative intent, given stage IV disease.  They verbalized understanding.  -Ordered Foundation Medicine testing on the biopsy specimen to identify potential targetable mutations.  Also request submitted for Guardant360 (liquid biopsy) for any circulating tumor DNA/mutation analysis.  Will follow-up on these results.  -Since he was a non-smoker, chances of him having actionable mutations is higher.  In such a case we will plan for targeted therapy depending on the mutations.   -Plan for chemotherapy and immunotherapy if no targetable mutations are identified.  Discussed the regimen of carboplatin, pemetrexed and Keytruda, to be given once every 3 weeks for up to 4 times followed by restaging followed by maintenance treatments with Keytruda alone or in combination with pemetrexed.  -Ordered  MRI of the brain to rule out metastasis.  -Referral sent for placement of a Port-a-cath for IV treatments.  -Plan to see patient back in two weeks to discuss treatment plan based on test results.  Metastasis to bone [facility]) - Plan to start him on Xgeva eventually.  Will obtain dental clearance from his dentist prior to  that  Anemia Hemoglobin of 10.6 recently.  Patient has been taking iron supplements.  Hemoglobin slightly better at 11.1 today.  -Ferritin is elevated at 852 but that would be an acute phase reactant.  Iron studies, B12, folate are pending.  TSH is normal.  -Encourage patient to continue taking iron supplements for now.  Dry cough Patient experiences a dry cough, particularly when talking. No pain associated with the cough. -Continue Tessalon pearls as needed for cough relief.   The total time spent in the appointment was 60 minutes encounter with the patient, including review of chart and results of various tests, discussion about plan of care and coordination of care plan.  I reviewed lab results and outside records for this visit and discussed relevant results with the patient. Diagnosis, plan of care and treatment options were also discussed in detail with the patient. Opportunity provided to ask questions and answers provided to his apparent satisfaction. Provided instructions to call our clinic with any problems, questions or concerns prior to return visit. I recommended to continue follow-up with PCP and sub-specialists. He verbalized understanding and agreed with the plan. No barriers to learning was detected.  NCCN guidelines have been consulted in the planning of this patient's care.  Meryl Crutch, MD  12/30/2022 3:58 PM  [facility] - A DEPT OF [facility] [address] [name] [address] Dept: [phone] Dept Fax: [phone]   Orders Placed This Encounter  Procedures   CBC with Differential/Platelet    Standing Status:   Future    Number of Occurrences:   1    Standing Expiration Date:   12/30/2023   Comprehensive metabolic panel    Standing Status:   Future    Number of Occurrences:   1    Standing Expiration Date:   12/30/2023   Lactate dehydrogenase    Standing Status:   Future     Number of Occurrences:   1    Standing Expiration Date:   12/30/2023   TSH    Standing Status:   Future    Number of Occurrences:   1  Standing Expiration Date:   12/30/2023   Ferritin    Standing Status:   Future    Number of Occurrences:   1    Standing Expiration Date:   12/30/2023   Iron and Iron Binding Capacity ([facility] only)    Standing Status:   Future    Number of Occurrences:   1    Standing Expiration Date:   12/30/2023   Vitamin B12    Standing Status:   Future    Number of Occurrences:   1    Standing Expiration Date:   12/30/2023   Folate    Standing Status:   Future    Number of Occurrences:   1    Standing Expiration Date:   12/30/2023    CODE STATUS:  Code Status History     Date Active Date Inactive Code Status Order ID Comments User Context   07/20/2014 [id number] 07/21/2014 1200 Full Code [id number]  Julio Sicks, MD Inpatient       Future Appointments  Date Time Provider Department Center  12/31/2022  9:00 AM Bevelyn Ngo, NP [facility] None  01/01/2023  7:00 AM [facility]-MR 1 [facility]MRI [place]  01/13/2023  1:30 PM [facility]-IR 1 [facility]IR       This document was completed utilizing speech recognition software. Grammatical errors, random word insertions, pronoun errors, and incomplete sentences are an occasional consequence of this system due to software limitations, ambient noise, and hardware issues. Any formal questions or concerns about the content, text or information contained within the body of this dictation should be directly addressed to the provider for clarification.

## 2022-12-30 NOTE — Progress Notes (Signed)
PATIENT NAVIGATOR PROGRESS NOTE  Name: Gregg Guerrero Date: 12/30/2022 MRN: [id number]  DOB: [birth year]   Reason for visit:  Met with [name] after visit with Dr Arlana Pouch  Comments:  Discussed and scheduled  Brain MRI scheduled for Thursday 12/5 at 7 am at [facility] placement scheduled for [facility] 12/1722 instructions given  Given information to contact with any questions Guardant 360 molecular testing drawn today for actionable mutations 5. Referral for Social work   Time spent counseling/coordinating care: > 60 minutes

## 2022-12-31 ENCOUNTER — Encounter: Payer: Self-pay | Admitting: Acute Care

## 2022-12-31 ENCOUNTER — Ambulatory Visit: Payer: Medicare Other | Admitting: Acute Care

## 2022-12-31 VITALS — BP 130/60 | HR 89 | Temp 97.8°F | Wt 185.6 lb

## 2022-12-31 DIAGNOSIS — K2100 Gastro-esophageal reflux disease with esophagitis, without bleeding: Secondary | ICD-10-CM | POA: Diagnosis not present

## 2022-12-31 DIAGNOSIS — C7951 Secondary malignant neoplasm of bone: Secondary | ICD-10-CM | POA: Diagnosis not present

## 2022-12-31 DIAGNOSIS — R634 Abnormal weight loss: Secondary | ICD-10-CM

## 2022-12-31 DIAGNOSIS — C3412 Malignant neoplasm of upper lobe, left bronchus or lung: Secondary | ICD-10-CM | POA: Diagnosis not present

## 2022-12-31 MED ORDER — OMEPRAZOLE 20 MG PO CPDR
20.0000 mg | DELAYED_RELEASE_CAPSULE | Freq: Every day | ORAL | 11 refills | Status: DC
Start: 2022-12-31 — End: 2023-03-24

## 2022-12-31 NOTE — Patient Instructions (Addendum)
It is good to see you today. I am glad you have done well since the procedure. I am glad you have been seen by oncology.  I will call you after I hear from [facility] today Call if you need [place].  Gregg Guerrero with your treatment. The [facility] will take great care of you.  Please contact office for sooner follow up if symptoms do not improve or worsen or seek emergency care

## 2022-12-31 NOTE — Progress Notes (Signed)
History of Present Illness Gregg Guerrero is a 72 y.o. male with a  remote history of second hand smoke exposure referred by his PCP Dr. Sigmund Hazel for consideration of a robotic bronchoscopy with biopsy of a suspicious lung mass and lesion. Followed by Dr. Aundria Rud.  Additional  PMH includes atrial fibrillation status post ablation, patent foreman ovale, dyslipidemia, TIA.    Synopsis Pt has been under the care of his PCP for what was initially thought to be a pneumonia on CXR. Patient developed shortness of breath September 25 and was seen by his primary care physician.  At that time a chest x-ray was done which showed an infiltrate and question of atypical pneumonia.  The patient was treated with antibiotics however his symptoms did not improve.  A CT of the chest was ordered to further evaluate etiology for nonresolving dyspnea. CT was done 11/11/2022 which showed a spiculated nodule in the lingula with diffuse interstitial thickening in the left upper lobe that was concerning for an aggressive process or malignancy.  Additionally there were scattered sclerotic bone lesions diffusely along the skeleton, and numerous calcified lymph nodes throughout the mediastinum and left hilum. Patient's PCP Dr. Hyacinth Meeker then ordered a PET scan in addition to a whole-body bone scan.The PET scan was notable for a 3.9 cm irregular spiculated mass in the left upper lobe, suspicious for bronchogenic carcinoma. 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 Whole body bole scan confirmed  Widespread osseous metastases. Pt. Denies any associated pain.   Pt. Was referred to [facility] for consideration of robotic assisted bronchoscopy with biopsies to further evaluate the left upper lobe lesion.  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 Per his wife the melanoma was low grade and did not require any further treatment after excision.  He also has a chronic minimally productive cough, and poor appetite and nausea. However, once he starts to eat he is fine, and is able to keep his food down.  12/31/2022 Pt. Presents for follow up after robotic bronchscopy with biopsies. He states he has been doing well since the procedure.No bleeding except for scan amounts the day of the surgery which self resolved. He has had no fever or change in secretions No bleeding , no s/s infection, he did not have any dyspnea or adverse reaction to anesthesia.  We have discussed his biopsy results. 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 Brushings from left upper lobe and lavage showed rare atypical cells. Lymph nodes were not biopsied as they were too calcified.  He was seen by oncology 12/30/2022. He was staged as Clinical stage from 12/30/2022: Stage IVB (cT2a, cN0, cM1c) . An MR Brain has been ordered to complete staging. Port a cath placement order was p[laced. Per Dr. Arlana Pouch, all treatment options are palliative in nature and not curative intent, given stage IV disease. This has upset the patient as he wants to fight this as hard as he can.[facility] testing was ordered on the biopsy specimen to identify potential targetable mutations.  Also a  request was submitted for Guardant360 (liquid biopsy) for any circulating tumor DNA/mutation analysis. Since he is  a never smoker, chances of him having actionable mutations is higher.  In such a case we will plan for targeted therapy depending on the  mutations.  Plan for chemotherapy and immunotherapy if no targetable mutations are identified.  Discussed the regimen of carboplatin, pemetrexed and Keytruda, to be given once every 3 weeks for up to 4 times followed by restaging followed by maintenance treatments with Keytruda alone or in  combination with pemetrexed. For bone metastasis, plan is for Xgeva  after clearance by his dentist.  Pt does still endorse dry cough, particularly when talking. No pain associated with the cough, he is taking Tessalon pearls as needed for cough relief.I did offer hydromet, and he declined for now.   Test Results: Cytology 12/23/2022 A. LUNG, LUL, FINE NEEDLE ASPIRATION:  - Adenocarcinoma   B. LUNG, LUL, BRUSHING:  - Rare atypical cells present  - Predominantly blood   D. LUNG, LUL, LAVAGE:    FINAL MICROSCOPIC DIAGNOSIS:  - Atypical cells present   Aerobic anaerobic Cx RARE CUTIBACTERIUM ACNES     Latest Ref Rng & Units 12/30/2022    1:20 PM 12/18/2022    3:47 PM 05/09/2021    1:43 PM  CBC  WBC 4.0 - 10.5 K/uL 6.8  5.7  5.2   Hemoglobin 13.0 - 17.0 g/dL 84.6  96.2  95.2   Hematocrit 39.0 - 52.0 % 36.0  33.5  47.5   Platelets 150 - 400 K/uL 187  157  191        Latest Ref Rng & Units 12/30/2022    1:20 PM 12/18/2022    3:47 PM 05/09/2021    1:43 PM  BMP  Glucose 70 - 99 mg/dL 94  841  324   BUN 8 - 23 mg/dL 19  21  18    Creatinine 0.61 - 1.24 mg/dL 4.01  0.27  2.53   Sodium 135 - 145 mmol/L 140  140  142   Potassium 3.5 - 5.1 mmol/L 5.4  5.0  4.4   Chloride 98 - 111 mmol/L 109  109  111   CO2 22 - 32 mmol/L 26  26  24    Calcium 8.9 - 10.3 mg/dL 8.9  9.1  9.2     BNP No results found for: "BNP"  ProBNP No results found for: "PROBNP"  PFT No results found for: "FEV1PRE", "FEV1POST", "FVCPRE", "FVCPOST", "TLC", "DLCOUNC", "PREFEV1FVCRT", "PSTFEV1FVCRT"  DG Chest Port 1 View  Result Date: 12/23/2022 CLINICAL DATA:  Status post bronchoscopy EXAM: PORTABLE CHEST 1 VIEW COMPARISON:  X-ray 11/10/2022.  CT 12/18/2022. FINDINGS: Diffuse parenchymal opacity with interstitial components in the left mid to lower lung, increased from prior CT. Obscuration of the previous nodule. No pleural effusion or pneumothorax. Normal cardiopericardial silhouette. Overlapping cardiac  leads. IMPRESSION: No pneumothorax identified diffuse interstitial and parenchymal opacities increasing in the left lung. Electronically Signed   By: Karen Kays M.D.   On: 12/23/2022 14:04   DG C-ARM BRONCHOSCOPY  Result Date: 12/23/2022 C-ARM BRONCHOSCOPY: Fluoroscopy was utilized by the requesting physician.  No radiographic interpretation.   DG C-Arm 1-60 Min-No Report  Result Date: 12/23/2022 Fluoroscopy was utilized by the requesting physician.  No radiographic interpretation.   CT Super D Chest Wo Contrast  Result Date: 12/18/2022 CLINICAL DATA:  Follow-up lung nodule.  Bone metastases. * Tracking Code: BO * EXAM: CT CHEST WITHOUT CONTRAST TECHNIQUE: Multidetector CT imaging of the chest was performed using thin slice collimation for electromagnetic bronchoscopy planning purposes, without intravenous contrast. RADIATION DOSE REDUCTION: This exam was performed according to the departmental dose-optimization program which includes automated exposure control, adjustment of the mA and/or kV according to  patient size and/or use of iterative reconstruction technique. COMPARISON:  11/11/2022 FINDINGS: Cardiovascular: The heart size appears normal. There is a small pericardial effusion. Aortic atherosclerosis. Mediastinum/Nodes: Thyroid gland, trachea, and esophagus are unremarkable. Multiple calcified mediastinal, hilar, and supraclavicular lymph nodes are identified. No adenopathy. Lungs/Pleura: No pleural effusion identified. No airspace consolidation. Spiculated nodule within the left upper lobe is again noted measuring 4.5 x 2.3 cm, image 73/5. On the previous exam this was measured at 3.9 x 1.7 cm. There is surrounding interlobular septal thickening throughout the left upper lobe which is concerning for lymphangitic spread of disease. No additional pulmonary nodule or mass identified. Upper Abdomen: No acute abnormality. Musculoskeletal: Diffuse sclerotic bone metastases are again noted the  appearance is not significantly changed when compared with the previous exam. IMPRESSION: 1. Interval increase in size of spiculated nodule within the left upper lobe compatible with primary bronchogenic carcinoma. 2. There is surrounding interlobular septal thickening throughout the left upper lobe which is concerning for lymphangitic spread of disease. 3. Diffuse sclerotic bone metastases are again noted. 4. Small pericardial effusion. 5.  Aortic Atherosclerosis (ICD10-I70.0). Electronically Signed   By: Signa Kell M.D.   On: 12/18/2022 16:39     Past medical hx Past Medical History:  Diagnosis Date   Anxiety    Arthritis    Atrial fibrillation [facility])    s/p PVI at [facility] by Dr Sampson Goon in 2009   Concussion 12/2014   GERD (gastroesophageal reflux disease)    Headache    History of kidney stones    History of TIA (transient ischemic attack)    hx of 2   Migraine    hx of   PFO (patent foramen ovale)    RLS (restless legs syndrome)    SDH (subdural hematoma) (HCC)    s/p right parietal burr hole for subacute SDH evacuation 07/20/14   Sleep apnea    mild-no cpap needed   TIA (transient ischemic attack)      Social History   Tobacco Use   Smoking status: Never    Passive exposure: Past   Smokeless tobacco: Never  Vaping Use   Vaping status: Never Used  Substance Use Topics   Alcohol use: Yes    Comment: moderately   Drug use: No    Mr.Oesterling reports that he has never smoked. He has been exposed to tobacco smoke. He has never used smokeless tobacco. He reports current alcohol use. He reports that he does not use drugs.  Tobacco Cessation: Counseling given: Not Answered Never smoker,but + second hand smoke.  Past surgical hx, Family hx, Social hx all reviewed.  Current Outpatient Medications on File Prior to Visit  Medication Sig   atorvastatin (LIPITOR) 10 MG tablet Take 10 mg by mouth at bedtime.   Cholecalciferol (DIALYVITE VITAMIN D 5000) 125 MCG (5000 UT)  capsule Take 1,500 Units by mouth every Saturday.   clonazePAM (KLONOPIN) 1 MG tablet Take 1 mg by mouth at bedtime.   clopidogrel (PLAVIX) 75 MG tablet Take 75 mg by mouth daily.   Ferrous Sulfate (IRON) 325 (65 FE) MG TABS Take 325 mg by mouth daily.    fluticasone (FLONASE) 50 MCG/ACT nasal spray Place 1 spray into both nostrils daily as needed for allergies.   latanoprost (XALATAN) 0.005 % ophthalmic solution Place 1 drop into both eyes at bedtime.   loratadine (CLARITIN) 10 MG tablet Take 10 mg by mouth at bedtime.   MAGNESIUM PO Take 1 tablet by mouth daily.  Multiple Vitamin (MULTIVITAMIN) tablet Take 1 tablet by mouth daily.   traZODone (DESYREL) 50 MG tablet Take 25 mg by mouth at bedtime.   UNABLE TO FIND Take 30 mg by mouth daily. Med Name: Zinc Chelated   No current facility-administered medications on file prior to visit.     No Known Allergies  Review Of Systems:  Constitutional:   +  weight loss, night sweats,  Fevers, chills, +fatigue, or  lassitude.  HEENT:   No headaches,  Difficulty swallowing,  Tooth/dental problems, or  Sore throat,                No sneezing, itching, ear ache, + nasal congestion, post nasal drip,   CV:  No chest pain,  Orthopnea, PND, swelling in lower extremities, anasarca, dizziness, palpitations, syncope.   GI  + heartburn, indigestion, abdominal pain, +nausea, No vomiting, diarrhea, change in bowel habits, + loss of appetite, bloody stools.   Resp: No shortness of breath with exertion or at rest.  No excess mucus, no productive cough,  + non-productive cough,  No coughing up of blood.  No change in color of mucus.  No wheezing.  No chest wall deformity  Skin: no rash or lesions.  GU: no dysuria, change in color of urine, no urgency or frequency.  No flank pain, no hematuria   MS:  No joint pain or swelling.  No decreased range of motion.  No back pain.  Psych:  No change in mood or affect. + depression or anxiety.  No memory  loss.   Vital Signs BP 130/60 (BP Location: Left Arm, Cuff Size: Normal)   Pulse 89   SpO2 97%    Physical Exam:  General- No distress,  A&Ox3, pleasant ENT: No sinus tenderness, TM clear, pale nasal mucosa, no oral exudate,no post nasal drip, no LAN Cardiac: S1, S2, regular rate and rhythm, no murmur Chest: No wheeze/ rales/ dullness; no accessory muscle use, no nasal flaring, no sternal retractions Abd.: Soft Non-tender, ND, BS +, Body mass index is 24.49 kg/m.  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 I am glad you have been seen by oncology, and there is a plan.  I will call you after I hear from [facility] today Call if you need [place].  Daisey Must with your treatment. The [facility] will take great care of you, but if you fel you need a second opinion please evaluate your options .  Please contact office for sooner follow up if symptoms do not improve or worsen or seek emergency care    I spent 60 minutes dedicated to the care of this patient on the date of this encounter to include pre-visit review of records, face-to-face time with the patient discussing conditions above, post visit ordering of testing, clinical documentation with the electronic health record, making appropriate referrals as documented, and communicating necessary information to the patient's healthcare team.    Bevelyn Ngo, NP 12/31/2022  9:06 AM

## 2023-01-01 ENCOUNTER — Ambulatory Visit (HOSPITAL_COMMUNITY)
Admission: RE | Admit: 2023-01-01 | Discharge: 2023-01-01 | Disposition: A | Discharge status: Home / Self Care | Payer: Medicare Other | Source: Ambulatory Visit | Attending: Oncology | Admitting: Oncology

## 2023-01-01 ENCOUNTER — Telehealth: Payer: Self-pay | Admitting: Acute Care

## 2023-01-01 ENCOUNTER — Other Ambulatory Visit: Payer: Self-pay

## 2023-01-01 ENCOUNTER — Inpatient Hospital Stay: Payer: Medicare Other

## 2023-01-01 DIAGNOSIS — C3412 Malignant neoplasm of upper lobe, left bronchus or lung: Secondary | ICD-10-CM | POA: Insufficient documentation

## 2023-01-01 MED ORDER — GADOBUTROL 1 MMOL/ML IV SOLN
8.0000 mL | Freq: Once | INTRAVENOUS | Status: AC | PRN
  Administered 2023-01-01: 8 mL via INTRAVENOUS

## 2023-01-01 NOTE — Telephone Encounter (Signed)
Hi Gregg Guerrero,   Pt called and LVM. Were you wanting him to see Dr. Jerolyn Center? Just making sure I send the message to the right provider. Thanks.   [name]

## 2023-01-01 NOTE — Telephone Encounter (Signed)
Called patient and updated him on treatment plans. He verbalized understanding.

## 2023-01-01 NOTE — Progress Notes (Signed)
[facility] Clinical Social Work  Initial Assessment   Gregg Guerrero is a 72 y.o. year old male contacted by phone. Clinical Social Work was referred by nurse navigator for assessment of psychosocial needs.   SDOH (Social Determinants of Health) assessments performed: No SDOH Interventions    Flowsheet Row Office Visit from 12/30/2022 in [facility] [place]  SDOH Interventions   Food Insecurity Interventions Intervention Not Indicated  Housing Interventions Intervention Not Indicated  Transportation Interventions Intervention Not Indicated  Utilities Interventions Intervention Not Indicated       SDOH Screenings   Food Insecurity: No Food Insecurity (12/30/2022)  Housing: Low Risk  (12/30/2022)  Transportation Needs: No Transportation Needs (12/30/2022)  Utilities: Not At Risk (12/30/2022)  Depression (PHQ2-9): Low Risk  (12/30/2022)  Tobacco Use: Low Risk  (12/31/2022)     Distress Screen completed: No     No data to display            Family/Social Information:  Housing Arrangement: patient lives with spouse. Family members/support persons in your life? Family Transportation concerns: no, patient or spouse will transport to appointments.  Employment: Retired .  Income source: Actor concerns: No Type of concern: None Food access concerns: no Religious or spiritual practice: Not known Services Currently in place:  Insurance, Family, Transportation, Income  Coping/ Adjustment to diagnosis: Patient understands treatment plan and what happens next? Yes. Concerns about diagnosis and/or treatment:  Patient did not express concerns about treatment or diagnosis.  Patient reported stressors: Adjusting to my illness Current coping skills/ strengths: Average or above average intelligence , Capable of independent living , Communication skills , Financial means , General fund of knowledge , Motivation for  treatment/growth , and Supportive family/friends     SUMMARY: Current SDOH Barriers:  No SDOH barriers reported at time of assessment.  Clinical Social Work Clinical Goal(s):  No clinical social work goals at this time  Interventions: Discussed common feeling and emotions when being diagnosed with cancer, and the importance of support during treatment Informed patient of the support team roles and support services at [facility]: Lung Virtual Support Group every 3rd Tuesday virtual - contact [phone] if interested. Provided CSW contact information and encouraged patient to call with any questions or concerns   Follow Up Plan: Patient will contact CSW with any support or resource needs Patient verbalizes understanding of plan: Yes  Gregg Merles, LCSW Clinical Social Worker [facility]

## 2023-01-05 ENCOUNTER — Encounter (HOSPITAL_COMMUNITY): Payer: Self-pay

## 2023-01-05 NOTE — Progress Notes (Signed)
The proposed treatment discussed in conference is for discussion purpose only and is not a binding recommendation.  The patients have not been physically examined, or presented with their treatment options.  Therefore, final treatment plans cannot be decided.  

## 2023-01-06 ENCOUNTER — Other Ambulatory Visit: Payer: Self-pay | Admitting: Oncology

## 2023-01-06 ENCOUNTER — Other Ambulatory Visit (HOSPITAL_COMMUNITY): Payer: Self-pay

## 2023-01-06 ENCOUNTER — Encounter: Payer: Self-pay | Admitting: Oncology

## 2023-01-06 ENCOUNTER — Telehealth: Payer: Self-pay

## 2023-01-06 ENCOUNTER — Other Ambulatory Visit: Payer: Self-pay | Admitting: *Deleted

## 2023-01-06 ENCOUNTER — Telehealth: Payer: Self-pay | Admitting: Pharmacy Technician

## 2023-01-06 ENCOUNTER — Encounter (HOSPITAL_COMMUNITY): Payer: Self-pay

## 2023-01-06 DIAGNOSIS — C3412 Malignant neoplasm of upper lobe, left bronchus or lung: Secondary | ICD-10-CM

## 2023-01-06 MED ORDER — LORLATINIB 100 MG PO TABS
100.0000 mg | ORAL_TABLET | Freq: Every day | ORAL | 5 refills | Status: DC
  Filled 2023-01-08: qty 30, 30d supply, fill #0
  Filled 2023-02-02: qty 30, 30d supply, fill #1
  Filled 2023-03-03: qty 30, 30d supply, fill #2
  Filled 2023-03-30: qty 30, 30d supply, fill #3

## 2023-01-06 NOTE — Progress Notes (Signed)
EKG ordered for 12/17 clinic visit prior to therapy start

## 2023-01-06 NOTE — Telephone Encounter (Signed)
Oral Oncology Patient Advocate Encounter  Prior Authorization for [name] has been approved.    PA# [id number] Effective dates: 01/06/23 through 01/27/24  Patients co-pay is $1,725.16.    Jinger Neighbors, CPhT-Adv Oncology Pharmacy Patient Advocate [facility] Direct Number: [phone]  Fax: [phone]

## 2023-01-06 NOTE — Telephone Encounter (Signed)
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  Labs from 12/30/2022 (CBC, CMP) assessed, no interventions needed. Prescription dose and frequency assessed for appropriateness.   Current medication list in Epic reviewed, DDIs with [place] identified: - Atorvastatin and trazodone (cat C): monitor for decreased concentration of both atorvastatin and trazodone. Will notify patient.   Evaluated chart and no patient barriers to medication adherence noted.  Patient agreement for treatment documented in MD note on 01/06/2023.  Prescription has been e-scribed to the [facility] for benefits analysis and approval. Oral Oncology Clinic will continue to follow for insurance authorization, copayment issues, initial counseling and start date.  Bethel Born, PharmD Hematology/Oncology Clinical Pharmacist [facility] Oral Chemotherapy Navigation Clinic [phone] 01/06/2023 2:32 PM

## 2023-01-06 NOTE — Progress Notes (Signed)
Foundation One CDX testing showed evidence of ALK mutation.  I called patient and his wife with results.  Plan is to start him on Lorlatinib 100 mg p.o. daily.  We will arrange for chemo education next week.  Appointment for Port-A-Cath canceled.  Prescription sent to specialty pharmacy for authorization and delivery.

## 2023-01-06 NOTE — Progress Notes (Signed)
START ON PATHWAY REGIMEN - Non-Small Cell Lung     A cycle is every 28 days:     Lorlatinib   **Always confirm dose/schedule in your pharmacy ordering system**  Patient Characteristics: Stage IV Metastatic, Nonsquamous, Molecular Analysis Completed, Molecular Alteration Present and Eligible for Molecular Targeted Therapy, Initial Molecular Targeted Therapy, ALK Fusion/Rearrangement Positive Therapeutic Status: Stage IV Metastatic Histology: Nonsquamous Cell Broad Molecular Profiling Status: Molecular Analysis Completed Molecular Analysis Results: Alteration Present and Eligible for Molecular Targeted Therapy Molecular Alteration Present: ALK Fusion/Rearrangement Positive Molecular Targeted Line of Therapy: Initial Molecular Targeted Therapy Intent of Therapy: Non-Curative / Palliative Intent, Discussed with Patient

## 2023-01-06 NOTE — Telephone Encounter (Signed)
Oral Oncology Patient Advocate Encounter   Received notification that prior authorization for Gregg Guerrero is required.   PA submitted on 01/06/23 Key [id number] Status is pending     Gregg Guerrero, CPhT-Adv Oncology Pharmacy Patient Advocate [facility] Direct Number: [phone]  Fax: [phone]

## 2023-01-07 ENCOUNTER — Other Ambulatory Visit: Payer: Self-pay

## 2023-01-08 ENCOUNTER — Other Ambulatory Visit: Payer: Self-pay

## 2023-01-08 ENCOUNTER — Encounter: Payer: Self-pay | Admitting: Oncology

## 2023-01-08 ENCOUNTER — Telehealth: Payer: Self-pay

## 2023-01-08 ENCOUNTER — Other Ambulatory Visit (HOSPITAL_COMMUNITY): Payer: Self-pay

## 2023-01-08 NOTE — Progress Notes (Signed)
Patient counseled in telephone encounter opened on 01/06/23.  Bethel Born, PharmD Hematology/Oncology Clinical Pharmacist [facility] Oral Chemotherapy Navigation Clinic [phone]

## 2023-01-08 NOTE — Telephone Encounter (Signed)
Oral Oncology Pharmacist Encounter  I spoke with patient and patients wife for overview of new oral chemotherapy medication: Lorbrena (lorlatinib) for the treatment of metastatic non-small cell lung cancer, ALK positive, planned duration until disease progression or unacceptable drug toxicity.   Pt is doing well. Counseled patient on administration, dosing, side effects, monitoring, drug-food interactions, safe handling, storage, and disposal.   Patient will take Lorbrena 100 mg tablets, 1 tablet by mouth daily with or without food.  Patient knows to avoid grapefruit and grapefruit juice while on [place]   Start date: 01/14/2023   Side effects include but not limited to: increased blood pressure, edema, changes in electrolytes, increase in cholesterol and triglycerides, changes in LFTs, peripheral neuropathy, CNS/mood changes, and rare but serious risk for AV block and ILD/pneumonitis.  Patient will get EKG completed at office visit on 01/13/23. Patient knows to wait to take the medication until he has seen Dr. Arlana Pouch.    Reviewed with patient importance of keeping a medication schedule and plan for any missed doses.   After discussion with patient no patient barriers to medication adherence identified.    Patient and patients wife voiced understanding and appreciation. All questions answered. Medication handout sent in the mail to patient.    Provided patient with Oral Chemotherapy Navigation Clinic phone number. Patient knows to call the office with questions or concerns. Oral Chemotherapy Navigation Clinic will continue to follow.   Bethel Born, PharmD Hematology/Oncology Clinical Pharmacist [facility]otherapy [facility] [phone]

## 2023-01-08 NOTE — Telephone Encounter (Signed)
Oral Oncology Patient Advocate Encounter   Was successful in obtaining a voucher card for [place]  This voucher card will make the patients copay $0.00 for the first fill.  I have spoken with the patient.    The billing information is as follows and has been shared with [facility].   RxBin: [id number] PCN:  Member ID: [id number] Group ID: [id number]   Ardeen Fillers, CPhT Oncology Pharmacy Patient Advocate  [facility]  [phone] (phone) [phone] (fax) 01/08/2023 12:26 PM

## 2023-01-08 NOTE — Telephone Encounter (Signed)
Patient successfully OnBoarded and drug education provided by pharmacist. Medication scheduled to be shipped on Friday, 01/09/23, for delivery on Monday, 01/12/23, from [facility] to patient's address. Patient also knows to call me at [phone] with any questions or concerns regarding receiving medication or if there is any unexpected change in co-pay.   Ardeen Fillers, CPhT Oncology Pharmacy Patient Advocate  [facility]  [phone] (phone) [phone] (fax) 01/08/2023 1:22 PM

## 2023-01-08 NOTE — Progress Notes (Signed)
Specialty Pharmacy Initial Fill Coordination Note  Gregg Guerrero is a 72 y.o. male contacted today regarding initial fill of specialty medication(s) Lorlatinib [name])  Patient requested Delivery   Delivery date: 01/12/23   Verified address: [address] [place], [name] [address]  Medication will be filled on 01/09/23.   Patient is aware of $0.00 copayment.    Ardeen Fillers, CPhT Oncology Pharmacy Patient Advocate  [facility]  [phone] (phone) [phone] (fax) 01/08/2023 1:20 PM

## 2023-01-10 ENCOUNTER — Other Ambulatory Visit: Payer: Self-pay

## 2023-01-11 ENCOUNTER — Other Ambulatory Visit: Payer: Self-pay

## 2023-01-13 ENCOUNTER — Inpatient Hospital Stay: Payer: Medicare Other

## 2023-01-13 ENCOUNTER — Other Ambulatory Visit: Payer: Self-pay | Admitting: *Deleted

## 2023-01-13 ENCOUNTER — Other Ambulatory Visit (HOSPITAL_COMMUNITY): Payer: Medicare Other

## 2023-01-13 ENCOUNTER — Inpatient Hospital Stay: Payer: Medicare Other | Admitting: Oncology

## 2023-01-13 ENCOUNTER — Ambulatory Visit (HOSPITAL_COMMUNITY): Payer: Medicare Other

## 2023-01-13 ENCOUNTER — Other Ambulatory Visit: Payer: Self-pay

## 2023-01-13 ENCOUNTER — Encounter: Payer: Self-pay | Admitting: Oncology

## 2023-01-13 VITALS — BP 132/62 | HR 90 | Temp 97.6°F | Resp 18 | Ht 73.0 in | Wt 184.4 lb

## 2023-01-13 DIAGNOSIS — R110 Nausea: Secondary | ICD-10-CM | POA: Diagnosis not present

## 2023-01-13 DIAGNOSIS — C7951 Secondary malignant neoplasm of bone: Secondary | ICD-10-CM

## 2023-01-13 DIAGNOSIS — C3412 Malignant neoplasm of upper lobe, left bronchus or lung: Secondary | ICD-10-CM

## 2023-01-13 MED ORDER — ONDANSETRON 8 MG PO TBDP: 8.0000 mg | ORAL_TABLET | Freq: Three times a day (TID) | ORAL | 3 refills | Status: AC | PRN

## 2023-01-13 NOTE — Assessment & Plan Note (Addendum)
Elevated alkaline phosphatase likely due to bone disease. Discussed the use of Xgeva to prevent fractures and strengthen bones. -Start Xgeva injections on RTC in two weeks and continue it monthly. -Start calcium supplements daily.

## 2023-01-13 NOTE — Progress Notes (Signed)
CANCER CENTER  ONCOLOGY CLINIC PROGRESS NOTE   Patient Care Team: Sigmund Hazel, MD as PCP - General (Family Medicine) Revankar, Aundra Dubin, MD as PCP - Cardiology (Cardiology) Lanier Prude, MD as PCP - Electrophysiology (Cardiology) Raechel Chute, MD as Consulting Physician (Pulmonary Disease) Lytle Butte, RN as Oncology Nurse Navigator  PATIENT NAME: Gregg Guerrero   MR#: [id number] DOB: 11/03/1950  Date of visit: 01/13/2023  ASSESSMENT & PLAN:   Gregg Guerrero is a 72 y.o. 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 No evidence of liver or other visceral involvement. Patient has a history of secondhand smoke exposure. Family history of various cancers.   -Previously I discussed staging, diagnosis, prognosis, plan of care, treatment options with the patient and his wife.  Reviewed NCCN guidelines.  Discussed that all treatment options are palliative in nature and not curative intent, given stage IV disease.  They verbalized understanding.  - Ordered Foundation Medicine testing on the biopsy specimen to identify potential targetable mutations.  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  No other major actionable mutations.  -Given ALK mutation positivity, he would be candidate for targeted therapy.  Plan made to proceed with Lorlatinib 100 mg daily dosing.  He has had chemo education already and is scheduled to start treatments starting from 01/14/2023.  Side  effects include but not limited to: increased blood pressure, edema, changes in electrolytes, increase in cholesterol and triglycerides, changes in LFTs, peripheral neuropathy, CNS/mood changes, and rare but serious risk for AV block and ILD/pneumonitis.  EKG on 01/13/2023 showed normal sinus rhythm, normal QT interval of 372 ms.  This will be monitored periodically as needed.  -MRI of the brain performed on 01/01/2023.  It was resulted today (01/13/2023).  No definite evidence of intracranial metastatic disease noted.  Patient was informed over the phone.  -I will plan to see him in 2 weeks for toxicity evaluation.  -Given bone metastatic disease, we will start him on Xgeva when he returns to clinic in 2 weeks.  Will continue this every 4 weeks thereafter.   Metastasis to bone (HCC) Elevated alkaline phosphatase likely due to bone disease. Discussed the use of Xgeva to prevent fractures and strengthen bones. -Start Xgeva injections on RTC in two weeks and continue it monthly. -Start calcium supplements daily.  Nausea without vomiting Reports nausea before meals. Discussed the use of Zofran to manage this symptom. -Prescribe Zofran to be taken 30 minutes before meals and as needed up to three times a day.    I reviewed lab results and outside records for this visit and discussed relevant results with the patient. Diagnosis, plan of care and treatment options were also discussed in detail with the patient. Opportunity provided to ask questions and answers provided to his apparent satisfaction. Provided instructions to call our clinic with any problems, questions or concerns prior to return visit. I recommended to continue follow-up with PCP and sub-specialists. He verbalized understanding and agreed with the plan.   NCCN guidelines have been  consulted in the planning of this patient's care.  I spent a total of 40 minutes during this encounter with the patient including review of chart and  various tests results, discussions about plan of care and coordination of care plan.   Meryl Crutch, MD  01/13/2023 5:17 PM  [facility] - A DEPT OF [facility] [address] [name] [address] Dept: [phone] Dept Fax: [phone]    CHIEF COMPLAINT/ REASON FOR VISIT:   Adenocarcinoma of left lung with metastasis to the bones.  Stage IV disease.  ALK mutation positive.  Current Treatment: Lorlatinib 100 mg p.o. daily starting from 01/14/2023.  INTERVAL HISTORY:    Discussed the use of AI scribe software for clinical note transcription with the patient, who gave verbal consent to proceed.   Gregg Guerrero is here today for repeat clinical assessment.  He reports no significant changes in his condition since his last visit. He has not been experiencing any bone pain or other significant symptoms related to his cancer. He has been taking a zinc supplement in an attempt to improve his appetite, but reports no noticeable improvement. He also takes a gentle iron supplement and vitamin D daily. The patient's spouse reports that the patient's cough has improved recently. The patient has a history of a subdural hematoma six years ago, but reports no current neurological symptoms. He has also had shoulder surgery in the past. The patient is about to start a new targeted therapy medication and has questions about potential side effects and how the medication will affect his cancer.  I have reviewed the past medical history, past surgical history, social history and family history with the patient and they are unchanged from previous note.  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 Additionally there were scattered sclerotic bone lesions diffusely along the skeleton, and numerous calcified lymph nodes throughout the mediastinum and left hilum.    12/04/2022 PET scan   PET scan was notable for a 3.9 cm irregular spiculated mass in the left upper lobe, suspicious for bronchogenic carcinoma. 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  Whole body bone scan confirmed widespread osseous metastases. He denies any associated pain.    12/23/2022 Initial Diagnosis   Malignant neoplasm of upper lobe of left lung [facility]):  He has been under the care of his PCP for what was initially thought to be a pneumonia on CXR. Patient developed shortness of breath in September 2024 and was seen by his primary care physician.  At that time a chest x-ray was done which showed an infiltrate and question of atypical pneumonia.  The patient was treated with antibiotics however his symptoms did not improve.  A CT of the chest was ordered to further evaluate etiology for nonresolving dyspnea.  CT chest on 11/11/2022 which showed a spiculated nodule in the lingula with diffuse interstitial thickening in the left upper lobe that was concerning for an aggressive process or malignancy.  This led to PET/CT, pulmonology referral and additional evaluation.  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 Per his wife the melanoma was low grade and did not require any  further treatment after excision.    12/23/2022 Procedure   Patient had initial consultation in pulmonology clinic on 12/17/2022 and underwent navigational bronchoscopy, EBUS and biopsy of left upper lobe lung nodule on 12/23/2022, performed by Dr. Larinda Buttery.  On EBUS, hilar and mediastinal lymph node stations were examined but no lymph nodes were encountered.  This was suspected to be secondary to severe calcification noted on CT scan.   12/23/2022 Pathology Results   Pathology from left upper lobe lung nodule biopsy came back positive for adenocarcinoma.  Brushings from left upper lobe and lavage showed rare atypical cells.   12/30/2022 Cancer Staging   Staging form: Lung, AJCC 8th Edition - Clinical stage from 12/30/2022: Stage IVB (cT2a, cN0, cM1c) - Signed by Meryl Crutch, MD on 12/30/2022 Stage prefix: Initial diagnosis    Miscellaneous   Request submitted for Foundation Medicine testing on the specimen and Guardant 360 (liquid biopsy) for any actionable mutations and also for PD-L1 testing.  Treatment decisions to be made pending these results.   01/05/2023 Pathology Results   Foundation One CDx: ALK + EML4-ALK fusion (Variant 1)  PD-L1 TPS 5%  DNMT3A P904L mutation noted (unknown significance in his tumor type).    01/06/2023 -  Chemotherapy   Patient is on Treatment Plan : LUNG NSCLC Lorlatinib q28d         REVIEW OF SYSTEMS:   Review of Systems - Oncology  All other pertinent systems were reviewed with the patient and are negative.  ALLERGIES: He has no known allergies.  MEDICATIONS:  Current Outpatient Medications  Medication Sig Dispense Refill   atorvastatin (LIPITOR) 10 MG tablet Take 10 mg by mouth at bedtime.     benzonatate (TESSALON) 200 MG capsule Take 200 mg by mouth 3 (three) times daily as needed for cough.     Cholecalciferol (DIALYVITE VITAMIN D 5000) 125 MCG (5000 UT) capsule Take 1,500 Units by mouth every Saturday.     clonazePAM (KLONOPIN) 1 MG  tablet Take 1 mg by mouth at bedtime.     clopidogrel (PLAVIX) 75 MG tablet Take 75 mg by mouth daily.     FERROUS BISGLYCINATE CHELATE PO Take 25 mg by mouth 1 day or 1 dose.     fluticasone (FLONASE) 50 MCG/ACT nasal spray Place 1 spray into both nostrils daily as needed for allergies.     latanoprost (XALATAN) 0.005 % ophthalmic solution Place 1 drop into both eyes at bedtime.     loratadine (CLARITIN) 10 MG tablet Take 10 mg by mouth at bedtime.     MAGNESIUM PO Take 1 tablet by mouth daily.     Multiple Vitamin (MULTIVITAMIN) tablet Take 1 tablet by mouth daily.     omeprazole (PRILOSEC) 20 MG capsule Take 1 capsule (20 mg total) by mouth daily. 30 capsule 11   ondansetron (ZOFRAN-ODT) 8 MG disintegrating tablet Take 1 tablet (8 mg total) by mouth every 8 (eight) hours as needed for nausea or vomiting. 60 tablet 3   traZODone (DESYREL) 50 MG tablet Take 25 mg  by mouth at bedtime.     Ferrous Sulfate (IRON) 325 (65 FE) MG TABS Take 325 mg by mouth daily.  (Patient not taking: Reported on 01/13/2023)     lorlatinib (LORBRENA) 100 MG tablet Take 1 tablet (100 mg total) by mouth daily. Swallow tablets whole. Do not chew, crush or split tablets. 28 tablet 5   UNABLE TO FIND Take 30 mg by mouth daily. Med Name: Zinc Chelated     No current facility-administered medications for this visit.     VITALS:   Blood pressure 132/62, pulse 90, temperature 97.6 F (36.4 C), temperature source Temporal, resp. rate 18, height 6\' 1"  (1.854 m), weight 184 lb 6.4 oz (83.6 kg), SpO2 99%.  Wt Readings from Last 3 Encounters:  01/13/23 184 lb 6.4 oz (83.6 kg)  12/31/22 185 lb 9.6 oz (84.2 kg)  12/30/22 188 lb 6.4 oz (85.5 kg)    Body mass index is 24.33 kg/m.  Performance status (ECOG): 1 - Symptomatic but completely ambulatory  PHYSICAL EXAM:   Physical Exam Constitutional:      General: He is not in acute distress.    Appearance: Normal appearance.  HENT:     Head: Normocephalic and  atraumatic.  Eyes:     General: No scleral icterus.    Conjunctiva/sclera: Conjunctivae normal.  Cardiovascular:     Rate and Rhythm: Normal rate and regular rhythm.     Heart sounds: Normal heart sounds.  Pulmonary:     Effort: Pulmonary effort is normal.     Breath sounds: Normal breath sounds.  Abdominal:     General: There is no distension.  Musculoskeletal:     Right lower leg: No edema.     Left lower leg: No edema.  Neurological:     General: No focal deficit present.     Mental Status: He is alert and oriented to person, place, and time.  Psychiatric:        Mood and Affect: Mood normal.        Behavior: Behavior normal.        Thought Content: Thought content normal.       LABORATORY DATA:   I have reviewed the data as listed.  No results found for any visits on 01/13/23.  Lab Results  Component Value Date   WBC 6.8 12/30/2022   NEUTROABS 5.3 12/30/2022   HGB 11.1 (L) 12/30/2022   HCT 36.0 (L) 12/30/2022   MCV 88.0 12/30/2022   PLT 187 12/30/2022       Component Value Date/Time   NA 140 12/30/2022 1320   K 5.4 (H) 12/30/2022 1320   CL 109 12/30/2022 1320   CO2 26 12/30/2022 1320   GLUCOSE 94 12/30/2022 1320   BUN 19 12/30/2022 1320   CREATININE 1.08 12/30/2022 1320   CREATININE 1.06 12/18/2022 1547   CREATININE 1.04 04/05/2014 1622   CALCIUM 8.9 12/30/2022 1320   PROT 6.5 12/30/2022 1320   ALBUMIN 4.1 12/30/2022 1320   AST 36 12/30/2022 1320   AST 30 12/18/2022 1547   ALT 55 (H) 12/30/2022 1320   ALT 31 12/18/2022 1547   ALKPHOS 347 (H) 12/30/2022 1320   BILITOT 0.9 12/30/2022 1320   BILITOT 0.8 12/18/2022 1547   GFRNONAA >60 12/30/2022 1320   GFRNONAA >60 12/18/2022 1547   GFRAA >60 07/11/2014 1507     RADIOGRAPHIC STUDIES:  I have personally reviewed the radiological images as listed and agree with the findings in the report.  MR Brain W  Wo Contrast Result Date: 01/13/2023 CLINICAL DATA:  Metastatic disease evaluation. Recent lung  cancer diagnosis in November 2024. EXAM: MRI HEAD WITHOUT AND WITH CONTRAST TECHNIQUE: Multiplanar, multiecho pulse sequences of the brain and surrounding structures were obtained without and with intravenous contrast. CONTRAST:  8mL GADAVIST GADOBUTROL 1 MMOL/ML IV SOLN COMPARISON:  MRI brain 06/16/2014. FINDINGS: Brain: No acute infarct or hemorrhage. 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 2 mm punctate focus of abnormal enhancement in the left posterior parietal periventricular white matter (axial image 104 series 16), possibly vascular in etiology based on the sagittal postcontrast images (image 28 series 18). No associated vasogenic edema. No hydrocephalus or extra-axial collection. No mass effect or midline shift. Vascular: Normal flow voids and vessel enhancement. Skull and upper cervical spine: Prior right parietal burr hole craniotomy. Otherwise normal marrow signal and enhancement. Sinuses/Orbits: No acute findings. Other: None. IMPRESSION: 1. 2 mm punctate focus of abnormal enhancement in the left posterior parietal periventricular white matter, possibly vascular in etiology based on the sagittal postcontrast images. No associated vasogenic edema. Recommend attention on follow-up. 2. No other evidence of intracranial metastatic disease. 3. 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 Electronically Signed   By: Orvan Falconer M.D.   On: 01/13/2023 11:34   DG Chest Port 1 View Result Date: 12/23/2022 CLINICAL DATA:  Status post bronchoscopy EXAM: PORTABLE CHEST 1 VIEW COMPARISON:  X-ray 11/10/2022.  CT 12/18/2022. FINDINGS: Diffuse parenchymal opacity with interstitial components in the left mid to lower lung, increased from prior CT. Obscuration of the previous nodule. No pleural effusion or pneumothorax. Normal cardiopericardial silhouette. Overlapping cardiac leads.  IMPRESSION: No pneumothorax identified diffuse interstitial and parenchymal opacities increasing in the left lung. Electronically Signed   By: Karen Kays M.D.   On: 12/23/2022 14:04   DG C-ARM BRONCHOSCOPY Result Date: 12/23/2022 C-ARM BRONCHOSCOPY: Fluoroscopy was utilized by the requesting physician.  No radiographic interpretation.   DG C-Arm 1-60 Min-No Report Result Date: 12/23/2022 Fluoroscopy was utilized by the requesting physician.  No radiographic interpretation.   CT Super D Chest Wo Contrast Result Date: 12/18/2022 CLINICAL DATA:  Follow-up lung nodule.  Bone metastases. * Tracking Code: BO * EXAM: CT CHEST WITHOUT CONTRAST TECHNIQUE: Multidetector CT imaging of the chest was performed using thin slice collimation for electromagnetic bronchoscopy planning purposes, without intravenous contrast. RADIATION DOSE REDUCTION: This exam was performed according to the departmental dose-optimization program which includes automated exposure control, adjustment of the mA and/or kV according to patient size and/or use of iterative reconstruction technique. COMPARISON:  11/11/2022 FINDINGS: Cardiovascular: The heart size appears normal. There is a small pericardial effusion. Aortic atherosclerosis. Mediastinum/Nodes: Thyroid gland, trachea, and esophagus are unremarkable. Multiple calcified mediastinal, hilar, and supraclavicular lymph nodes are identified. No adenopathy. Lungs/Pleura: No pleural effusion identified. No airspace consolidation. Spiculated nodule within the left upper lobe is again noted measuring 4.5 x 2.3 cm, image 73/5. On the previous exam this was measured at 3.9 x 1.7 cm. There is surrounding interlobular septal thickening throughout the left upper lobe which is concerning for lymphangitic spread of disease. No additional pulmonary nodule or mass identified. Upper Abdomen: No acute abnormality. Musculoskeletal: Diffuse sclerotic bone metastases are again noted the appearance is not  significantly changed when compared with the previous exam. IMPRESSION: 1. Interval increase in size of spiculated nodule within the left upper lobe compatible with primary bronchogenic carcinoma. 2. There is surrounding interlobular septal  thickening throughout the left upper lobe which is concerning for lymphangitic spread of disease. 3. Diffuse sclerotic bone metastases are again noted. 4. Small pericardial effusion. 5.  Aortic Atherosclerosis (ICD10-I70.0). Electronically Signed   By: Signa Kell M.D.   On: 12/18/2022 16:39    CODE STATUS:  Code Status History     Date Active Date Inactive Code Status Order ID Comments User Context   07/20/2014 0933 07/21/2014 1200 Full Code [id number]  Julio Sicks, MD Inpatient      Advance Directive Documentation    Flowsheet Row Most Recent Value  Type of Advance Directive Healthcare Power of Attorney, Living will  Pre-existing out of facility DNR order (yellow form or pink MOST form) --  "MOST" Form in Place? --       Orders Placed This Encounter  Procedures   CBC with Differential/Platelet    Standing Status:   Future    Expected Date:   02/10/2023    Expiration Date:   01/13/2024   Comprehensive metabolic panel    Standing Status:   Future    Expected Date:   02/10/2023    Expiration Date:   01/13/2024   Magnesium    Standing Status:   Future    Expected Date:   02/10/2023    Expiration Date:   01/13/2024   Lipid panel    Standing Status:   Future    Expected Date:   02/10/2023    Expiration Date:   01/13/2024   Guardant 360    Standing Status:   Future    Number of Occurrences:   1    Expiration Date:   01/13/2024     Future Appointments  Date Time Provider Department Center  01/15/2023 11:15 AM Anabel Bene, RD [facility] None  01/27/2023 11:00 AM [facility] PHLEBOTOMIST [facility] None  01/27/2023 11:20 AM Rolene Andrades, Archie Patten, MD [facility] None      This document was completed utilizing speech recognition software.  Grammatical errors, random word insertions, pronoun errors, and incomplete sentences are an occasional consequence of this system due to software limitations, ambient noise, and hardware issues. Any formal questions or concerns about the content, text or information contained within the body of this dictation should be directly addressed to the provider for clarification.

## 2023-01-13 NOTE — Assessment & Plan Note (Signed)
Reports nausea before meals. Discussed the use of Zofran to manage this symptom. -Prescribe Zofran to be taken 30 minutes before meals and as needed up to three times a day.

## 2023-01-13 NOTE — Assessment & Plan Note (Addendum)
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 No evidence of liver or other visceral involvement. Patient has a history of secondhand smoke exposure. Family history of various cancers.   -Previously I discussed staging, diagnosis, prognosis, plan of care, treatment options with the patient and his wife.  Reviewed NCCN guidelines.  Discussed that all treatment options are palliative in nature and not curative intent, given stage IV disease.  They verbalized understanding.  - Ordered Foundation Medicine testing on the biopsy specimen to identify potential targetable mutations.  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  No other major actionable mutations.  -Given ALK mutation positivity, he would be candidate for targeted therapy.  Plan made to proceed with Lorlatinib 100 mg daily dosing.  He has had chemo education already and is scheduled to start treatments starting from 01/14/2023.  Side effects include but not limited to: increased blood pressure, edema, changes in electrolytes, increase in cholesterol and triglycerides, changes in LFTs, peripheral neuropathy, CNS/mood changes, and rare but serious risk for AV block and ILD/pneumonitis.  EKG on 01/13/2023 showed normal sinus rhythm, normal QT interval of 372 ms.  This will be monitored periodically as needed.  -MRI of the brain performed on 01/01/2023.  It was resulted today (01/13/2023).  No definite evidence of intracranial metastatic disease noted.  Patient was informed over the phone.  -I will plan to see him in 2 weeks for toxicity evaluation.  -Given bone metastatic disease, we will start him on Xgeva when he returns to clinic in 2 weeks.  Will continue this every 4 weeks thereafter.

## 2023-01-15 ENCOUNTER — Other Ambulatory Visit: Payer: Self-pay

## 2023-01-15 ENCOUNTER — Inpatient Hospital Stay: Payer: Medicare Other | Admitting: Nutrition

## 2023-01-15 NOTE — Progress Notes (Signed)
72 year old male diagnosed with lung cancer with bone metastases and followed by Dr. Arlana Pouch.  Patient is receiving Lorlatinib and  Xgeva.  Past medical history includes melanoma and sent to, squamous cell cancer, anxiety, GERD, SDH, TIA.  Medications include vitamin D, Klonopin, multivitamin, Prozac, Zofran, iron, zinc, Pepcid.  Labs on December 3 include potassium 5.4.  Height: 6 feet 1 inch. Weight: 184 pounds 6.4 ounces December 17. Usual body weight: Patient weighed 190 pounds in June 2024. BMI: 24.33.  Patient and wife present to nutrition consult.  Wife is looking for help to determine menus.  Patient was having very frequent nausea prior to meals.  This is improved when nausea medication increased.  He now takes a nausea pill prior to every meal and he is able to eat without difficulty.  He does experience early satiety.  He has tried ginger tea and states he did well with that.  Reports he usually does not eat breakfast or he will fix an egg with cheese and toast with a glass of milk.  For lunch he usually has a half of a sandwich.  Dinner may consist of [employer], small, hush puppies, and a glass of milk.  He enjoys working out in his yard and feels better when he has had some activity.  Nutrition diagnosis: Food and nutrition related knowledge deficit related to cancer and associated treatments as evidenced by no prior need for nutrition related information.  Intervention Educated to consume smaller more frequent meals/snacks with adequate calories and protein to minimize weight loss. Reviewed foods to improve nausea.  Minimize greasy and overly sweet foods if nauseated. Continue ginger tea as desired.  Take nausea medications as prescribed. Consider changing to whole fair life milk instead of 2% milk.  Add Carnation breakfast essentials.  Provided samples. Reviewed meal options.  Provided nutrition handouts.  Questions answered.  Contact information given.  Monitoring, evaluation,  goals: Patient will tolerate adequate calories and protein to minimize weight loss throughout treatment.  Next visit: Patient will contact RD for follow-up as needed.

## 2023-01-25 LAB — FUNGUS CULTURE RESULT

## 2023-01-25 LAB — FUNGAL ORGANISM REFLEX

## 2023-01-25 LAB — FUNGUS CULTURE WITH STAIN

## 2023-01-27 ENCOUNTER — Encounter: Payer: Self-pay | Admitting: Oncology

## 2023-01-27 ENCOUNTER — Inpatient Hospital Stay: Payer: Medicare Other

## 2023-01-27 ENCOUNTER — Inpatient Hospital Stay: Payer: Medicare Other | Admitting: Oncology

## 2023-01-27 VITALS — BP 128/76 | HR 82 | Temp 98.7°F | Resp 18 | Ht 73.0 in | Wt 185.1 lb

## 2023-01-27 DIAGNOSIS — C3412 Malignant neoplasm of upper lobe, left bronchus or lung: Secondary | ICD-10-CM

## 2023-01-27 DIAGNOSIS — C7951 Secondary malignant neoplasm of bone: Secondary | ICD-10-CM

## 2023-01-27 DIAGNOSIS — R110 Nausea: Secondary | ICD-10-CM | POA: Diagnosis not present

## 2023-01-27 LAB — CBC WITH DIFFERENTIAL/PLATELET
Abs Immature Granulocytes: 0.11 10*3/uL — ABNORMAL HIGH (ref 0.00–0.07)
Basophils Absolute: 0 10*3/uL (ref 0.0–0.1)
Basophils Relative: 1 %
Eosinophils Absolute: 0.1 10*3/uL (ref 0.0–0.5)
Eosinophils Relative: 4 %
HCT: 37 % — ABNORMAL LOW (ref 39.0–52.0)
Hemoglobin: 11.6 g/dL — ABNORMAL LOW (ref 13.0–17.0)
Immature Granulocytes: 3 %
Lymphocytes Relative: 19 %
Lymphs Abs: 0.7 10*3/uL (ref 0.7–4.0)
MCH: 27.8 pg (ref 26.0–34.0)
MCHC: 31.4 g/dL (ref 30.0–36.0)
MCV: 88.5 fL (ref 80.0–100.0)
Monocytes Absolute: 0.5 10*3/uL (ref 0.1–1.0)
Monocytes Relative: 14 %
Neutro Abs: 2.2 10*3/uL (ref 1.7–7.7)
Neutrophils Relative %: 59 %
Platelets: 190 10*3/uL (ref 150–400)
RBC: 4.18 MIL/uL — ABNORMAL LOW (ref 4.22–5.81)
RDW: 21.8 % — ABNORMAL HIGH (ref 11.5–15.5)
WBC: 3.6 10*3/uL — ABNORMAL LOW (ref 4.0–10.5)
nRBC: 0.8 % — ABNORMAL HIGH (ref 0.0–0.2)

## 2023-01-27 LAB — FUNGUS CULTURE WITH STAIN

## 2023-01-27 LAB — COMPREHENSIVE METABOLIC PANEL
ALT: 24 U/L (ref 0–44)
AST: 24 U/L (ref 15–41)
Albumin: 3.8 g/dL (ref 3.5–5.0)
Alkaline Phosphatase: 482 U/L — ABNORMAL HIGH (ref 38–126)
Anion gap: 7 (ref 5–15)
BUN: 20 mg/dL (ref 8–23)
CO2: 24 mmol/L (ref 22–32)
Calcium: 8.5 mg/dL — ABNORMAL LOW (ref 8.9–10.3)
Chloride: 112 mmol/L — ABNORMAL HIGH (ref 98–111)
Creatinine, Ser: 0.74 mg/dL (ref 0.61–1.24)
GFR, Estimated: >60 mL/min (ref >60)
Glucose, Bld: 145 mg/dL — ABNORMAL HIGH (ref 70–99)
Potassium: 4.3 mmol/L (ref 3.5–5.1)
Sodium: 143 mmol/L (ref 135–145)
Total Bilirubin: 0.5 mg/dL (ref 0.0–1.2)
Total Protein: 6.3 g/dL — ABNORMAL LOW (ref 6.5–8.1)

## 2023-01-27 LAB — MAGNESIUM: Magnesium: 2 mg/dL (ref 1.7–2.4)

## 2023-01-27 LAB — LIPID PANEL
Cholesterol: 163 mg/dL (ref 0–200)
HDL: 42 mg/dL (ref >40)
LDL Cholesterol: 80 mg/dL (ref 0–99)
Total CHOL/HDL Ratio: 3.9 {ratio}
Triglycerides: 204 mg/dL — ABNORMAL HIGH (ref <150)
VLDL: 41 mg/dL — ABNORMAL HIGH (ref 0–40)

## 2023-01-27 LAB — FUNGUS CULTURE RESULT

## 2023-01-27 LAB — FUNGAL ORGANISM REFLEX

## 2023-01-27 MED ORDER — DENOSUMAB 120 MG/1.7ML ~~LOC~~ SOLN
120.0000 mg | Freq: Once | SUBCUTANEOUS | Status: AC
  Administered 2023-01-27: 120 mg via SUBCUTANEOUS
  Filled 2023-01-27: qty 1.7

## 2023-01-27 NOTE — Assessment & Plan Note (Addendum)
 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 No evidence of liver or other visceral involvement. Patient has a history of secondhand smoke exposure. Family history of various cancers.   -Previously I discussed staging, diagnosis, prognosis, plan of care, treatment options with the patient and his wife.  Reviewed NCCN guidelines.  Discussed that all treatment options are palliative in nature and not curative intent, given stage IV disease.  They verbalized understanding.  - Ordered Foundation Medicine testing on the biopsy specimen to identify potential targetable mutations.  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  No other major actionable mutations.  -Given ALK mutation positivity, he would be candidate for targeted therapy.  Plan made to proceed with Lorlatinib  100 mg daily dosing, starting from 01/14/2023.  Side effects include but not limited to: increased blood pressure, edema, changes in electrolytes, increase in cholesterol and triglycerides, changes in LFTs, peripheral neuropathy, CNS/mood changes, and rare but serious risk for AV block and ILD/pneumonitis.  EKG on 01/13/2023 showed normal sinus rhythm, normal QT interval of 372 ms.  This will be monitored periodically as needed.  -MRI of the brain performed on 01/01/2023 showed no definite evidence of intracranial metastatic disease noted.    -Patient has been compliant with Lorlatinib  and tolerating it very well.  Labs today reveal no dose-limiting toxicities.  Will continue to monitor alkaline phosphatase.  -I will plan to see him in 2 weeks for toxicity evaluation.  -Given bone metastatic disease, we started him on Xgeva  from today (01/27/23).  This will be continued every 4 weeks.

## 2023-01-27 NOTE — Progress Notes (Signed)
 [facility]  ONCOLOGY CLINIC PROGRESS NOTE   Patient Care Team: Cleotilde Planas, MD as PCP - General (Family Medicine) Revankar, Jennifer SAUNDERS, MD as PCP - Cardiology (Cardiology) Cindie Ole DASEN, MD as PCP - Electrophysiology (Cardiology) Isadora Hose, MD as Consulting Physician (Pulmonary Disease) Prentis Duwaine BROCKS, RN as Oncology Nurse Navigator  PATIENT NAME: Gregg Guerrero   MR#: [id number] DOB: 06-23-1950  Date of visit: 01/27/2023  ASSESSMENT & PLAN:   Gregg Guerrero is a 72 y.o. 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 No evidence of liver or other visceral involvement. Patient has a history of secondhand smoke exposure. Family history of various cancers.   -Previously I discussed staging, diagnosis, prognosis, plan of care, treatment options with the patient and his wife.  Reviewed NCCN guidelines.  Discussed that all treatment options are palliative in nature and not curative intent, given stage IV disease.  They verbalized understanding.  - Ordered Foundation Medicine testing on the biopsy specimen to identify potential targetable mutations.  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  No other major actionable mutations.  -Given ALK mutation positivity, he would be candidate for targeted therapy.  Plan made to proceed with Lorlatinib  100 mg daily dosing, starting from 01/14/2023.  Side effects include but not limited to: increased blood pressure, edema,  changes in electrolytes, increase in cholesterol and triglycerides, changes in LFTs, peripheral neuropathy, CNS/mood changes, and rare but serious risk for AV block and ILD/pneumonitis.  EKG on 01/13/2023 showed normal sinus rhythm, normal QT interval of 372 ms.  This will be monitored periodically as needed.  -MRI of the brain performed on 01/01/2023 showed no definite evidence of intracranial metastatic disease noted.    -Patient has been compliant with Lorlatinib  and tolerating it very well.  Labs today reveal no dose-limiting toxicities.  Will continue to monitor alkaline phosphatase.  -I will plan to see him in 2 weeks for toxicity evaluation.  -Given bone metastatic disease, we started him on Xgeva  from today (01/27/23).  This will be continued every 4 weeks.   Metastasis to bone (HCC) Elevated alkaline phosphatase likely due to bone disease. Discussed the use of Xgeva  to prevent fractures and strengthen bones. -Start Xgeva  injections from today (01/27/23) and continue it monthly. --Increase calcium  supplement intake to 1200mg  daily. -Monitor for constipation, manage with increased fiber intake and MiraLAX as needed.  Nausea without vomiting -This has resolved.  He can take Zofran  as needed if recurrence of symptoms.     I reviewed lab results and outside records for this visit and discussed relevant results with the patient. Diagnosis, plan of care and treatment options were also discussed in detail with the patient. Opportunity provided to ask questions and answers provided to his apparent satisfaction. Provided instructions to call our clinic with any problems, questions or concerns prior to return visit. I recommended to continue follow-up with PCP and sub-specialists. He verbalized understanding and agreed with the plan.   NCCN guidelines have been consulted in the planning of this patient's  care.  I spent a total of 40 minutes during this encounter with the patient including  review of chart and various tests results, discussions about plan of care and coordination of care plan.   Gregg Patten, MD  01/27/2023 4:22 PM  [facility] - A DEPT OF [facility] [address] [name] [address] Dept: [phone] Dept Fax: [phone]    CHIEF COMPLAINT/ REASON FOR VISIT:   Adenocarcinoma of left lung with metastasis to the bones.  Stage IV disease.  ALK mutation positive.  Current Treatment: Lorlatinib  100 mg p.o. daily starting from 01/14/2023.  INTERVAL HISTORY:    Discussed the use of AI scribe software for clinical note transcription with the patient, who gave verbal consent to proceed.   Gregg Guerrero is here today for repeat clinical assessment.  He started taking Lorlatinib  from 01/14/2023. He reports improvement in symptoms since starting the prescribed medication. He notes a significant decrease in nausea and an increase in appetite. The patient also reports a weight gain, which is a positive sign considering his previous weight loss. Despite these improvements, the patient has not yet regained his previous weight. The patient's spouse reports that the patient's weight fluctuates daily, but overall, there seems to be a trend of weight gain. The patient is also taking calcium  supplements as recommended by the doctor. The patient's spouse reports that the patient consumes dairy products regularly, which could contribute to his calcium  intake.  I have reviewed the past medical history, past surgical history, social history and family history with the patient and they are unchanged from previous note.  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 Additionally there were scattered sclerotic bone lesions diffusely along the skeleton, and numerous calcified lymph nodes throughout the mediastinum and left hilum.    12/04/2022 PET scan   PET scan was notable for a 3.9 cm irregular spiculated mass in the left upper lobe, suspicious for bronchogenic carcinoma. 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  Whole body bone scan confirmed widespread osseous metastases. He denies any associated pain.    12/23/2022 Initial Diagnosis   Malignant neoplasm of upper lobe of left lung [facility]):  He has been under the care of his PCP for what was initially thought to be a pneumonia on CXR. Patient developed shortness of breath in September 2024 and was seen by his primary care physician.  At that time a chest x-ray was done which showed an infiltrate and question of atypical pneumonia.  The patient was treated with antibiotics however his symptoms did not improve.  A CT of the chest was ordered to further evaluate etiology for nonresolving dyspnea.   CT chest on 11/11/2022 which showed a spiculated nodule in the lingula with diffuse interstitial thickening in the left upper  lobe that was concerning for an aggressive process or malignancy.  This led to PET/CT, pulmonology referral and additional evaluation.  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 Per his wife the melanoma was low grade and did not require any further  treatment after excision.    12/23/2022 Procedure   Patient had initial consultation in pulmonology clinic on 12/17/2022 and underwent navigational bronchoscopy, EBUS and biopsy of left upper lobe lung nodule on 12/23/2022, performed by Dr. Malka.  On EBUS, hilar and mediastinal lymph node stations were examined but no lymph nodes were encountered.  This was suspected to be secondary to severe calcification noted on CT scan.   12/23/2022 Pathology Results   Pathology from left upper lobe lung nodule biopsy came back positive for adenocarcinoma.  Brushings from left upper lobe and lavage showed rare atypical cells.   12/30/2022 Cancer Staging   Staging form: Lung, AJCC 8th Edition - Clinical stage from 12/30/2022: Stage IVB (cT2a, cN0, cM1c) - Signed by Autumn Millman, MD on 12/30/2022 Stage prefix: Initial diagnosis    Miscellaneous   Request submitted for Foundation Medicine testing on the specimen and Guardant 360 (liquid biopsy) for any actionable mutations and also for PD-L1 testing.  Treatment decisions to be made pending these results.   01/05/2023 Pathology Results   Foundation One CDx: ALK + EML4-ALK fusion (Variant 1)  PD-L1 TPS 5%  DNMT3A P904L mutation noted (unknown significance in his tumor type).    01/06/2023 -  Chemotherapy   Patient is on Treatment Plan : LUNG NSCLC Lorlatinib  [id number]         REVIEW OF SYSTEMS:   Review of Systems - Oncology  All other pertinent systems were reviewed with the patient and are negative.  ALLERGIES: He has no known allergies.  MEDICATIONS:  Current Outpatient Medications  Medication Sig Dispense Refill   atorvastatin  (LIPITOR) 10 MG tablet Take 10 mg by mouth at bedtime.     benzonatate (TESSALON) 200 MG capsule Take 200 mg by mouth 3 (three) times daily as needed for cough.     Cholecalciferol (DIALYVITE VITAMIN D 5000) 125 MCG (5000 UT) capsule Take 1,500 Units by mouth every Saturday.     clonazePAM  (KLONOPIN ) 1 MG tablet Take 1  mg by mouth at bedtime.     clopidogrel  (PLAVIX ) 75 MG tablet Take 75 mg by mouth daily.     FERROUS BISGLYCINATE CHELATE PO Take 25 mg by mouth 1 day or 1 dose.     Ferrous Sulfate (IRON) 325 (65 FE) MG TABS Take 325 mg by mouth daily.     fluticasone (FLONASE) 50 MCG/ACT nasal spray Place 1 spray into both nostrils daily as needed for allergies.     latanoprost (XALATAN) 0.005 % ophthalmic solution Place 1 drop into both eyes at bedtime.     loratadine  (CLARITIN ) 10 MG tablet Take 10 mg by mouth at bedtime.     lorlatinib  (LORBRENA ) 100 MG tablet Take 1 tablet (100 mg total) by mouth daily. Swallow tablets whole. Do not chew, crush or split tablets. 28 tablet 5   MAGNESIUM PO Take 1 tablet by mouth daily.     Multiple Vitamin (MULTIVITAMIN) tablet Take 1 tablet by mouth daily.     omeprazole  (PRILOSEC) 20 MG capsule Take 1 capsule (20 mg total) by mouth daily. 30 capsule 11   traZODone (DESYREL) 50 MG tablet Take 25 mg by mouth  at bedtime.     UNABLE TO FIND Take 30 mg by mouth daily. Med Name: Zinc  Chelated     ondansetron  (ZOFRAN -ODT) 8 MG disintegrating tablet Take 1 tablet (8 mg total) by mouth every 8 (eight) hours as needed for nausea or vomiting. (Patient not taking: Reported on 01/27/2023) 60 tablet 3   No current facility-administered medications for this visit.     VITALS:   Blood pressure 128/76, pulse 82, temperature 98.7 F (37.1 C), resp. rate 18, height 6' 1 (1.854 m), weight 185 lb 1.6 oz (84 kg), SpO2 97%.  Wt Readings from Last 3 Encounters:  01/27/23 185 lb 1.6 oz (84 kg)  01/13/23 184 lb 6.4 oz (83.6 kg)  12/31/22 185 lb 9.6 oz (84.2 kg)    Body mass index is 24.42 kg/m.  Performance status (ECOG): 1 - Symptomatic but completely ambulatory  PHYSICAL EXAM:   Physical Exam Constitutional:      General: He is not in acute distress.    Appearance: Normal appearance.  HENT:     Head: Normocephalic and atraumatic.  Eyes:     General: No scleral icterus.     Conjunctiva/sclera: Conjunctivae normal.  Cardiovascular:     Rate and Rhythm: Normal rate and regular rhythm.     Heart sounds: Normal heart sounds.  Pulmonary:     Effort: Pulmonary effort is normal.     Breath sounds: Normal breath sounds.  Abdominal:     General: There is no distension.  Musculoskeletal:     Right lower leg: No edema.     Left lower leg: No edema.  Neurological:     General: No focal deficit present.     Mental Status: He is alert and oriented to person, place, and time.  Psychiatric:        Mood and Affect: Mood normal.        Behavior: Behavior normal.        Thought Content: Thought content normal.       LABORATORY DATA:   I have reviewed the data as listed.  Results for orders placed or performed in visit on 01/27/23  Lipid panel  Result Value Ref Range   Cholesterol 163 0 - 200 mg/dL   Triglycerides 795 (H) <150 mg/dL   HDL 42 >59 mg/dL   Total CHOL/HDL Ratio 3.9 RATIO   VLDL 41 (H) 0 - 40 mg/dL   LDL Cholesterol 80 0 - 99 mg/dL  Magnesium  Result Value Ref Range   Magnesium 2.0 1.7 - 2.4 mg/dL  Comprehensive metabolic panel  Result Value Ref Range   Sodium 143 135 - 145 mmol/L   Potassium 4.3 3.5 - 5.1 mmol/L   Chloride 112 (H) 98 - 111 mmol/L   CO2 24 22 - 32 mmol/L   Glucose, Bld 145 (H) 70 - 99 mg/dL   BUN 20 8 - 23 mg/dL   Creatinine, Ser 9.25 0.61 - 1.24 mg/dL   Calcium  8.5 (L) 8.9 - 10.3 mg/dL   Total Protein 6.3 (L) 6.5 - 8.1 g/dL   Albumin 3.8 3.5 - 5.0 g/dL   AST 24 15 - 41 U/L   ALT 24 0 - 44 U/L   Alkaline Phosphatase 482 (H) 38 - 126 U/L   Total Bilirubin 0.5 0.0 - 1.2 mg/dL   GFR, Estimated >39 >39 mL/min   Anion gap 7 5 - 15  CBC with Differential/Platelet  Result Value Ref Range   WBC 3.6 (L) 4.0 - 10.5 K/uL  RBC 4.18 (L) 4.22 - 5.81 MIL/uL   Hemoglobin 11.6 (L) 13.0 - 17.0 g/dL   HCT 62.9 (L) 60.9 - 47.9 %   MCV 88.5 80.0 - 100.0 fL   MCH 27.8 26.0 - 34.0 pg   MCHC 31.4 30.0 - 36.0 g/dL   RDW 78.1 (H) 88.4 -  15.5 %   Platelets 190 150 - 400 K/uL   nRBC 0.8 (H) 0.0 - 0.2 %   Neutrophils Relative % 59 %   Neutro Abs 2.2 1.7 - 7.7 K/uL   Lymphocytes Relative 19 %   Lymphs Abs 0.7 0.7 - 4.0 K/uL   Monocytes Relative 14 %   Monocytes Absolute 0.5 0.1 - 1.0 K/uL   Eosinophils Relative 4 %   Eosinophils Absolute 0.1 0.0 - 0.5 K/uL   Basophils Relative 1 %   Basophils Absolute 0.0 0.0 - 0.1 K/uL   Immature Granulocytes 3 %   Abs Immature Granulocytes 0.11 (H) 0.00 - 0.07 K/uL      RADIOGRAPHIC STUDIES:  I have personally reviewed the radiological images as listed and agree with the findings in the report.  MR Brain W Wo Contrast Result Date: 01/13/2023 CLINICAL DATA:  Metastatic disease evaluation. Recent lung cancer diagnosis in November 2024. EXAM: MRI HEAD WITHOUT AND WITH CONTRAST TECHNIQUE: Multiplanar, multiecho pulse sequences of the brain and surrounding structures were obtained without and with intravenous contrast. CONTRAST:  8mL GADAVIST  GADOBUTROL  1 MMOL/ML IV SOLN COMPARISON:  MRI brain 06/16/2014. FINDINGS: Brain: No acute infarct or hemorrhage. 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 2 mm punctate focus of abnormal enhancement in the left posterior parietal periventricular white matter (axial image 104 series 16), possibly vascular in etiology based on the sagittal postcontrast images (image 28 series 18). No associated vasogenic edema. No hydrocephalus or extra-axial collection. No mass effect or midline shift. Vascular: Normal flow voids and vessel enhancement. Skull and upper cervical spine: Prior right parietal burr hole craniotomy. Otherwise normal marrow signal and enhancement. Sinuses/Orbits: No acute findings. Other: None. IMPRESSION: 1. 2 mm punctate focus of abnormal enhancement in the left posterior parietal periventricular white matter, possibly vascular in etiology based on the sagittal postcontrast images. No  associated vasogenic edema. Recommend attention on follow-up. 2. No other evidence of intracranial metastatic disease. 3. 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 Electronically Signed   By: Ryan Chess M.D.   On: 01/13/2023 11:34    CODE STATUS:  Code Status History     Date Active Date Inactive Code Status Order ID Comments User Context   07/20/2014 0933 07/21/2014 1200 Full Code [id number]  Louis Shove, MD Inpatient      Advance Directive Documentation    Flowsheet Row Most Recent Value  Type of Advance Directive Healthcare Power of Attorney, Living will  Pre-existing out of facility DNR order (yellow form or pink MOST form) --  MOST Form in Place? --       No orders of the defined types were placed in this encounter.    Future Appointments  Date Time Provider Department Center  02/10/2023  2:30 PM [facility] PHLEBOTOMIST [facility] None  02/10/2023  3:00 PM Kilah Drahos, MD [facility] None  02/24/2023  1:00 PM [facility] PHLEBOTOMIST [facility] None  02/24/2023  2:00 PM [facility] INFUSION [facility] None      This document was completed utilizing speech recognition software. Grammatical errors, random word insertions, pronoun errors, and incomplete  sentences are an occasional consequence of this system due to software limitations, ambient noise, and hardware issues. Any formal questions or concerns about the content, text or information contained within the body of this dictation should be directly addressed to the provider for clarification.

## 2023-01-27 NOTE — Patient Instructions (Signed)
 Denosumab  Injection (Oncology) What is this medication? DENOSUMAB  (den oh SUE mab) prevents weakened bones caused by cancer. It may also be used to treat noncancerous bone tumors that cannot be removed by surgery. It can also be used to treat high calcium  levels in the blood caused by cancer. It works by blocking a protein that causes bones to break down quickly. This slows down the release of calcium  from bones, which lowers calcium  levels in your blood. It also makes your bones stronger and less likely to break (fracture). This medicine may be used for other purposes; ask your health care provider or pharmacist if you have questions. COMMON BRAND NAME(S): XGEVA  What should I tell my care team before I take this medication? They need to know if you have any of these conditions: Dental disease Having surgery or tooth extraction Infection Kidney disease Low levels of calcium  or vitamin D  in the blood Malnutrition On hemodialysis Skin conditions or sensitivity Thyroid  or parathyroid disease An unusual reaction to denosumab , other medications, foods, dyes, or preservatives Pregnant or trying to get pregnant Breast-feeding How should I use this medication? This medication is for injection under the skin. It is given by your care team in a hospital or clinic setting. A special MedGuide will be given to you before each treatment. Be sure to read this information carefully each time. Talk to your care team about the use of this medication in children. While it may be prescribed for children as young as 13 years for selected conditions, precautions do apply. Overdosage: If you think you have taken too much of this medicine contact a poison control center or emergency room at once. NOTE: This medicine is only for you. Do not share this medicine with others. What if I miss a dose? Keep appointments for follow-up doses. It is important not to miss your dose. Call your care team if you are unable to  keep an appointment. What may interact with this medication? Do not take this medication with any of the following: Other medications containing denosumab  This medication may also interact with the following: Medications that lower your chance of fighting infection Steroid medications, such as prednisone  or cortisone This list may not describe all possible interactions. Give your health care provider a list of all the medicines, herbs, non-prescription drugs, or dietary supplements you use. Also tell them if you smoke, drink alcohol, or use illegal drugs. Some items may interact with your medicine. What should I watch for while using this medication? Your condition will be monitored carefully while you are receiving this medication. You may need blood work while taking this medication. This medication may increase your risk of getting an infection. Call your care team for advice if you get a fever, chills, sore throat, or other symptoms of a cold or flu. Do not treat yourself. Try to avoid being around people who are sick. You should make sure you get enough calcium  and vitamin D  while you are taking this medication, unless your care team tells you not to. Discuss the foods you eat and the vitamins you take with your care team. Some people who take this medication have severe bone, joint, or muscle pain. This medication may also increase your risk for jaw problems or a broken thigh bone. Tell your care team right away if you have severe pain in your jaw, bones, joints, or muscles. Tell your care team if you have any pain that does not go away or that gets worse. Talk  to your care team if you may be pregnant. Serious birth defects can occur if you take this medication during pregnancy and for 5 months after the last dose. You will need a negative pregnancy test before starting this medication. Contraception is recommended while taking this medication and for 5 months after the last dose. Your care team  can help you find the option that works for you. What side effects may I notice from receiving this medication? Side effects that you should report to your care team as soon as possible: Allergic reactions--skin rash, itching, hives, swelling of the face, lips, tongue, or throat Bone, joint, or muscle pain Low calcium  level--muscle pain or cramps, confusion, tingling, or numbness in the hands or feet Osteonecrosis of the jaw--pain, swelling, or redness in the mouth, numbness of the jaw, poor healing after dental work, unusual discharge from the mouth, visible bones in the mouth Side effects that usually do not require medical attention (report to your care team if they continue or are bothersome): Cough Diarrhea Fatigue Headache Nausea This list may not describe all possible side effects. Call your doctor for medical advice about side effects. You may report side effects to FDA at 1-800-FDA-1088. Where should I keep my medication? This medication is given in a hospital or clinic. It will not be stored at home. NOTE: This sheet is a summary. It may not cover all possible information. If you have questions about this medicine, talk to your doctor, pharmacist, or health care provider.  2024 Elsevier/Gold Standard (2021-06-05 00:00:00)

## 2023-01-27 NOTE — Assessment & Plan Note (Addendum)
 Elevated alkaline phosphatase likely due to bone disease. Discussed the use of Xgeva  to prevent fractures and strengthen bones. -Start Xgeva  injections from today (01/27/23) and continue it monthly. --Increase calcium  supplement intake to 1200mg  daily. -Monitor for constipation, manage with increased fiber intake and MiraLAX as needed.

## 2023-01-27 NOTE — Assessment & Plan Note (Addendum)
-  This has resolved.  He can take Zofran as needed if recurrence of symptoms.

## 2023-02-02 ENCOUNTER — Other Ambulatory Visit (HOSPITAL_COMMUNITY): Payer: Self-pay

## 2023-02-02 NOTE — Progress Notes (Signed)
 Specialty Pharmacy Refill Coordination Note  Gregg Guerrero is a 73 y.o. male contacted today regarding refills of specialty medication(s) Lorlatinib  (LORBRENA )   Patient requested Delivery   Delivery date: 02/11/23   Verified address: [address] [name] [address]   Medication will be filled on 02/10/23.

## 2023-02-02 NOTE — Progress Notes (Signed)
 Specialty Pharmacy Ongoing Clinical Assessment Note  Gregg Guerrero is a 73 y.o. male who is being followed by the specialty pharmacy service for RxSp Oncology   Patient's specialty medication(s) reviewed today: Lorlatinib  (LORBRENA )   Missed doses in the last 4 weeks: 0   Patient/Caregiver did not have any additional questions or concerns.   Therapeutic benefit summary: Patient is achieving benefit   Adverse events/side effects summary: No adverse events/side effects   Patient's therapy is appropriate to: Continue    Goals Addressed             This Visit's Progress    Maintain optimal adherence to therapy   No change    Patient is initiating therapy. Patient will maintain adherence         Follow up:  3 months  [name] Dolly Specialty Pharmacist

## 2023-02-05 ENCOUNTER — Encounter: Payer: Self-pay | Admitting: Oncology

## 2023-02-05 ENCOUNTER — Telehealth: Payer: Self-pay

## 2023-02-05 ENCOUNTER — Other Ambulatory Visit (HOSPITAL_COMMUNITY): Payer: Self-pay

## 2023-02-05 NOTE — Telephone Encounter (Addendum)
 Oral Oncology Patient Advocate Encounter   Was successful in securing patient a grant from [employer] ([employer]) to provide copayment coverage for Lorbrena .  This will keep the out of pocket expense at $0.      The billing information is as follows and has been shared with [facility].   RxBin: [id number] PCN:  [id number] Member ID: [id number] Group ID: [id number] Dates of Eligibility: 01/29/23 through 02/28/23   Morene Potters, CPhT Oncology Pharmacy Patient Advocate  [facility]  [phone] (phone) [phone] (fax) 02/05/2023 2:58 PM

## 2023-02-06 ENCOUNTER — Encounter: Payer: Self-pay | Admitting: Oncology

## 2023-02-09 LAB — ACID FAST CULTURE WITH REFLEXED SENSITIVITIES (MYCOBACTERIA)
Acid Fast Culture: NEGATIVE
Acid Fast Culture: NEGATIVE

## 2023-02-10 ENCOUNTER — Encounter: Payer: Self-pay | Admitting: Oncology

## 2023-02-10 ENCOUNTER — Inpatient Hospital Stay: Payer: Medicare Other | Admitting: Oncology

## 2023-02-10 ENCOUNTER — Other Ambulatory Visit: Payer: Self-pay | Admitting: Oncology

## 2023-02-10 ENCOUNTER — Other Ambulatory Visit: Payer: Self-pay

## 2023-02-10 ENCOUNTER — Inpatient Hospital Stay: Payer: Medicare Other | Attending: Oncology

## 2023-02-10 VITALS — BP 138/72 | HR 83 | Temp 97.9°F | Resp 18 | Ht 73.0 in | Wt 191.7 lb

## 2023-02-10 DIAGNOSIS — C3412 Malignant neoplasm of upper lobe, left bronchus or lung: Secondary | ICD-10-CM

## 2023-02-10 DIAGNOSIS — R110 Nausea: Secondary | ICD-10-CM | POA: Insufficient documentation

## 2023-02-10 DIAGNOSIS — C7951 Secondary malignant neoplasm of bone: Secondary | ICD-10-CM

## 2023-02-10 DIAGNOSIS — R748 Abnormal levels of other serum enzymes: Secondary | ICD-10-CM | POA: Diagnosis not present

## 2023-02-10 DIAGNOSIS — Z7722 Contact with and (suspected) exposure to environmental tobacco smoke (acute) (chronic): Secondary | ICD-10-CM | POA: Insufficient documentation

## 2023-02-10 DIAGNOSIS — Z9221 Personal history of antineoplastic chemotherapy: Secondary | ICD-10-CM | POA: Insufficient documentation

## 2023-02-10 LAB — CBC WITH DIFFERENTIAL/PLATELET
Abs Immature Granulocytes: 0.03 10*3/uL (ref 0.00–0.07)
Basophils Absolute: 0 10*3/uL (ref 0.0–0.1)
Basophils Relative: 1 %
Eosinophils Absolute: 0.2 10*3/uL (ref 0.0–0.5)
Eosinophils Relative: 5 %
HCT: 37.4 % — ABNORMAL LOW (ref 39.0–52.0)
Hemoglobin: 11.9 g/dL — ABNORMAL LOW (ref 13.0–17.0)
Immature Granulocytes: 1 %
Lymphocytes Relative: 22 %
Lymphs Abs: 0.8 10*3/uL (ref 0.7–4.0)
MCH: 28.7 pg (ref 26.0–34.0)
MCHC: 31.8 g/dL (ref 30.0–36.0)
MCV: 90.1 fL (ref 80.0–100.0)
Monocytes Absolute: 0.5 10*3/uL (ref 0.1–1.0)
Monocytes Relative: 13 %
Neutro Abs: 2.1 10*3/uL (ref 1.7–7.7)
Neutrophils Relative %: 58 %
Platelets: 164 10*3/uL (ref 150–400)
RBC: 4.15 MIL/uL — ABNORMAL LOW (ref 4.22–5.81)
RDW: 23.2 % — ABNORMAL HIGH (ref 11.5–15.5)
WBC: 3.5 10*3/uL — ABNORMAL LOW (ref 4.0–10.5)
nRBC: 0 % (ref 0.0–0.2)

## 2023-02-10 LAB — COMPREHENSIVE METABOLIC PANEL
ALT: 21 U/L (ref 0–44)
AST: 25 U/L (ref 15–41)
Albumin: 4 g/dL (ref 3.5–5.0)
Alkaline Phosphatase: 316 U/L — ABNORMAL HIGH (ref 38–126)
Anion gap: 2 — ABNORMAL LOW (ref 5–15)
BUN: 19 mg/dL (ref 8–23)
CO2: 25 mmol/L (ref 22–32)
Calcium: 8.7 mg/dL — ABNORMAL LOW (ref 8.9–10.3)
Chloride: 112 mmol/L — ABNORMAL HIGH (ref 98–111)
Creatinine, Ser: 0.83 mg/dL (ref 0.61–1.24)
GFR, Estimated: >60 mL/min (ref >60)
Glucose, Bld: 114 mg/dL — ABNORMAL HIGH (ref 70–99)
Potassium: 4.7 mmol/L (ref 3.5–5.1)
Sodium: 139 mmol/L (ref 135–145)
Total Bilirubin: 0.4 mg/dL (ref 0.0–1.2)
Total Protein: 6.5 g/dL (ref 6.5–8.1)

## 2023-02-10 LAB — MAGNESIUM: Magnesium: 2.1 mg/dL (ref 1.7–2.4)

## 2023-02-10 NOTE — Assessment & Plan Note (Addendum)
 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 No evidence of liver or other visceral involvement. Patient has a history of secondhand smoke exposure. Family history of various cancers.   -Previously I discussed staging, diagnosis, prognosis, plan of care, treatment options with the patient and his wife.  Reviewed NCCN guidelines.  Discussed that all treatment options are palliative in nature and not curative intent, given stage IV disease.  They verbalized understanding.  - Ordered Foundation Medicine testing on the biopsy specimen to identify potential targetable mutations.  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  No other major actionable mutations.  -Given ALK mutation positivity, he would be candidate for targeted therapy.  Plan made to proceed with Lorlatinib  100 mg daily dosing, starting from 01/14/2023.  Side effects include but not limited to: increased blood pressure, edema, changes in electrolytes, increase in cholesterol and triglycerides, changes in LFTs, peripheral neuropathy, CNS/mood changes, and rare but serious risk for AV block and ILD/pneumonitis.  EKG on 01/13/2023 showed normal sinus rhythm, normal QT interval of 372 ms.  This will be monitored periodically as needed.  -MRI of the brain performed on 01/01/2023 showed no definite evidence of intracranial metastatic disease noted.    -Patient has been compliant with Lorlatinib  and tolerating it very well.  Labs today reveal no dose-limiting toxicities.  Alkaline phosphatase decreased from 482 to 316, indicating a positive response to treatment. Calcium  levels improving (8.7).  - Discussed maintaining calcium  intake and potential medication side effects.   - Addressed travel plans and infection risk monitoring. Reassured about low TB risk from biopsy cultures.  - Continue  current medication regimen  - Administer Xgeva  shot in two weeks  - Continue calcium  carbonate 500-600 mg twice daily   - Consider travel plans based on future WBC and neutrophil levels - Reassure about low TB risk from biopsy cultures  -Given bone metastatic disease, we started him on Xgeva  from 01/27/23.  This will be continued every 4 weeks.

## 2023-02-10 NOTE — Progress Notes (Signed)
  [facility]  ONCOLOGY CLINIC PROGRESS NOTE   Patient Care Team: Cleotilde Planas, MD as PCP - General (Family Medicine) Revankar, Jennifer SAUNDERS, MD as PCP - Cardiology (Cardiology) Cindie Ole DASEN, MD as PCP - Electrophysiology (Cardiology) Isadora Hose, MD as Consulting Physician (Pulmonary Disease) Prentis Duwaine BROCKS, RN as Oncology Nurse Navigator  PATIENT NAME: Gregg Guerrero   MR#: [id number] DOB: 1950/12/09  Date of visit: 02/10/2023  ASSESSMENT & PLAN:   Gregg Guerrero is a 73 y.o. 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 No evidence of liver or other visceral involvement. Patient has a history of secondhand smoke exposure. Family history of various cancers.   -Previously I discussed staging, diagnosis, prognosis, plan of care, treatment options with the patient and his wife.  Reviewed NCCN guidelines.  Discussed that all treatment options are palliative in nature and not curative intent, given stage IV disease.  They verbalized understanding.  - Ordered Foundation Medicine testing on the biopsy specimen to identify potential targetable mutations.  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  No other major actionable mutations.  -Given ALK mutation positivity, he would be candidate for targeted therapy.  Plan made to proceed with Lorlatinib  100 mg daily dosing, starting from 01/14/2023.  Side effects include but not limited to: increased blood pressure, edema,  changes in electrolytes, increase in cholesterol and triglycerides, changes in LFTs, peripheral neuropathy, CNS/mood changes, and rare but serious risk for AV block and ILD/pneumonitis.  EKG on 01/13/2023 showed normal sinus rhythm, normal QT interval of 372 ms.  This will be monitored periodically as needed.  -MRI of the brain performed on 01/01/2023 showed no definite evidence of intracranial metastatic disease noted.    -Patient has been compliant with Lorlatinib  and tolerating it very well.  Labs today reveal no dose-limiting toxicities.  Alkaline phosphatase decreased from 482 to 316, indicating a positive response to treatment. Calcium  levels improving (8.7).  - Discussed maintaining calcium  intake and potential medication side effects.   - Addressed travel plans and infection risk monitoring. Reassured about low TB risk from biopsy cultures.  - Continue current medication regimen  - Administer Xgeva  shot in two weeks  - Continue calcium  carbonate 500-600 mg twice daily   - Consider travel plans based on future WBC and neutrophil levels - Reassure about low TB risk from biopsy cultures  -Given bone metastatic disease, we started him on Xgeva  from 01/27/23.  This will be continued every 4 weeks.   Metastasis to bone (HCC) Elevated alkaline phosphatase likely due to bone disease. Discussed the use of Xgeva  to prevent fractures and strengthen bones. -Started Xgeva  injections from 01/27/23 and continue it monthly. --Increase calcium  supplement intake to 1200mg  daily. -Monitor for constipation, manage with increased fiber intake and MiraLAX as needed.  Nausea without vomiting -This has resolved.  He can take Zofran  as needed if recurrence of symptoms.    I will see him in 2 weeks when he comes for Xgeva  injection.  Will repeat labs on return visit.  I reviewed lab results and outside records for this visit and discussed relevant results with the patient. Diagnosis, plan of care and  treatment options were also discussed in detail with the patient. Opportunity provided to ask questions and answers provided to his apparent satisfaction. Provided instructions to call our clinic with any problems, questions or concerns prior to return visit. I recommended to continue follow-up with PCP and sub-specialists. He verbalized understanding and agreed with the plan.   NCCN guidelines have been consulted in the planning of this patient's care.  I spent a total of 30 minutes during this encounter with the patient including review of chart and various tests results, discussions about plan of care and coordination of care plan.   Chinita Patten, MD  02/10/2023 3:42 PM  [facility] - A DEPT OF [facility] [id number] [name] [address] Dept: [phone] Dept Fax: [phone]    CHIEF COMPLAINT/ REASON FOR VISIT:   Adenocarcinoma of left lung with metastasis to the bones.  Stage IV disease.  ALK mutation positive.  Current Treatment: Lorlatinib  100 mg p.o. daily starting from 01/14/2023.  INTERVAL HISTORY:    Discussed the use of AI scribe software for clinical note transcription with the patient, who gave verbal consent to proceed.   Gregg Guerrero is here today for repeat clinical assessment.  He started taking Lorlatinib  from 01/14/2023. He reports improvement in symptoms since starting the prescribed medication. He notes a significant decrease in nausea and an increase in appetite. The patient also reports a weight gain, which is a positive sign considering his previous weight loss.  He has also been receiving treatment with Xgeva  and has been taking calcium  supplements. He reports no known side effects from his medication. He has been experiencing an improvement in his appetite and has not had any episodes of nausea or vomiting recently. The patient is also planning a trip and has expressed  concerns about his health during travel.  I have reviewed the past medical history, past surgical history, social history and family history with the patient and they are unchanged from previous note.  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 Additionally there were scattered sclerotic bone lesions diffusely along the skeleton, and numerous calcified lymph nodes throughout the mediastinum and left hilum.    12/04/2022 PET scan   PET scan was notable for a 3.9 cm irregular spiculated mass in the left upper lobe, suspicious for bronchogenic carcinoma. 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  Whole body bone scan confirmed widespread osseous metastases. He denies any associated pain.    12/23/2022 Initial Diagnosis   Malignant neoplasm of upper lobe of left lung [facility]):  He has been under the care of his PCP for what was initially thought to be a  pneumonia on CXR. Patient developed shortness of breath in September 2024 and was seen by his primary care physician.  At that time a chest x-ray was done which showed an infiltrate and question of atypical pneumonia.  The patient was treated with antibiotics however his symptoms did not improve.  A CT of the chest was ordered to further evaluate etiology for  nonresolving dyspnea.   CT chest on 11/11/2022 which showed a spiculated nodule in the lingula with diffuse interstitial thickening in the left upper lobe that was concerning for an aggressive process or malignancy.  This led to PET/CT, pulmonology referral and additional evaluation.  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 Per his wife the melanoma was low grade and did not require any further treatment after excision.    12/23/2022 Procedure   Patient had initial consultation in pulmonology clinic on 12/17/2022 and underwent navigational bronchoscopy, EBUS and biopsy of left upper lobe lung nodule on 12/23/2022, performed by Dr. Malka.  On EBUS, hilar and mediastinal lymph node stations were examined but no lymph nodes were encountered.  This was suspected to be secondary to severe calcification noted on CT scan.   12/23/2022 Pathology Results   Pathology from left upper lobe lung nodule biopsy came back positive for adenocarcinoma.  Brushings from left upper lobe and lavage showed rare atypical cells.   12/30/2022 Cancer Staging   Staging form: Lung, AJCC 8th Edition - Clinical stage from 12/30/2022: Stage IVB (cT2a, cN0, cM1c) - Signed by Autumn Millman, MD on 12/30/2022 Stage prefix: Initial diagnosis    Miscellaneous   Request submitted for Foundation Medicine testing on the specimen and Guardant 360 (liquid biopsy) for any actionable mutations and also for PD-L1 testing.  Treatment decisions to be made pending these results.   01/05/2023 Pathology Results   Foundation One CDx: ALK + EML4-ALK fusion (Variant 1)  PD-L1 TPS 5%  DNMT3A P904L mutation noted (unknown significance in his tumor type).    01/06/2023 -  Chemotherapy   Patient is on Treatment Plan : LUNG NSCLC Lorlatinib  q28d         REVIEW OF SYSTEMS:   Review of Systems - Oncology  All other pertinent systems were reviewed with the  patient and are negative.  ALLERGIES: He has no known allergies.  MEDICATIONS:  Current Outpatient Medications  Medication Sig Dispense Refill   atorvastatin  (LIPITOR) 10 MG tablet Take 10 mg by mouth at bedtime.     Calcium  Carbonate-Vit D-Min (CALCIUM  1200 PO) Take 750 mg by mouth in the morning, at noon, and at bedtime. Patient is working towards increasing to 1200 mgs 5 times a day     calcium  citrate (CALCITRATE - DOSED IN MG ELEMENTAL CALCIUM ) 950 (200 Ca) MG tablet Take 200 mg of elemental calcium  by mouth daily. Taking 750 mgs 3 times a day, trying to increase to 1200 mgs 5 times a day.     Cholecalciferol (DIALYVITE VITAMIN D 5000) 125 MCG (5000 UT) capsule Take 1,500 Units by mouth every Saturday.     clonazePAM  (KLONOPIN ) 1 MG tablet Take 1 mg by mouth at bedtime.     clopidogrel  (PLAVIX ) 75 MG tablet Take 75 mg by mouth daily.     Ferrous Sulfate (IRON) 325 (65 FE) MG TABS Take 325 mg by mouth daily.     fluticasone (FLONASE) 50 MCG/ACT nasal spray Place 1 spray into both nostrils daily  as needed for allergies.     latanoprost (XALATAN) 0.005 % ophthalmic solution Place 1 drop into both eyes at bedtime.     lorlatinib  (LORBRENA ) 100 MG tablet Take 1 tablet (100 mg total) by mouth daily. Swallow tablets whole. Do not chew, crush or split tablets. 28 tablet 5   MAGNESIUM PO Take 1 tablet by mouth daily.     Multiple Vitamin (MULTIVITAMIN) tablet Take 1 tablet by mouth daily.     omeprazole  (PRILOSEC) 20 MG capsule Take 1 capsule (20 mg total) by mouth daily. 30 capsule 11   traZODone (DESYREL) 50 MG tablet Take 25 mg by mouth at bedtime.     benzonatate (TESSALON) 200 MG capsule Take 200 mg by mouth 3 (three) times daily as needed for cough. (Patient not taking: Reported on 02/10/2023)     loratadine  (CLARITIN ) 10 MG tablet Take 10 mg by mouth at bedtime. (Patient not taking: Reported on 02/10/2023)     ondansetron  (ZOFRAN -ODT) 8 MG disintegrating tablet Take 1 tablet (8 mg total) by  mouth every 8 (eight) hours as needed for nausea or vomiting. (Patient not taking: Reported on 02/10/2023) 60 tablet 3   No current facility-administered medications for this visit.     VITALS:   Blood pressure 138/72, pulse 83, temperature 97.9 F (36.6 C), temperature source Oral, resp. rate 18, height 6' 1 (1.854 m), weight 191 lb 11.2 oz (87 kg), SpO2 97%.  Wt Readings from Last 3 Encounters:  02/10/23 191 lb 11.2 oz (87 kg)  01/27/23 185 lb 1.6 oz (84 kg)  01/13/23 184 lb 6.4 oz (83.6 kg)    Body mass index is 25.29 kg/m.  Performance status (ECOG): 1 - Symptomatic but completely ambulatory  PHYSICAL EXAM:   Physical Exam Constitutional:      General: He is not in acute distress.    Appearance: Normal appearance.  HENT:     Head: Normocephalic and atraumatic.  Eyes:     General: No scleral icterus.    Conjunctiva/sclera: Conjunctivae normal.  Cardiovascular:     Rate and Rhythm: Normal rate and regular rhythm.     Heart sounds: Normal heart sounds.  Pulmonary:     Effort: Pulmonary effort is normal.     Breath sounds: Normal breath sounds.  Abdominal:     General: There is no distension.  Musculoskeletal:     Right lower leg: No edema.     Left lower leg: No edema.  Neurological:     General: No focal deficit present.     Mental Status: He is alert and oriented to person, place, and time.  Psychiatric:        Mood and Affect: Mood normal.        Behavior: Behavior normal.        Thought Content: Thought content normal.       LABORATORY DATA:   I have reviewed the data as listed.  Results for orders placed or performed in visit on 02/10/23  Magnesium  Result Value Ref Range   Magnesium 2.1 1.7 - 2.4 mg/dL  Comprehensive metabolic panel  Result Value Ref Range   Sodium 139 135 - 145 mmol/L   Potassium 4.7 3.5 - 5.1 mmol/L   Chloride 112 (H) 98 - 111 mmol/L   CO2 25 22 - 32 mmol/L   Glucose, Bld 114 (H) 70 - 99 mg/dL   BUN 19 8 - 23 mg/dL    Creatinine, Ser 9.16 0.61 - 1.24 mg/dL  Calcium  8.7 (L) 8.9 - 10.3 mg/dL   Total Protein 6.5 6.5 - 8.1 g/dL   Albumin 4.0 3.5 - 5.0 g/dL   AST 25 15 - 41 U/L   ALT 21 0 - 44 U/L   Alkaline Phosphatase 316 (H) 38 - 126 U/L   Total Bilirubin 0.4 0.0 - 1.2 mg/dL   GFR, Estimated >39 >39 mL/min   Anion gap 2 (L) 5 - 15  CBC with Differential/Platelet  Result Value Ref Range   WBC 3.5 (L) 4.0 - 10.5 K/uL   RBC 4.15 (L) 4.22 - 5.81 MIL/uL   Hemoglobin 11.9 (L) 13.0 - 17.0 g/dL   HCT 62.5 (L) 60.9 - 47.9 %   MCV 90.1 80.0 - 100.0 fL   MCH 28.7 26.0 - 34.0 pg   MCHC 31.8 30.0 - 36.0 g/dL   RDW 76.7 (H) 88.4 - 84.4 %   Platelets 164 150 - 400 K/uL   nRBC 0.0 0.0 - 0.2 %   Neutrophils Relative % 58 %   Neutro Abs 2.1 1.7 - 7.7 K/uL   Lymphocytes Relative 22 %   Lymphs Abs 0.8 0.7 - 4.0 K/uL   Monocytes Relative 13 %   Monocytes Absolute 0.5 0.1 - 1.0 K/uL   Eosinophils Relative 5 %   Eosinophils Absolute 0.2 0.0 - 0.5 K/uL   Basophils Relative 1 %   Basophils Absolute 0.0 0.0 - 0.1 K/uL   Immature Granulocytes 1 %   Abs Immature Granulocytes 0.03 0.00 - 0.07 K/uL     RADIOGRAPHIC STUDIES:  No recent imaging results.   CODE STATUS:  Code Status History     Date Active Date Inactive Code Status Order ID Comments User Context   07/20/2014 0933 07/21/2014 1200 Full Code [id number]  Louis Shove, MD Inpatient      Advance Directive Documentation    Flowsheet Row Most Recent Value  Type of Advance Directive Healthcare Power of Attorney, Living will  Pre-existing out of facility DNR order (yellow form or pink MOST form) --  MOST Form in Place? --       Orders Placed This Encounter  Procedures   Comprehensive metabolic panel    Standing Status:   Standing    Number of Occurrences:   11    Expiration Date:   02/10/2024   CBC with Differential/Platelet    Standing Status:   Standing    Number of Occurrences:   11    Expiration Date:   02/10/2024   Magnesium    Standing  Status:   Standing    Number of Occurrences:   11    Expiration Date:   02/10/2024     Future Appointments  Date Time Provider Department Center  02/24/2023  1:00 PM [facility] PHLEBOTOMIST [facility] None  02/24/2023  1:30 PM Koehn Salehi, Chinita, MD [facility] None  02/24/2023  2:00 PM [facility] INFUSION [facility] None      This document was completed utilizing speech recognition software. Grammatical errors, random word insertions, pronoun errors, and incomplete sentences are an occasional consequence of this system due to software limitations, ambient noise, and hardware issues. Any formal questions or concerns about the content, text or information contained within the body of this dictation should be directly addressed to the provider for clarification.

## 2023-02-10 NOTE — Assessment & Plan Note (Signed)
-  This has resolved.  He can take Zofran as needed if recurrence of symptoms.

## 2023-02-10 NOTE — Assessment & Plan Note (Addendum)
 Elevated alkaline phosphatase likely due to bone disease. Discussed the use of Xgeva  to prevent fractures and strengthen bones. -Started Xgeva  injections from 01/27/23 and continue it monthly. --Increase calcium  supplement intake to 1200mg  daily. -Monitor for constipation, manage with increased fiber intake and MiraLAX as needed.

## 2023-02-13 ENCOUNTER — Other Ambulatory Visit: Payer: Self-pay

## 2023-02-24 ENCOUNTER — Ambulatory Visit: Payer: Medicare Other | Admitting: Oncology

## 2023-02-24 ENCOUNTER — Inpatient Hospital Stay: Payer: Medicare Other | Admitting: Oncology

## 2023-02-24 ENCOUNTER — Inpatient Hospital Stay: Payer: Medicare Other

## 2023-02-24 ENCOUNTER — Encounter: Payer: Self-pay | Admitting: Oncology

## 2023-02-24 VITALS — BP 143/85 | HR 79 | Temp 98.1°F | Resp 18 | Ht 73.0 in | Wt 195.7 lb

## 2023-02-24 DIAGNOSIS — C7951 Secondary malignant neoplasm of bone: Secondary | ICD-10-CM

## 2023-02-24 DIAGNOSIS — C3412 Malignant neoplasm of upper lobe, left bronchus or lung: Secondary | ICD-10-CM

## 2023-02-24 LAB — CBC WITH DIFFERENTIAL/PLATELET
Abs Immature Granulocytes: 0.02 10*3/uL (ref 0.00–0.07)
Basophils Absolute: 0 10*3/uL (ref 0.0–0.1)
Basophils Relative: 0 %
Eosinophils Absolute: 0.3 10*3/uL (ref 0.0–0.5)
Eosinophils Relative: 5 %
HCT: 37.4 % — ABNORMAL LOW (ref 39.0–52.0)
Hemoglobin: 12 g/dL — ABNORMAL LOW (ref 13.0–17.0)
Immature Granulocytes: 0 %
Lymphocytes Relative: 14 %
Lymphs Abs: 0.7 10*3/uL (ref 0.7–4.0)
MCH: 29.4 pg (ref 26.0–34.0)
MCHC: 32.1 g/dL (ref 30.0–36.0)
MCV: 91.7 fL (ref 80.0–100.0)
Monocytes Absolute: 0.7 10*3/uL (ref 0.1–1.0)
Monocytes Relative: 14 %
Neutro Abs: 3.2 10*3/uL (ref 1.7–7.7)
Neutrophils Relative %: 67 %
Platelets: 151 10*3/uL (ref 150–400)
RBC: 4.08 MIL/uL — ABNORMAL LOW (ref 4.22–5.81)
RDW: 22.3 % — ABNORMAL HIGH (ref 11.5–15.5)
WBC: 4.8 10*3/uL (ref 4.0–10.5)
nRBC: 0 % (ref 0.0–0.2)

## 2023-02-24 LAB — COMPREHENSIVE METABOLIC PANEL
ALT: 21 U/L (ref 0–44)
AST: 21 U/L (ref 15–41)
Albumin: 3.8 g/dL (ref 3.5–5.0)
Alkaline Phosphatase: 201 U/L — ABNORMAL HIGH (ref 38–126)
Anion gap: 6 (ref 5–15)
BUN: 21 mg/dL (ref 8–23)
CO2: 22 mmol/L (ref 22–32)
Calcium: 8.6 mg/dL — ABNORMAL LOW (ref 8.9–10.3)
Chloride: 113 mmol/L — ABNORMAL HIGH (ref 98–111)
Creatinine, Ser: 0.71 mg/dL (ref 0.61–1.24)
GFR, Estimated: >60 mL/min (ref >60)
Glucose, Bld: 97 mg/dL (ref 70–99)
Potassium: 4.6 mmol/L (ref 3.5–5.1)
Sodium: 141 mmol/L (ref 135–145)
Total Bilirubin: 0.5 mg/dL (ref 0.0–1.2)
Total Protein: 6.6 g/dL (ref 6.5–8.1)

## 2023-02-24 LAB — MAGNESIUM: Magnesium: 1.9 mg/dL (ref 1.7–2.4)

## 2023-02-24 MED ORDER — DENOSUMAB 120 MG/1.7ML ~~LOC~~ SOLN
120.0000 mg | Freq: Once | SUBCUTANEOUS | Status: AC
  Administered 2023-02-24: 120 mg via SUBCUTANEOUS
  Filled 2023-02-24: qty 1.7

## 2023-02-24 NOTE — Assessment & Plan Note (Signed)
Elevated alkaline phosphatase likely due to bone disease. Discussed the use of Xgeva to prevent fractures and strengthen bones. -Started Xgeva injections from 01/27/23 and continue it monthly. -Continue calcium supplements at 1200mg  daily. -Monitor for constipation, manage with increased fiber intake and MiraLAX as needed.

## 2023-02-24 NOTE — Assessment & Plan Note (Addendum)
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 No evidence of liver or other visceral involvement. Patient has a history of secondhand smoke exposure. Family history of various cancers.   -Previously I discussed staging, diagnosis, prognosis, plan of care, treatment options with the patient and his wife.  Reviewed NCCN guidelines.  Discussed that all treatment options are palliative in nature and not curative intent, given stage IV disease.  They verbalized understanding.  - Ordered Foundation Medicine testing on the biopsy specimen to identify potential targetable mutations. 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  No other major actionable mutations.  -Given ALK mutation positivity, he would be candidate for targeted therapy.  Plan made to proceed with Lorlatinib 100 mg daily dosing, starting from 01/14/2023.  Side effects include but not limited to: increased blood pressure, edema, changes in electrolytes, increase in cholesterol and triglycerides, changes in LFTs, peripheral neuropathy, CNS/mood changes, and rare but serious risk for AV block and ILD/pneumonitis.  EKG on 01/13/2023 showed normal sinus rhythm, normal QT interval of 372 ms.  This will be monitored periodically as needed.  -MRI of the brain performed on 01/01/2023 showed no definite evidence of intracranial metastatic disease noted.    -Patient has been compliant with Lorlatinib and tolerating it very well.  Labs today reveal no dose-limiting toxicities.  Alkaline phosphatase decreased from 316 to 201, indicating a positive response to treatment. Calcium levels stable(8.6).  - Discussed maintaining calcium intake and potential medication side effects.   - Continue current medication regimen  - Administer Xgeva injection today and continue this every 4 weeks  - Continue calcium  supplements 600 mg twice daily  - Consider travel plans based on future WBC and neutrophil levels  -Given bone metastatic disease, we started him on Xgeva from 01/27/23.  This will be continued every 4 weeks.  RTC in 4 weeks for labs, follow-up and Xgeva injection.  We will also check lipid panel on return visit.  Will plan to obtain restaging PET/CT scan in mid March 2025, to be ordered on future visit.

## 2023-02-24 NOTE — Patient Instructions (Signed)
Denosumab Injection (Oncology) What is this medication? DENOSUMAB (den oh SUE mab) prevents weakened bones caused by cancer. It may also be used to treat noncancerous bone tumors that cannot be removed by surgery. It can also be used to treat high calcium levels in the blood caused by cancer. It works by blocking a protein that causes bones to break down quickly. This slows down the release of calcium from bones, which lowers calcium levels in your blood. It also makes your bones stronger and less likely to break (fracture). This medicine may be used for other purposes; ask your health care provider or pharmacist if you have questions. COMMON BRAND NAME(S): XGEVA What should I tell my care team before I take this medication? They need to know if you have any of these conditions: Dental disease Having surgery or tooth extraction Infection Kidney disease Low levels of calcium or vitamin D in the blood Malnutrition On hemodialysis Skin conditions or sensitivity Thyroid or parathyroid disease An unusual reaction to denosumab, other medications, foods, dyes, or preservatives Pregnant or trying to get pregnant Breast-feeding How should I use this medication? This medication is for injection under the skin. It is given by your care team in a hospital or clinic setting. A special MedGuide will be given to you before each treatment. Be sure to read this information carefully each time. Talk to your care team about the use of this medication in children. While it may be prescribed for children as young as 13 years for selected conditions, precautions do apply. Overdosage: If you think you have taken too much of this medicine contact a poison control center or emergency room at once. NOTE: This medicine is only for you. Do not share this medicine with others. What if I miss a dose? Keep appointments for follow-up doses. It is important not to miss your dose. Call your care team if you are unable to  keep an appointment. What may interact with this medication? Do not take this medication with any of the following: Other medications containing denosumab This medication may also interact with the following: Medications that lower your chance of fighting infection Steroid medications, such as prednisone or cortisone This list may not describe all possible interactions. Give your health care provider a list of all the medicines, herbs, non-prescription drugs, or dietary supplements you use. Also tell them if you smoke, drink alcohol, or use illegal drugs. Some items may interact with your medicine. What should I watch for while using this medication? Your condition will be monitored carefully while you are receiving this medication. You may need blood work while taking this medication. This medication may increase your risk of getting an infection. Call your care team for advice if you get a fever, chills, sore throat, or other symptoms of a cold or flu. Do not treat yourself. Try to avoid being around people who are sick. You should make sure you get enough calcium and vitamin D while you are taking this medication, unless your care team tells you not to. Discuss the foods you eat and the vitamins you take with your care team. Some people who take this medication have severe bone, joint, or muscle pain. This medication may also increase your risk for jaw problems or a broken thigh bone. Tell your care team right away if you have severe pain in your jaw, bones, joints, or muscles. Tell your care team if you have any pain that does not go away or that gets worse. Talk  to your care team if you may be pregnant. Serious birth defects can occur if you take this medication during pregnancy and for 5 months after the last dose. You will need a negative pregnancy test before starting this medication. Contraception is recommended while taking this medication and for 5 months after the last dose. Your care team  can help you find the option that works for you. What side effects may I notice from receiving this medication? Side effects that you should report to your care team as soon as possible: Allergic reactions--skin rash, itching, hives, swelling of the face, lips, tongue, or throat Bone, joint, or muscle pain Low calcium level--muscle pain or cramps, confusion, tingling, or numbness in the hands or feet Osteonecrosis of the jaw--pain, swelling, or redness in the mouth, numbness of the jaw, poor healing after dental work, unusual discharge from the mouth, visible bones in the mouth Side effects that usually do not require medical attention (report to your care team if they continue or are bothersome): Cough Diarrhea Fatigue Headache Nausea This list may not describe all possible side effects. Call your doctor for medical advice about side effects. You may report side effects to FDA at 1-800-FDA-1088. Where should I keep my medication? This medication is given in a hospital or clinic. It will not be stored at home. NOTE: This sheet is a summary. It may not cover all possible information. If you have questions about this medicine, talk to your doctor, pharmacist, or health care provider.  2024 Elsevier/Gold Standard (2021-06-05 00:00:00)

## 2023-02-24 NOTE — Progress Notes (Signed)
[facility]  ONCOLOGY CLINIC PROGRESS NOTE   Patient Care Team: Sigmund Hazel, MD as PCP - General (Family Medicine) Revankar, Aundra Dubin, MD as PCP - Cardiology (Cardiology) Lanier Prude, MD as PCP - Electrophysiology (Cardiology) Raechel Chute, MD as Consulting Physician (Pulmonary Disease) Lytle Butte, RN as Oncology Nurse Navigator  PATIENT NAME: Gregg Guerrero   MR#: [id number] DOB: [birth year]  Date of visit: 02/24/2023  ASSESSMENT & PLAN:   Gregg Guerrero is a 73 y.o. 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 No evidence of liver or other visceral involvement. Patient has a history of secondhand smoke exposure. Family history of various cancers.   -Previously I discussed staging, diagnosis, prognosis, plan of care, treatment options with the patient and his wife.  Reviewed NCCN guidelines.  Discussed that all treatment options are palliative in nature and not curative intent, given stage IV disease.  They verbalized understanding.  - Ordered Foundation Medicine testing on the biopsy specimen to identify potential targetable mutations. 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  No other major actionable mutations.  -Given ALK mutation positivity, he would be candidate for targeted therapy.  Plan made to proceed with Lorlatinib 100 mg daily dosing, starting from 01/14/2023.  Side effects include but not limited to: increased blood pressure, edema, changes  in electrolytes, increase in cholesterol and triglycerides, changes in LFTs, peripheral neuropathy, CNS/mood changes, and rare but serious risk for AV block and ILD/pneumonitis.  EKG on 01/13/2023 showed normal sinus rhythm, normal QT interval of 372 ms.  This will be monitored periodically as needed.  -MRI of the brain performed on 01/01/2023 showed no definite evidence of intracranial metastatic disease noted.    -Patient has been compliant with Lorlatinib and tolerating it very well.  Labs today reveal no dose-limiting toxicities.  Alkaline phosphatase decreased from 316 to 201, indicating a positive response to treatment. Calcium levels stable(8.6).  - Discussed maintaining calcium intake and potential medication side effects.   - Continue current medication regimen  - Administer Xgeva injection today and continue this every 4 weeks  - Continue calcium supplements 600 mg twice daily  - Consider travel plans based on future WBC and neutrophil levels  -Given bone metastatic disease, we started him on Xgeva from 01/27/23.  This will be continued every 4 weeks.  RTC in 4 weeks for labs, follow-up and Xgeva injection.  We will also check lipid panel on return visit.  Will plan to obtain restaging PET/CT scan in mid March 2025, to be ordered on future visit.  Metastasis to bone (HCC) Elevated alkaline phosphatase likely due to bone disease. Discussed the use of Xgeva to prevent fractures and strengthen bones. -Started Xgeva injections from 01/27/23 and continue it monthly. -Continue calcium supplements at 1200mg  daily. -Monitor for constipation, manage with increased fiber intake and MiraLAX as needed.    I reviewed lab results and outside records for this visit and discussed relevant results with the patient. Diagnosis, plan of care and treatment options were also discussed  in detail with the patient. Opportunity provided to ask questions and answers provided to his apparent satisfaction.  Provided instructions to call our clinic with any problems, questions or concerns prior to return visit. I recommended to continue follow-up with PCP and sub-specialists. He verbalized understanding and agreed with the plan.   NCCN guidelines have been consulted in the planning of this patient's care.  I spent a total of 30 minutes during this encounter with the patient including review of chart and various tests results, discussions about plan of care and coordination of care plan.   Meryl Crutch, MD  02/24/2023 3:26 PM  [facility] - [facility] [address] [name] [address] Dept: [phone] Dept Fax: [phone]    CHIEF COMPLAINT/ REASON FOR VISIT:   Adenocarcinoma of left lung with metastasis to the bones.  Stage IV disease.  ALK mutation positive.  Current Treatment: Lorlatinib 100 mg p.o. daily starting from 01/14/2023.  INTERVAL HISTORY:    Discussed the use of AI scribe software for clinical note transcription with the patient, who gave verbal consent to proceed.   Gregg Guerrero is here today for repeat clinical assessment.  He started taking Lorlatinib from 01/14/2023. He reports improvement in symptoms since starting the prescribed medication. He notes a significant decrease in nausea and an increase in appetite. The patient also reports a weight gain, which is a positive sign considering his previous weight loss.   He reports feeling well with no major health issues since the last visit. He has been active, working outside, and eating well. He denies experiencing any nausea or vomiting. He has been receiving Xgeva injections, which he tolerates well, comparing the discomfort to that of a flu shot. He is currently taking calcium supplements, two pills a day.  I have reviewed the past medical history, past surgical history, social history and family history with the patient and  they are unchanged from previous note.  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 Additionally there were scattered sclerotic bone lesions diffusely along the skeleton, and numerous calcified lymph nodes throughout the mediastinum and left hilum.    12/04/2022 PET scan   PET scan was notable for a 3.9 cm irregular spiculated mass in the left upper lobe, suspicious for bronchogenic carcinoma. 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  Whole body bone scan confirmed widespread osseous metastases. He denies any associated pain.    12/23/2022 Initial Diagnosis   Malignant neoplasm of upper lobe of left lung [facility]):  He has been under the care of his PCP for what was initially thought to be a pneumonia on CXR. Patient developed shortness of breath in September 2024 and was seen by his primary care physician.  At that time a chest x-ray was  done which showed an infiltrate and question of atypical pneumonia.  The patient was treated with antibiotics however his symptoms did not improve.  A CT of the chest was ordered to further evaluate etiology for nonresolving dyspnea.   CT chest on 11/11/2022 which showed a spiculated nodule in the lingula with diffuse interstitial thickening in the left upper lobe that was  concerning for an aggressive process or malignancy.  This led to PET/CT, pulmonology referral and additional evaluation.  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 Per his wife the melanoma was low grade and did not require any further treatment after excision.    12/23/2022 Procedure   Patient had initial consultation in pulmonology clinic on 12/17/2022 and underwent navigational bronchoscopy, EBUS and biopsy of left upper lobe lung nodule on 12/23/2022, performed by Dr. Larinda Buttery.  On EBUS, hilar and mediastinal lymph node stations were examined but no lymph nodes were encountered.  This was suspected to be secondary to severe calcification noted on CT scan.   12/23/2022 Pathology Results   Pathology from left upper lobe lung nodule biopsy came back positive for adenocarcinoma.  Brushings from left upper lobe and lavage showed rare atypical cells.   12/30/2022 Cancer Staging   Staging form: Lung, AJCC 8th Edition - Clinical stage from 12/30/2022: Stage IVB (cT2a, cN0, cM1c) - Signed by Meryl Crutch, MD on 12/30/2022 Stage prefix: Initial diagnosis    Miscellaneous   Request submitted for Foundation Medicine testing on the specimen and Guardant 360 (liquid biopsy) for any actionable mutations and also for PD-L1 testing.  Treatment decisions to be made pending these results.   01/05/2023 Pathology Results   Foundation One CDx: ALK + EML4-ALK fusion (Variant 1)  PD-L1 TPS 5%  DNMT3A P904L mutation noted (unknown significance in his tumor type).    01/06/2023 -  Chemotherapy   Patient is on Treatment Plan : LUNG NSCLC Lorlatinib q28d         REVIEW OF SYSTEMS:   Review of Systems - Oncology  All other pertinent systems were reviewed with the patient and are negative.  ALLERGIES: He has no known allergies.  MEDICATIONS:  Current Outpatient Medications  Medication Sig Dispense Refill   atorvastatin  (LIPITOR) 10 MG tablet Take 10 mg by mouth at bedtime.     Calcium Carbonate-Vit D-Min (CALCIUM 1200 PO) Take 750 mg by mouth in the morning, at noon, and at bedtime. Patient is working towards increasing to 1200 mgs 5 times a day     Cholecalciferol (DIALYVITE VITAMIN D 5000) 125 MCG (5000 UT) capsule Take 1,500 Units by mouth every Saturday.     clonazePAM (KLONOPIN) 1 MG tablet Take 1 mg by mouth at bedtime.     clopidogrel (PLAVIX) 75 MG tablet Take 75 mg by mouth daily.     Ferrous Sulfate (IRON) 325 (65 FE) MG TABS Take 325 mg by mouth daily.     latanoprost (XALATAN) 0.005 % ophthalmic solution Place 1 drop into both eyes at bedtime.     loratadine (CLARITIN) 10 MG tablet Take 10 mg by mouth at bedtime.     lorlatinib (LORBRENA) 100 MG tablet Take 1 tablet (100 mg total) by mouth daily. Swallow tablets whole. Do not chew, crush or split tablets. 28 tablet 5   MAGNESIUM PO Take 1 tablet by mouth daily.     Multiple Vitamin (MULTIVITAMIN) tablet Take 1 tablet by mouth  daily.     traZODone (DESYREL) 50 MG tablet Take 25 mg by mouth at bedtime.     benzonatate (TESSALON) 200 MG capsule Take 200 mg by mouth 3 (three) times daily as needed for cough. (Patient not taking: Reported on 02/24/2023)     fluticasone (FLONASE) 50 MCG/ACT nasal spray Place 1 spray into both nostrils daily as needed for allergies. (Patient not taking: Reported on 02/24/2023)     omeprazole (PRILOSEC) 20 MG capsule Take 1 capsule (20 mg total) by mouth daily. (Patient not taking: Reported on 02/24/2023) 30 capsule 11   ondansetron (ZOFRAN-ODT) 8 MG disintegrating tablet Take 1 tablet (8 mg total) by mouth every 8 (eight) hours as needed for nausea or vomiting. (Patient not taking: Reported on 01/27/2023) 60 tablet 3   No current facility-administered medications for this visit.     VITALS:   Blood pressure (!) 143/85, pulse 79, temperature 98.1 F (36.7 C), temperature source Temporal, resp. rate 18, height 6\' 1"  (1.854  m), weight 195 lb 11.2 oz (88.8 kg), SpO2 98%.  Wt Readings from Last 3 Encounters:  02/24/23 195 lb 11.2 oz (88.8 kg)  02/10/23 191 lb 11.2 oz (87 kg)  01/27/23 185 lb 1.6 oz (84 kg)    Body mass index is 25.82 kg/m.  Performance status (ECOG): 1 - Symptomatic but completely ambulatory   Onc Performance Status - 02/24/23 1327       ECOG Perf Status   ECOG Perf Status Restricted in physically strenuous activity but ambulatory and able to carry out work of a light or sedentary nature, e.g., light house work, office work      KPS SCALE   KPS % SCORE Able to carry on normal activity, minor s/s of disease             PHYSICAL EXAM:   Physical Exam Constitutional:      General: He is not in acute distress.    Appearance: Normal appearance.  HENT:     Head: Normocephalic and atraumatic.  Eyes:     General: No scleral icterus.    Conjunctiva/sclera: Conjunctivae normal.  Cardiovascular:     Rate and Rhythm: Normal rate and regular rhythm.     Heart sounds: Normal heart sounds.  Pulmonary:     Effort: Pulmonary effort is normal.     Breath sounds: Normal breath sounds.  Abdominal:     General: There is no distension.  Musculoskeletal:     Right lower leg: No edema.     Left lower leg: No edema.  Neurological:     General: No focal deficit present.     Mental Status: He is alert and oriented to person, place, and time.  Psychiatric:        Mood and Affect: Mood normal.        Behavior: Behavior normal.        Thought Content: Thought content normal.       LABORATORY DATA:   I have reviewed the data as listed.  Results for orders placed or performed in visit on 02/24/23  Magnesium  Result Value Ref Range   Magnesium 1.9 1.7 - 2.4 mg/dL  CBC with Differential/Platelet  Result Value Ref Range   WBC 4.8 4.0 - 10.5 K/uL   RBC 4.08 (L) 4.22 - 5.81 MIL/uL   Hemoglobin 12.0 (L) 13.0 - 17.0 g/dL   HCT 40.9 (L) 81.1 - 91.4 %   MCV 91.7 80.0 - 100.0 fL   MCH 29.4  26.0 - 34.0 pg   MCHC 32.1 30.0 - 36.0 g/dL   RDW 32.4 (H) 40.1 - 02.7 %   Platelets 151 150 - 400 K/uL   nRBC 0.0 0.0 - 0.2 %   Neutrophils Relative % 67 %   Neutro Abs 3.2 1.7 - 7.7 K/uL   Lymphocytes Relative 14 %   Lymphs Abs 0.7 0.7 - 4.0 K/uL   Monocytes Relative 14 %   Monocytes Absolute 0.7 0.1 - 1.0 K/uL   Eosinophils Relative 5 %   Eosinophils Absolute 0.3 0.0 - 0.5 K/uL   Basophils Relative 0 %   Basophils Absolute 0.0 0.0 - 0.1 K/uL   Immature Granulocytes 0 %   Abs Immature Granulocytes 0.02 0.00 - 0.07 K/uL  Comprehensive metabolic panel  Result Value Ref Range   Sodium 141 135 - 145 mmol/L   Potassium 4.6 3.5 - 5.1 mmol/L   Chloride 113 (H) 98 - 111 mmol/L   CO2 22 22 - 32 mmol/L   Glucose, Bld 97 70 - 99 mg/dL   BUN 21 8 - 23 mg/dL   Creatinine, Ser 2.53 0.61 - 1.24 mg/dL   Calcium 8.6 (L) 8.9 - 10.3 mg/dL   Total Protein 6.6 6.5 - 8.1 g/dL   Albumin 3.8 3.5 - 5.0 g/dL   AST 21 15 - 41 U/L   ALT 21 0 - 44 U/L   Alkaline Phosphatase 201 (H) 38 - 126 U/L   Total Bilirubin 0.5 0.0 - 1.2 mg/dL   GFR, Estimated >66 >44 mL/min   Anion gap 6 5 - 15     RADIOGRAPHIC STUDIES:  No recent imaging results.   CODE STATUS:  Code Status History     Date Active Date Inactive Code Status Order ID Comments User Context   07/20/2014 0933 07/21/2014 1200 Full Code [id number]  Julio Sicks, MD Inpatient      Advance Directive Documentation    Flowsheet Row Most Recent Value  Type of Advance Directive Healthcare Power of Attorney, Living will  Pre-existing out of facility DNR order (yellow form or pink MOST form) --  "MOST" Form in Place? --       Orders Placed This Encounter  Procedures   CBC with Differential/Platelet    Standing Status:   Future    Expected Date:   03/24/2023    Expiration Date:   02/24/2024   Comprehensive metabolic panel    Standing Status:   Future    Expected Date:   03/24/2023    Expiration Date:   02/24/2024   Magnesium    Standing  Status:   Future    Expected Date:   03/24/2023    Expiration Date:   02/24/2024   Lipid panel    Standing Status:   Future    Expected Date:   03/24/2023    Expiration Date:   02/24/2024     Future Appointments  Date Time Provider Department Center  03/24/2023  8:15 AM [facility] PHLEBOTOMIST [facility] None  03/24/2023  8:30 AM Desmund Elman, Archie Patten, MD [facility] None  03/24/2023  9:30 AM [facility] INFUSION [facility] None      This document was completed utilizing speech recognition software. Grammatical errors, random word insertions, pronoun errors, and incomplete sentences are an occasional consequence of this system due to software limitations, ambient noise, and hardware issues. Any formal questions or concerns about the content, text or information contained within the body of this dictation should be directly addressed to the provider for clarification.

## 2023-02-25 ENCOUNTER — Other Ambulatory Visit: Payer: Self-pay

## 2023-03-03 ENCOUNTER — Other Ambulatory Visit: Payer: Self-pay

## 2023-03-03 NOTE — Progress Notes (Signed)
 Specialty Pharmacy Refill Coordination Note  Gregg Guerrero is a 73 y.o. male contacted today regarding refills of specialty medication(s) Lorlatinib  (LORBRENA )   Patient requested Delivery   Delivery date: 03/11/23   Verified address: [address]   [name] [id number]   Medication will be filled on 03/10/23.

## 2023-03-10 ENCOUNTER — Other Ambulatory Visit (HOSPITAL_COMMUNITY): Payer: Self-pay

## 2023-03-11 ENCOUNTER — Other Ambulatory Visit: Payer: Self-pay

## 2023-03-24 ENCOUNTER — Encounter: Payer: Self-pay | Admitting: Oncology

## 2023-03-24 ENCOUNTER — Inpatient Hospital Stay: Payer: Medicare Other | Admitting: Oncology

## 2023-03-24 ENCOUNTER — Inpatient Hospital Stay: Payer: Medicare Other | Attending: Oncology

## 2023-03-24 ENCOUNTER — Inpatient Hospital Stay: Payer: Medicare Other

## 2023-03-24 ENCOUNTER — Other Ambulatory Visit: Payer: Self-pay | Admitting: Oncology

## 2023-03-24 VITALS — BP 137/88 | HR 78 | Temp 98.0°F | Resp 18 | Ht 73.0 in | Wt 198.6 lb

## 2023-03-24 DIAGNOSIS — C3412 Malignant neoplasm of upper lobe, left bronchus or lung: Secondary | ICD-10-CM

## 2023-03-24 DIAGNOSIS — Z79899 Other long term (current) drug therapy: Secondary | ICD-10-CM | POA: Insufficient documentation

## 2023-03-24 DIAGNOSIS — R197 Diarrhea, unspecified: Secondary | ICD-10-CM | POA: Diagnosis not present

## 2023-03-24 DIAGNOSIS — Z7722 Contact with and (suspected) exposure to environmental tobacco smoke (acute) (chronic): Secondary | ICD-10-CM | POA: Diagnosis not present

## 2023-03-24 DIAGNOSIS — C7951 Secondary malignant neoplasm of bone: Secondary | ICD-10-CM | POA: Diagnosis present

## 2023-03-24 LAB — CBC WITH DIFFERENTIAL/PLATELET
Abs Immature Granulocytes: 0.02 10*3/uL (ref 0.00–0.07)
Basophils Absolute: 0 10*3/uL (ref 0.0–0.1)
Basophils Relative: 1 %
Eosinophils Absolute: 0.3 10*3/uL (ref 0.0–0.5)
Eosinophils Relative: 7 %
HCT: 41.8 % (ref 39.0–52.0)
Hemoglobin: 13.1 g/dL (ref 13.0–17.0)
Immature Granulocytes: 1 %
Lymphocytes Relative: 18 %
Lymphs Abs: 0.7 10*3/uL (ref 0.7–4.0)
MCH: 29.3 pg (ref 26.0–34.0)
MCHC: 31.3 g/dL (ref 30.0–36.0)
MCV: 93.5 fL (ref 80.0–100.0)
Monocytes Absolute: 0.6 10*3/uL (ref 0.1–1.0)
Monocytes Relative: 15 %
Neutro Abs: 2.4 10*3/uL (ref 1.7–7.7)
Neutrophils Relative %: 58 %
Platelets: 152 10*3/uL (ref 150–400)
RBC: 4.47 MIL/uL (ref 4.22–5.81)
RDW: 18.3 % — ABNORMAL HIGH (ref 11.5–15.5)
WBC: 4 10*3/uL (ref 4.0–10.5)
nRBC: 0 % (ref 0.0–0.2)

## 2023-03-24 LAB — COMPREHENSIVE METABOLIC PANEL
ALT: 26 U/L (ref 0–44)
AST: 24 U/L (ref 15–41)
Albumin: 4.3 g/dL (ref 3.5–5.0)
Alkaline Phosphatase: 110 U/L (ref 38–126)
Anion gap: 6 (ref 5–15)
BUN: 15 mg/dL (ref 8–23)
CO2: 26 mmol/L (ref 22–32)
Calcium: 8.3 mg/dL — ABNORMAL LOW (ref 8.9–10.3)
Chloride: 111 mmol/L (ref 98–111)
Creatinine, Ser: 0.92 mg/dL (ref 0.61–1.24)
GFR, Estimated: >60 mL/min (ref >60)
Glucose, Bld: 101 mg/dL — ABNORMAL HIGH (ref 70–99)
Potassium: 4.4 mmol/L (ref 3.5–5.1)
Sodium: 143 mmol/L (ref 135–145)
Total Bilirubin: 0.5 mg/dL (ref 0.0–1.2)
Total Protein: 6.6 g/dL (ref 6.5–8.1)

## 2023-03-24 LAB — LIPID PANEL
Cholesterol: 188 mg/dL (ref 0–200)
HDL: 33 mg/dL — ABNORMAL LOW (ref >40)
LDL Cholesterol: 122 mg/dL — ABNORMAL HIGH (ref 0–99)
Total CHOL/HDL Ratio: 5.7 ratio
Triglycerides: 165 mg/dL — ABNORMAL HIGH (ref <150)
VLDL: 33 mg/dL (ref 0–40)

## 2023-03-24 LAB — MAGNESIUM: Magnesium: 2 mg/dL (ref 1.7–2.4)

## 2023-03-24 MED ORDER — DENOSUMAB 120 MG/1.7ML ~~LOC~~ SOLN
120.0000 mg | Freq: Once | SUBCUTANEOUS | Status: AC
  Administered 2023-03-24: 120 mg via SUBCUTANEOUS
  Filled 2023-03-24: qty 1.7

## 2023-03-24 MED ORDER — DIPHENOXYLATE-ATROPINE 2.5-0.025 MG PO TABS: 1.0000 | ORAL_TABLET | Freq: Four times a day (QID) | ORAL | 0 refills | Status: DC | PRN

## 2023-03-24 NOTE — Patient Instructions (Signed)
 Denosumab Injection (Oncology) What is this medication? DENOSUMAB (den oh SUE mab) prevents weakened bones caused by cancer. It may also be used to treat noncancerous bone tumors that cannot be removed by surgery. It can also be used to treat high calcium levels in the blood caused by cancer. It works by blocking a protein that causes bones to break down quickly. This slows down the release of calcium from bones, which lowers calcium levels in your blood. It also makes your bones stronger and less likely to break (fracture). This medicine may be used for other purposes; ask your health care provider or pharmacist if you have questions. COMMON BRAND NAME(S): XGEVA What should I tell my care team before I take this medication? They need to know if you have any of these conditions: Dental disease Having surgery or tooth extraction Infection Kidney disease Low levels of calcium or vitamin D in the blood Malnutrition On hemodialysis Skin conditions or sensitivity Thyroid or parathyroid disease An unusual reaction to denosumab, other medications, foods, dyes, or preservatives Pregnant or trying to get pregnant Breast-feeding How should I use this medication? This medication is for injection under the skin. It is given by your care team in a hospital or clinic setting. A special MedGuide will be given to you before each treatment. Be sure to read this information carefully each time. Talk to your care team about the use of this medication in children. While it may be prescribed for children as young as 13 years for selected conditions, precautions do apply. Overdosage: If you think you have taken too much of this medicine contact a poison control center or emergency room at once. NOTE: This medicine is only for you. Do not share this medicine with others. What if I miss a dose? Keep appointments for follow-up doses. It is important not to miss your dose. Call your care team if you are unable to  keep an appointment. What may interact with this medication? Do not take this medication with any of the following: Other medications containing denosumab This medication may also interact with the following: Medications that lower your chance of fighting infection Steroid medications, such as prednisone or cortisone This list may not describe all possible interactions. Give your health care provider a list of all the medicines, herbs, non-prescription drugs, or dietary supplements you use. Also tell them if you smoke, drink alcohol, or use illegal drugs. Some items may interact with your medicine. What should I watch for while using this medication? Your condition will be monitored carefully while you are receiving this medication. You may need blood work while taking this medication. This medication may increase your risk of getting an infection. Call your care team for advice if you get a fever, chills, sore throat, or other symptoms of a cold or flu. Do not treat yourself. Try to avoid being around people who are sick. You should make sure you get enough calcium and vitamin D while you are taking this medication, unless your care team tells you not to. Discuss the foods you eat and the vitamins you take with your care team. Some people who take this medication have severe bone, joint, or muscle pain. This medication may also increase your risk for jaw problems or a broken thigh bone. Tell your care team right away if you have severe pain in your jaw, bones, joints, or muscles. Tell your care team if you have any pain that does not go away or that gets worse. Talk  to your care team if you may be pregnant. Serious birth defects can occur if you take this medication during pregnancy and for 5 months after the last dose. You will need a negative pregnancy test before starting this medication. Contraception is recommended while taking this medication and for 5 months after the last dose. Your care team  can help you find the option that works for you. What side effects may I notice from receiving this medication? Side effects that you should report to your care team as soon as possible: Allergic reactions--skin rash, itching, hives, swelling of the face, lips, tongue, or throat Bone, joint, or muscle pain Low calcium level--muscle pain or cramps, confusion, tingling, or numbness in the hands or feet Osteonecrosis of the jaw--pain, swelling, or redness in the mouth, numbness of the jaw, poor healing after dental work, unusual discharge from the mouth, visible bones in the mouth Side effects that usually do not require medical attention (report to your care team if they continue or are bothersome): Cough Diarrhea Fatigue Headache Nausea This list may not describe all possible side effects. Call your doctor for medical advice about side effects. You may report side effects to FDA at 1-800-FDA-1088. Where should I keep my medication? This medication is given in a hospital or clinic. It will not be stored at home. NOTE: This sheet is a summary. It may not cover all possible information. If you have questions about this medicine, talk to your doctor, pharmacist, or health care provider.  2024 Elsevier/Gold Standard (2021-06-05 00:00:00)

## 2023-03-24 NOTE — Assessment & Plan Note (Addendum)
 Previously elevated alkaline phosphatase likely due to bone disease. Discussed the use of Xgeva to prevent fractures and strengthen bones. -Started Xgeva injections from 01/27/23 and continue it monthly.  Dose given today. -Given persistent hypercalcemia, advised him to increase calcium supplements to 1800 mg daily dose.

## 2023-03-24 NOTE — Assessment & Plan Note (Addendum)
 Chronic diarrhea with loose, watery stools multiple times daily, likely medication-related. No associated nausea or vomiting. Weight is stable. Discussed Imodium use and potential side effects like constipation.  - Start Imodium: two tablets after the first loose bowel movement, then one tablet after each subsequent loose bowel movement, up to eight tablets per day. - Prescribed Lomotil and advised him to start taking it if diarrhea persists after one week of taking Imodium.

## 2023-03-24 NOTE — Progress Notes (Signed)
 Patient seen by Dr. Archie Patten Pasam today  Vitals are within treatment parameters:Yes   Labs are within treatment parameters: Yes   Treatment plan has been signed: Yes   Per physician team, Patient is ready for treatment and there are NO modifications to the treatment plan. Ready for injection.

## 2023-03-24 NOTE — Progress Notes (Signed)
 [facility]  ONCOLOGY CLINIC PROGRESS NOTE   Patient Care Team: Sigmund Hazel, MD as PCP - General (Family Medicine) Revankar, Aundra Dubin, MD as PCP - Cardiology (Cardiology) Lanier Prude, MD as PCP - Electrophysiology (Cardiology) Raechel Chute, MD as Consulting Physician (Pulmonary Disease) Lytle Butte, RN as Oncology Nurse Navigator  PATIENT NAME: Gregg Guerrero   MR#: [id number] DOB: Jun 03, 1950  Date of visit: 03/24/2023  ASSESSMENT & PLAN:   Gregg Guerrero is a 73 y.o. 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 No evidence of liver or other visceral involvement. Patient has a history of secondhand smoke exposure. Family history of various cancers.   -Previously I discussed staging, diagnosis, prognosis, plan of care, treatment options with the patient and his wife.  Reviewed NCCN guidelines.  Discussed that all treatment options are palliative in nature and not curative intent, given stage IV disease.  They verbalized understanding.  - Ordered Foundation Medicine testing on the biopsy specimen to identify potential targetable mutations. 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  No other major actionable mutations.  -Given ALK mutation positivity, he would be candidate for targeted therapy.  Plan made to proceed with Lorlatinib 100 mg daily dosing, starting from 01/14/2023.  Side effects include but not limited to: increased blood pressure, edema, changes  in electrolytes, increase in cholesterol and triglycerides, changes in LFTs, peripheral neuropathy, CNS/mood changes, and rare but serious risk for AV block and ILD/pneumonitis.  EKG on 01/13/2023 showed normal sinus rhythm, normal QT interval of 372 ms.  This will be monitored periodically as needed.  -MRI of the brain performed on 01/01/2023 showed no definite evidence of intracranial metastatic disease noted.    -Patient has been compliant with Lorlatinib and tolerating it very well.  Labs today reveal no dose-limiting toxicities.  Alkaline phosphatase decreased from 316 to 110, indicating a positive response to treatment. Calcium levels slightly down at 8.3. Continue current medication regimen  -Given persistent hypocalcemia, advised him to increase calcium supplements to total of 1800 mg daily.  -Consider travel plans based on future WBC and neutrophil levels  -Given bone metastatic disease, we started him on Xgeva from 01/27/23.  This will be continued every 4 weeks.  Dose given today.  Will follow-up on pending lipid panel from this morning.  RTC in 4 weeks for labs, follow-up and Xgeva injection.  We will obtain restaging PET scan prior to that visit and discuss results on return visit.  Metastasis to bone [facility]) Previously elevated alkaline phosphatase likely due to bone disease. Discussed the use of Xgeva to prevent fractures and strengthen bones. -Started Xgeva injections from 01/27/23 and continue it monthly.  Dose given today. -Given persistent hypercalcemia, advised him to increase calcium supplements to 1800 mg daily dose.  Diarrhea Chronic diarrhea with loose, watery stools multiple times daily, likely medication-related. No associated nausea or vomiting. Weight is stable. Discussed Imodium use and potential side effects like constipation.  - Start Imodium: two tablets after the first loose bowel movement, then  one tablet after each subsequent loose bowel movement, up to eight  tablets per day. - Prescribed Lomotil and advised him to start taking it if diarrhea persists after one week of taking Imodium.   I reviewed lab results and outside records for this visit and discussed relevant results with the patient. Diagnosis, plan of care and treatment options were also discussed in detail with the patient. Opportunity provided to ask questions and answers provided to his apparent satisfaction. Provided instructions to call our clinic with any problems, questions or concerns prior to return visit. I recommended to continue follow-up with PCP and sub-specialists. He verbalized understanding and agreed with the plan.   NCCN guidelines have been consulted in the planning of this patient's care.  I spent a total of 30 minutes during this encounter with the patient including review of chart and various tests results, discussions about plan of care and coordination of care plan.   Meryl Crutch, MD  03/24/2023 11:08 AM  [facility] - [facility] [address] [name] [address] Dept: [phone] Dept Fax: [phone]    CHIEF COMPLAINT/ REASON FOR VISIT:   Adenocarcinoma of left lung with metastasis to the bones.  Stage IV disease.  ALK mutation positive.  Current Treatment: Lorlatinib 100 mg p.o. daily starting from 01/14/2023.  INTERVAL HISTORY:    Discussed the use of AI scribe software for clinical note transcription with the patient, who gave verbal consent to proceed.   Gregg Guerrero is here today for repeat clinical assessment.  He started taking Lorlatinib from 01/14/2023. He reports improvement in symptoms since starting the prescribed medication. He notes a significant decrease in nausea and an increase in appetite. The patient also reports a weight gain, which is a positive sign considering his previous weight loss.   Over the last 2 weeks he has experienced  gastrointestinal issues, primarily diarrhea. The patient reports that the diarrhea is almost watery and occurs multiple times a day, with the urge persisting until bedtime. However, the patient does not experience any nocturnal symptoms. The patient denies any associated nausea or vomiting. He has been managing the diarrhea with Pepto Bismol, but stopped given lack of response. The patient's appetite remains good, and he has been able to maintain his weight, even gaining back to his normal weight.  In addition to the gastrointestinal issues, the patient mentions a slight swelling in his legs, leaving marks from his socks, particularly noticeable at night. The patient also discusses an upcoming trip and his concerns about managing his symptoms while traveling.  I have reviewed the past medical history, past surgical history, social history and family history with the patient and they are unchanged from previous note.  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 Additionally there were scattered sclerotic bone lesions diffusely along the skeleton, and numerous calcified lymph nodes throughout the  mediastinum and left hilum.    12/04/2022 PET scan   PET scan was notable for a 3.9 cm irregular spiculated mass in the left upper lobe, suspicious for bronchogenic carcinoma. 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  Whole body bone scan confirmed widespread osseous metastases. He denies any associated pain.    12/23/2022 Initial Diagnosis   Malignant neoplasm of upper lobe of left lung [facility]):  He has been under the care of his PCP for what was initially thought to be a pneumonia on CXR. Patient developed shortness of breath in September 2024 and was seen by his primary care physician.  At that time a chest x-ray was done which showed an infiltrate and question of atypical pneumonia.  The patient was treated with antibiotics however his symptoms did not improve.  A CT of the chest was ordered to further evaluate etiology for nonresolving dyspnea.   CT chest on 11/11/2022 which showed a spiculated nodule in the lingula with diffuse interstitial thickening in the left upper lobe that was concerning for an aggressive process or malignancy.  This led to PET/CT, pulmonology referral and additional evaluation.  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 Per his wife the melanoma was low grade and did not require any further treatment after excision.    12/23/2022 Procedure   Patient had initial consultation in pulmonology clinic on 12/17/2022 and underwent navigational bronchoscopy, EBUS and biopsy of left upper lobe lung nodule on 12/23/2022, performed by Dr. Larinda Buttery.  On EBUS, hilar and mediastinal lymph node stations were examined but no lymph nodes were encountered.  This was suspected to be secondary to severe calcification noted on CT scan.   12/23/2022 Pathology Results   Pathology from left upper lobe lung nodule biopsy came back positive for adenocarcinoma.  Brushings from left upper lobe and lavage showed rare atypical cells.   12/30/2022 Cancer Staging   Staging form: Lung, AJCC 8th Edition - Clinical stage from  12/30/2022: Stage IVB (cT2a, cN0, cM1c) - Signed by Meryl Crutch, MD on 12/30/2022 Stage prefix: Initial diagnosis    Miscellaneous   Request submitted for Foundation Medicine testing on the specimen and Guardant 360 (liquid biopsy) for any actionable mutations and also for PD-L1 testing.  Treatment decisions to be made pending these results.   01/05/2023 Pathology Results   Foundation One CDx: ALK + EML4-ALK fusion (Variant 1)  PD-L1 TPS 5%  DNMT3A P904L mutation noted (unknown significance in his tumor type).    01/06/2023 -  Chemotherapy   Patient is on Treatment Plan : LUNG NSCLC Lorlatinib q28d         REVIEW OF SYSTEMS:   Review of Systems - Oncology  All other pertinent systems were reviewed with the patient and are negative.  ALLERGIES: He has no known allergies.  MEDICATIONS:  Current Outpatient Medications  Medication Sig Dispense Refill   atorvastatin (LIPITOR) 10 MG tablet Take 10 mg by mouth at bedtime.     Calcium Carbonate-Vit D-Min (CALCIUM 1200 PO) Take 750 mg by mouth in the morning, at noon, and at bedtime. Patient is working towards increasing to 1200 mgs 5 times a day     Cholecalciferol (DIALYVITE VITAMIN D 5000) 125 MCG (5000 UT) capsule Take 1,500 Units by mouth every Saturday.     clonazePAM (KLONOPIN)  1 MG tablet Take 1 mg by mouth at bedtime.     clopidogrel (PLAVIX) 75 MG tablet Take 75 mg by mouth daily.     diphenoxylate-atropine (LOMOTIL) 2.5-0.025 MG tablet Take 1 tablet by mouth 4 (four) times daily as needed for diarrhea or loose stools. 30 tablet 0   Ferrous Sulfate (IRON) 325 (65 FE) MG TABS Take 325 mg by mouth daily.     fluticasone (FLONASE) 50 MCG/ACT nasal spray Place 1 spray into both nostrils daily as needed for allergies.     latanoprost (XALATAN) 0.005 % ophthalmic solution Place 1 drop into both eyes at bedtime.     loratadine (CLARITIN) 10 MG tablet Take 10 mg by mouth at bedtime.     lorlatinib (LORBRENA) 100 MG tablet Take 1  tablet (100 mg total) by mouth daily. Swallow tablets whole. Do not chew, crush or split tablets. 28 tablet 5   MAGNESIUM PO Take 1 tablet by mouth daily.     Multiple Vitamin (MULTIVITAMIN) tablet Take 1 tablet by mouth daily.     PREVIDENT 5000 BOOSTER PLUS 1.1 % PSTE Take 1 Application by mouth once as needed (Dry mouth).     traZODone (DESYREL) 50 MG tablet Take 25 mg by mouth at bedtime.     benzonatate (TESSALON) 200 MG capsule Take 200 mg by mouth 3 (three) times daily as needed for cough. (Patient not taking: Reported on 02/10/2023)     ondansetron (ZOFRAN-ODT) 8 MG disintegrating tablet Take 1 tablet (8 mg total) by mouth every 8 (eight) hours as needed for nausea or vomiting. (Patient not taking: Reported on 03/24/2023) 60 tablet 3   No current facility-administered medications for this visit.     VITALS:   Blood pressure 137/88, pulse 78, temperature 98 F (36.7 C), resp. rate 18, height 6\' 1"  (1.854 m), weight 198 lb 9.6 oz (90.1 kg), SpO2 100%.  Wt Readings from Last 3 Encounters:  03/24/23 198 lb 9.6 oz (90.1 kg)  02/24/23 195 lb 11.2 oz (88.8 kg)  02/10/23 191 lb 11.2 oz (87 kg)    Body mass index is 26.2 kg/m.   Onc Performance Status - 03/24/23 0835       ECOG Perf Status   ECOG Perf Status Restricted in physically strenuous activity but ambulatory and able to carry out work of a light or sedentary nature, e.g., light house work, office work              PHYSICAL EXAM:   Physical Exam Constitutional:      General: He is not in acute distress.    Appearance: Normal appearance.  HENT:     Head: Normocephalic and atraumatic.  Eyes:     General: No scleral icterus.    Conjunctiva/sclera: Conjunctivae normal.  Cardiovascular:     Rate and Rhythm: Normal rate and regular rhythm.     Heart sounds: Normal heart sounds.  Pulmonary:     Effort: Pulmonary effort is normal.     Breath sounds: Normal breath sounds.  Abdominal:     General: There is no  distension.  Musculoskeletal:     Right lower leg: No edema.     Left lower leg: No edema.  Neurological:     General: No focal deficit present.     Mental Status: He is alert and oriented to person, place, and time.  Psychiatric:        Mood and Affect: Mood normal.        Behavior:  Behavior normal.        Thought Content: Thought content normal.       LABORATORY DATA:   I have reviewed the data as listed.  Results for orders placed or performed in visit on 03/24/23  Magnesium  Result Value Ref Range   Magnesium 2.0 1.7 - 2.4 mg/dL  Comprehensive metabolic panel  Result Value Ref Range   Sodium 143 135 - 145 mmol/L   Potassium 4.4 3.5 - 5.1 mmol/L   Chloride 111 98 - 111 mmol/L   CO2 26 22 - 32 mmol/L   Glucose, Bld 101 (H) 70 - 99 mg/dL   BUN 15 8 - 23 mg/dL   Creatinine, Ser 9.81 0.61 - 1.24 mg/dL   Calcium 8.3 (L) 8.9 - 10.3 mg/dL   Total Protein 6.6 6.5 - 8.1 g/dL   Albumin 4.3 3.5 - 5.0 g/dL   AST 24 15 - 41 U/L   ALT 26 0 - 44 U/L   Alkaline Phosphatase 110 38 - 126 U/L   Total Bilirubin 0.5 0.0 - 1.2 mg/dL   GFR, Estimated >19 >14 mL/min   Anion gap 6 5 - 15  CBC with Differential/Platelet  Result Value Ref Range   WBC 4.0 4.0 - 10.5 K/uL   RBC 4.47 4.22 - 5.81 MIL/uL   Hemoglobin 13.1 13.0 - 17.0 g/dL   HCT 78.2 95.6 - 21.3 %   MCV 93.5 80.0 - 100.0 fL   MCH 29.3 26.0 - 34.0 pg   MCHC 31.3 30.0 - 36.0 g/dL   RDW 08.6 (H) 57.8 - 46.9 %   Platelets 152 150 - 400 K/uL   nRBC 0.0 0.0 - 0.2 %   Neutrophils Relative % 58 %   Neutro Abs 2.4 1.7 - 7.7 K/uL   Lymphocytes Relative 18 %   Lymphs Abs 0.7 0.7 - 4.0 K/uL   Monocytes Relative 15 %   Monocytes Absolute 0.6 0.1 - 1.0 K/uL   Eosinophils Relative 7 %   Eosinophils Absolute 0.3 0.0 - 0.5 K/uL   Basophils Relative 1 %   Basophils Absolute 0.0 0.0 - 0.1 K/uL   Immature Granulocytes 1 %   Abs Immature Granulocytes 0.02 0.00 - 0.07 K/uL     RADIOGRAPHIC STUDIES:  No recent imaging  results.   CODE STATUS:  Code Status History     Date Active Date Inactive Code Status Order ID Comments User Context   07/20/2014 0933 07/21/2014 1200 Full Code [id number]  Julio Sicks, MD Inpatient      Advance Directive Documentation    Flowsheet Row Most Recent Value  Type of Advance Directive Healthcare Power of Attorney, Living will  Pre-existing out of facility DNR order (yellow form or pink MOST form) --  "MOST" Form in Place? --       Orders Placed This Encounter  Procedures   NM PET Image Restag (PS) Skull Base To Thigh    Standing Status:   Future    Expected Date:   03/31/2023    Expiration Date:   03/23/2024    If indicated for the ordered procedure, I authorize the administration of a radiopharmaceutical per Radiology protocol:   Yes    Preferred imaging location?:   [facility]     Future Appointments  Date Time Provider Department Center  03/31/2023  3:00 PM [facility] PET CT 1 [facility] [place]  04/21/2023 10:45 AM [facility] PHLEBOTOMIST [facility] None  04/21/2023 11:00 AM Korinna Tat, MD [facility] None  04/21/2023  12:00 PM [facility] INFUSION [facility] None      This document was completed utilizing speech recognition software. Grammatical errors, random word insertions, pronoun errors, and incomplete sentences are an occasional consequence of this system due to software limitations, ambient noise, and hardware issues. Any formal questions or concerns about the content, text or information contained within the body of this dictation should be directly addressed to the provider for clarification.

## 2023-03-24 NOTE — Assessment & Plan Note (Addendum)
 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 No evidence of liver or other visceral involvement. Patient has a history of secondhand smoke exposure. Family history of various cancers.   -Previously I discussed staging, diagnosis, prognosis, plan of care, treatment options with the patient and his wife.  Reviewed NCCN guidelines.  Discussed that all treatment options are palliative in nature and not curative intent, given stage IV disease.  They verbalized understanding.  - Ordered Foundation Medicine testing on the biopsy specimen to identify potential targetable mutations. 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  No other major actionable mutations.  -Given ALK mutation positivity, he would be candidate for targeted therapy.  Plan made to proceed with Lorlatinib 100 mg daily dosing, starting from 01/14/2023.  Side effects include but not limited to: increased blood pressure, edema, changes in electrolytes, increase in cholesterol and triglycerides, changes in LFTs, peripheral neuropathy, CNS/mood changes, and rare but serious risk for AV block and ILD/pneumonitis.  EKG on 01/13/2023 showed normal sinus rhythm, normal QT interval of 372 ms.  This will be monitored periodically as needed.  -MRI of the brain performed on 01/01/2023 showed no definite evidence of intracranial metastatic disease noted.    -Patient has been compliant with Lorlatinib and tolerating it very well.  Labs today reveal no dose-limiting toxicities.  Alkaline phosphatase decreased from 316 to 110, indicating a positive response to treatment. Calcium levels slightly down at 8.3. Continue current medication regimen  -Given persistent hypocalcemia, advised him to increase calcium supplements to total of 1800 mg daily.  -Consider travel plans based on future WBC and neutrophil  levels  -Given bone metastatic disease, we started him on Xgeva from 01/27/23.  This will be continued every 4 weeks.  Dose given today.  Will follow-up on pending lipid panel from this morning.  RTC in 4 weeks for labs, follow-up and Xgeva injection.  We will obtain restaging PET scan prior to that visit and discuss results on return visit.

## 2023-03-25 ENCOUNTER — Other Ambulatory Visit: Payer: Self-pay

## 2023-03-25 ENCOUNTER — Other Ambulatory Visit (HOSPITAL_BASED_OUTPATIENT_CLINIC_OR_DEPARTMENT_OTHER): Payer: Self-pay

## 2023-03-25 MED ORDER — COMIRNATY 30 MCG/0.3ML IM SUSY
0.3000 mL | PREFILLED_SYRINGE | Freq: Once | INTRAMUSCULAR | 0 refills | Status: AC
  Filled 2023-03-25: qty 0.3, 1d supply, fill #0

## 2023-03-26 ENCOUNTER — Other Ambulatory Visit: Payer: Self-pay

## 2023-03-26 ENCOUNTER — Other Ambulatory Visit (HOSPITAL_COMMUNITY): Payer: Self-pay

## 2023-03-30 ENCOUNTER — Other Ambulatory Visit (HOSPITAL_COMMUNITY): Payer: Self-pay

## 2023-03-30 NOTE — Progress Notes (Signed)
 Specialty Pharmacy Ongoing Clinical Assessment Note  MERIT MAYBEE is a 73 y.o. male who is being followed by the specialty pharmacy service for Rx[facility] Oncology   Patient's specialty medication(s) reviewed today: Lorlatinib (LORBRENA)   Missed doses in the last 4 weeks: 0   Patient/Caregiver did not have any additional questions or concerns.   Therapeutic benefit summary: Patient is achieving benefit   Adverse events/side effects summary: Experienced adverse events/side effects (pt had some diarrhea, has currently resolved, has Imodium on hand if needed again in the future)   Patient's therapy is appropriate to: Continue    Goals Addressed             This Visit's Progress    Maintain optimal adherence to therapy   No change    Patient is initiating therapy. Patient will maintain adherence         Follow up:  3 months  Servando Snare Specialty Pharmacist

## 2023-03-30 NOTE — Progress Notes (Signed)
 Specialty Pharmacy Refill Coordination Note  Gregg Guerrero is a 73 y.o. male contacted today regarding refills of specialty medication(s) Lorlatinib [name])   Patient requested Delivery   Delivery date: 04/09/23   Verified address: [address] [name] [address]   Medication will be filled on 04/08/23.

## 2023-03-31 ENCOUNTER — Encounter (HOSPITAL_COMMUNITY)
Admission: RE | Admit: 2023-03-31 | Discharge: 2023-03-31 | Disposition: A | Discharge status: Home / Self Care | Payer: Medicare Other | Source: Ambulatory Visit | Attending: Oncology | Admitting: Oncology

## 2023-03-31 DIAGNOSIS — C3412 Malignant neoplasm of upper lobe, left bronchus or lung: Secondary | ICD-10-CM | POA: Insufficient documentation

## 2023-03-31 LAB — GLUCOSE, CAPILLARY: Glucose-Capillary: 101 mg/dL — ABNORMAL HIGH (ref 70–99)

## 2023-03-31 MED ORDER — FLUDEOXYGLUCOSE F - 18 (FDG) INJECTION
9.9000 | Freq: Once | INTRAVENOUS | Status: AC
  Administered 2023-03-31: 10.04 via INTRAVENOUS

## 2023-04-15 ENCOUNTER — Telehealth: Payer: Self-pay

## 2023-04-15 NOTE — Telephone Encounter (Signed)
 Returned patient's call concerning PET scan not yet read for next appointment. Before calling patient back, this writer did contact the "Scan Reading Room" and spoke with Vicente Serene, who said he would pass along the request to have the scan read in time for patient's next appt on March 25th. This information was shared with patient and his wife over the phone and they both seemed pleased for the effort.

## 2023-04-18 ENCOUNTER — Other Ambulatory Visit: Payer: Self-pay

## 2023-04-21 ENCOUNTER — Other Ambulatory Visit: Payer: Self-pay

## 2023-04-21 ENCOUNTER — Encounter: Payer: Self-pay | Admitting: Oncology

## 2023-04-21 ENCOUNTER — Other Ambulatory Visit (HOSPITAL_COMMUNITY): Payer: Self-pay

## 2023-04-21 ENCOUNTER — Inpatient Hospital Stay: Payer: Medicare Other | Admitting: Oncology

## 2023-04-21 ENCOUNTER — Inpatient Hospital Stay: Payer: Medicare Other | Attending: Oncology

## 2023-04-21 ENCOUNTER — Inpatient Hospital Stay: Payer: Medicare Other

## 2023-04-21 VITALS — BP 140/79 | HR 76 | Temp 98.2°F | Resp 18 | Ht 73.0 in | Wt 204.8 lb

## 2023-04-21 DIAGNOSIS — R748 Abnormal levels of other serum enzymes: Secondary | ICD-10-CM | POA: Diagnosis not present

## 2023-04-21 DIAGNOSIS — R197 Diarrhea, unspecified: Secondary | ICD-10-CM

## 2023-04-21 DIAGNOSIS — Z79899 Other long term (current) drug therapy: Secondary | ICD-10-CM | POA: Insufficient documentation

## 2023-04-21 DIAGNOSIS — C7951 Secondary malignant neoplasm of bone: Secondary | ICD-10-CM

## 2023-04-21 DIAGNOSIS — Z7722 Contact with and (suspected) exposure to environmental tobacco smoke (acute) (chronic): Secondary | ICD-10-CM | POA: Diagnosis not present

## 2023-04-21 DIAGNOSIS — C3412 Malignant neoplasm of upper lobe, left bronchus or lung: Secondary | ICD-10-CM

## 2023-04-21 LAB — CBC WITH DIFFERENTIAL (CANCER CENTER ONLY)
Abs Immature Granulocytes: 0.02 10*3/uL (ref 0.00–0.07)
Basophils Absolute: 0 10*3/uL (ref 0.0–0.1)
Basophils Relative: 1 %
Eosinophils Absolute: 0.3 10*3/uL (ref 0.0–0.5)
Eosinophils Relative: 5 %
HCT: 40.3 % (ref 39.0–52.0)
Hemoglobin: 13.3 g/dL (ref 13.0–17.0)
Immature Granulocytes: 0 %
Lymphocytes Relative: 13 %
Lymphs Abs: 0.6 10*3/uL — ABNORMAL LOW (ref 0.7–4.0)
MCH: 30.7 pg (ref 26.0–34.0)
MCHC: 33 g/dL (ref 30.0–36.0)
MCV: 93.1 fL (ref 80.0–100.0)
Monocytes Absolute: 0.6 10*3/uL (ref 0.1–1.0)
Monocytes Relative: 14 %
Neutro Abs: 3.1 10*3/uL (ref 1.7–7.7)
Neutrophils Relative %: 67 %
Platelet Count: 150 10*3/uL (ref 150–400)
RBC: 4.33 MIL/uL (ref 4.22–5.81)
RDW: 15 % (ref 11.5–15.5)
WBC Count: 4.7 10*3/uL (ref 4.0–10.5)
nRBC: 0 % (ref 0.0–0.2)

## 2023-04-21 LAB — MAGNESIUM: Magnesium: 2 mg/dL (ref 1.7–2.4)

## 2023-04-21 LAB — CMP (CANCER CENTER ONLY)
ALT: 25 U/L (ref 0–44)
AST: 23 U/L (ref 15–41)
Albumin: 4.1 g/dL (ref 3.5–5.0)
Alkaline Phosphatase: 92 U/L (ref 38–126)
Anion gap: 7 (ref 5–15)
BUN: 22 mg/dL (ref 8–23)
CO2: 24 mmol/L (ref 22–32)
Calcium: 8.3 mg/dL — ABNORMAL LOW (ref 8.9–10.3)
Chloride: 111 mmol/L (ref 98–111)
Creatinine: 0.78 mg/dL (ref 0.61–1.24)
GFR, Estimated: >60 mL/min (ref >60)
Glucose, Bld: 102 mg/dL — ABNORMAL HIGH (ref 70–99)
Potassium: 4.5 mmol/L (ref 3.5–5.1)
Sodium: 142 mmol/L (ref 135–145)
Total Bilirubin: 0.4 mg/dL (ref 0.0–1.2)
Total Protein: 6.3 g/dL — ABNORMAL LOW (ref 6.5–8.1)

## 2023-04-21 LAB — LIPID PANEL
Cholesterol: 193 mg/dL (ref 0–200)
HDL: 33 mg/dL — ABNORMAL LOW (ref >40)
LDL Cholesterol: 119 mg/dL — ABNORMAL HIGH (ref 0–99)
Total CHOL/HDL Ratio: 5.8 ratio
Triglycerides: 205 mg/dL — ABNORMAL HIGH (ref <150)
VLDL: 41 mg/dL — ABNORMAL HIGH (ref 0–40)

## 2023-04-21 MED ORDER — LORLATINIB 100 MG PO TABS
100.0000 mg | ORAL_TABLET | Freq: Every day | ORAL | 5 refills | Status: DC
  Filled 2023-04-21 – 2023-04-22 (×2): qty 30, 30d supply, fill #0
  Filled 2023-06-02: qty 30, 30d supply, fill #1
  Filled 2023-07-09: qty 30, 30d supply, fill #2
  Filled 2023-08-05: qty 30, 30d supply, fill #3
  Filled 2023-09-07: qty 30, 30d supply, fill #4

## 2023-04-21 MED ORDER — DENOSUMAB 120 MG/1.7ML ~~LOC~~ SOLN
120.0000 mg | Freq: Once | SUBCUTANEOUS | Status: AC
  Administered 2023-04-21: 120 mg via SUBCUTANEOUS

## 2023-04-21 MED ORDER — DIPHENOXYLATE-ATROPINE 2.5-0.025 MG PO TABS: 1.0000 | ORAL_TABLET | Freq: Four times a day (QID) | ORAL | 1 refills | Status: DC | PRN

## 2023-04-21 NOTE — Assessment & Plan Note (Signed)
 Chronic diarrhea with loose, watery stools multiple times daily, likely medication-related. No associated nausea or vomiting. Weight is stable. Discussed Imodium use and potential side effects like constipation.  -Continue Imodium: two tablets after the first loose bowel movement, then one tablet after each subsequent loose bowel movement, up to eight tablets per day. -Continue Lomotil as needed if diarrhea persist after taking Imodium.

## 2023-04-21 NOTE — Progress Notes (Signed)
 [facility]  ONCOLOGY CLINIC PROGRESS NOTE   Patient Care Team: Sigmund Hazel, MD as PCP - General (Family Medicine) Revankar, Aundra Dubin, MD as PCP - Cardiology (Cardiology) Lanier Prude, MD as PCP - Electrophysiology (Cardiology) Raechel Chute, MD as Consulting Physician (Pulmonary Disease) Lytle Butte, RN as Oncology Nurse Navigator  PATIENT NAME: Gregg Guerrero   MR#: [id number] DOB: Nov 14, 1950  Date of visit: 04/21/2023  ASSESSMENT & PLAN:   DARYAN BUELL is a 73 y.o. 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 No evidence of liver or other visceral involvement. Patient has a history of secondhand smoke exposure. Family history of various cancers.   -Previously I discussed staging, diagnosis, prognosis, plan of care, treatment options with the patient and his wife.  Reviewed NCCN guidelines.  Discussed that all treatment options are palliative in nature and not curative intent, given stage IV disease.  They verbalized understanding.  - Ordered Foundation Medicine testing on the biopsy specimen to identify potential targetable mutations. 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  No other major actionable mutations.  -Given ALK mutation positivity, he would be candidate for targeted therapy.  Plan made to proceed with Lorlatinib 100 mg daily dosing, starting from 01/14/2023.  Side effects include but not limited to: increased blood pressure, edema, changes  in electrolytes, increase in cholesterol and triglycerides, changes in LFTs, peripheral neuropathy, CNS/mood changes, and rare but serious risk for AV block and ILD/pneumonitis.  EKG on 01/13/2023 showed normal sinus rhythm, normal QT interval of 372 ms.  This will be monitored periodically as needed.  -MRI of the brain performed on 01/01/2023 showed no definite evidence of intracranial metastatic disease noted.    -Patient has been compliant with Lorlatinib and tolerating it very well.  Labs today reveal no dose-limiting toxicities.  Alkaline phosphatase decreased from 316 to 92, indicating a positive response to treatment. Calcium levels slightly down at 8.3.  -Given persistent hypocalcemia, I previously advised him to increase calcium supplements to total of 1800 mg daily.  -Restaging PET scan on 03/31/2023 showed resolution of hypermetabolic left upper lobe pulmonary nodule.  Complete resolution of hypermetabolic activity within the skeleton.  No evidence of metastatic lymphadenopathy or visceral metastatic disease.  This is consistent with excellent response.  I reviewed the images with the patient, his wife and his family member who were accompanying today.  They were understandably very happy.  -Plan is to continue Lorlatinib 100 mg daily.  -Given bone metastatic disease, we started him on Xgeva from 01/27/23.  This will be continued every 4 weeks.  Dose given today.  On the PET scan, there is 1 area of focal activity posterior to the right femoral neck, small focus.  This lesion is favored to be unrelated to his lung carcinoma.  We will monitor this on future scans.  Will follow-up on pending lipid panel from this morning.  If needed, we will increase atorvastatin dose.  RTC in 4 weeks for labs, follow-up and Xgeva injection.  Next restaging  PET scan will be obtained approximately 3 months from the last scan, to be ordered on future visits.  Metastasis to bone [facility]) Previously elevated  alkaline phosphatase likely due to bone disease. Discussed the use of Xgeva to prevent fractures and strengthen bones. -Started Xgeva injections from 01/27/23 and continue it monthly.  Dose given today. -Given persistent hypercalcemia, advised him to continue calcium supplements at 1800 mg daily dose.  Diarrhea Chronic diarrhea with loose, watery stools multiple times daily, likely medication-related. No associated nausea or vomiting. Weight is stable. Discussed Imodium use and potential side effects like constipation.  -Continue Imodium: two tablets after the first loose bowel movement, then one tablet after each subsequent loose bowel movement, up to eight tablets per day. -Continue Lomotil as needed if diarrhea persist after taking Imodium.  Patient will be traveling to [place] for a couple of weeks starting next week.  Discussed general precautions during the travel, to avoid infection risk or other complications.  I reviewed lab results and outside records for this visit and discussed relevant results with the patient. Diagnosis, plan of care and treatment options were also discussed in detail with the patient. Opportunity provided to ask questions and answers provided to his apparent satisfaction. Provided instructions to call our clinic with any problems, questions or concerns prior to return visit. I recommended to continue follow-up with PCP and sub-specialists. He verbalized understanding and agreed with the plan.   NCCN guidelines have been consulted in the planning of this patient's care.  I spent a total of 40 minutes during this encounter with the patient including review of chart and various tests results, discussions about plan of care and coordination of care plan.   Meryl Crutch, MD  04/21/2023 1:33 PM  [facility] - [facility] [address] [name] [address] Dept: [phone] Dept Fax:  [phone]    CHIEF COMPLAINT/ REASON FOR VISIT:   Adenocarcinoma of left lung with metastasis to the bones.  Stage IV disease.  ALK mutation positive.  Current Treatment: Lorlatinib 100 mg p.o. daily starting from 01/14/2023.  INTERVAL HISTORY:    Discussed the use of AI scribe software for clinical note transcription with the patient, who gave verbal consent to proceed.   JUANMANUEL MAROHL is here today for repeat clinical assessment.  He started taking Lorlatinib from 01/14/2023. He reports improvement in symptoms since starting the prescribed medication. He notes a significant decrease in nausea and an increase in appetite. The patient also reports a weight gain.  The patient also experiences GI distress, managed with Lomotil, and swelling in the legs. He expresses concerns about medication delivery during an upcoming trip to [place] and inquires about the potential need for antibiotics.  I have reviewed the past medical history, past surgical history, social history and family history with the patient and they are unchanged from previous note.  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 Additionally there were scattered sclerotic bone lesions diffusely along the skeleton, and numerous calcified lymph nodes throughout the mediastinum and left hilum.    12/04/2022 PET scan   PET scan was notable for a 3.9 cm irregular spiculated mass in  the left upper lobe, suspicious for bronchogenic carcinoma. 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  Whole body bone scan confirmed widespread osseous metastases. He denies any associated pain.    12/23/2022 Initial Diagnosis   Malignant neoplasm of upper lobe of left lung [facility]):  He has been under the care of his PCP for what was initially thought to be a pneumonia on CXR. Patient developed shortness of breath in September 2024 and was seen by his primary care physician.  At that time a chest x-ray was done which showed an infiltrate and question of atypical pneumonia.  The patient was treated with antibiotics however his symptoms did not improve.  A CT of the chest was ordered to further evaluate etiology for nonresolving dyspnea.   CT chest on 11/11/2022 which showed a spiculated nodule in the lingula with diffuse interstitial thickening in the left upper lobe that was concerning for an aggressive process or malignancy.  This led to PET/CT, pulmonology referral and additional evaluation.  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 Per his wife the melanoma was low grade and did not require any further treatment after excision.    12/23/2022 Procedure   Patient had initial consultation in pulmonology clinic on 12/17/2022 and underwent navigational bronchoscopy, EBUS and biopsy of left upper lobe lung nodule on 12/23/2022, performed by Dr. Larinda Buttery.  On EBUS, hilar and mediastinal lymph node stations were examined but no lymph nodes were encountered.  This was suspected to be secondary to severe calcification noted on CT scan.   12/23/2022 Pathology Results   Pathology from left upper lobe lung nodule biopsy came back positive for adenocarcinoma.  Brushings from left  upper lobe and lavage showed rare atypical cells.   12/30/2022 Cancer Staging   Staging form: Lung, AJCC 8th Edition - Clinical stage from 12/30/2022: Stage IVB (cT2a, cN0, cM1c) - Signed by Meryl Crutch, MD on 12/30/2022 Stage prefix: Initial diagnosis    Miscellaneous   Request submitted for Foundation Medicine testing on the specimen and Guardant 360 (liquid biopsy) for any actionable mutations and also for PD-L1 testing.  Treatment decisions to be made pending these results.   01/05/2023 Pathology Results   Foundation One CDx: ALK + EML4-ALK fusion (Variant 1)  PD-L1 TPS 5%  DNMT3A P904L mutation noted (unknown significance in his tumor type).    01/06/2023 -  Chemotherapy   Patient is on Treatment Plan : LUNG NSCLC Lorlatinib q28d     03/31/2023 PET scan   IMPRESSION: 1. Resolution of hypermetabolic LEFT upper lobe pulmonary nodule. 2. Complete resolution of hypermetabolic activity within the skeleton. Underlying diffuse sclerotic skeletal disease remains consistent with treated metastasis. 3. No evidence of metastatic adenopathy or visceral metastasis. 4. Intense of focal activity posterior to the RIGHT femoral neck. This localizes to small focus of extra osseous soft tissue. This lesion is hypermetabolic on comparison exam with no interval change. Favor lesion unrelated to lung carcinoma. Indeterminate activity. Consider MRI of the RIGHT hip for further evaluation.  REVIEW OF SYSTEMS:   Review of Systems - Oncology  All other pertinent systems were reviewed with the patient and are negative.  ALLERGIES: He has no known allergies.  MEDICATIONS:  Current Outpatient Medications  Medication Sig Dispense Refill   atorvastatin (LIPITOR) 10 MG tablet Take 10 mg by mouth at bedtime.     benzonatate (TESSALON) 200 MG capsule Take 200 mg by mouth 3 (three) times daily as needed for cough.     Calcium Carbonate-Vit D-Min (CALCIUM 1200 PO) Take 750 mg by mouth in the morning,  at noon, and at bedtime. Patient is working towards increasing to 1200 mgs 5 times a day     Cholecalciferol (DIALYVITE VITAMIN D 5000) 125 MCG (5000 UT) capsule Take 1,500 Units by mouth every Saturday.     Cholecalciferol 125 MCG (5000 UT) capsule Take 5,000 Units by mouth daily.     clonazePAM (KLONOPIN) 1 MG tablet Take 1 mg by mouth at bedtime.     clopidogrel (PLAVIX) 75 MG tablet Take 75 mg by mouth daily.     Ferrous Sulfate (IRON) 325 (65 FE) MG TABS Take 325 mg by mouth daily.     fluticasone (FLONASE) 50 MCG/ACT nasal spray Place 1 spray into both nostrils daily as needed for allergies.     latanoprost (XALATAN) 0.005 % ophthalmic solution Place 1 drop into both eyes at bedtime.     loratadine (CLARITIN) 10 MG tablet Take 10 mg by mouth at bedtime.     MAGNESIUM PO Take 1 tablet by mouth daily.     Multiple Vitamin (MULTIVITAMIN) tablet Take 1 tablet by mouth daily.     ondansetron (ZOFRAN-ODT) 8 MG disintegrating tablet Take 1 tablet (8 mg total) by mouth every 8 (eight) hours as needed for nausea or vomiting. 60 tablet 3   PREVIDENT 5000 BOOSTER PLUS 1.1 % PSTE Take 1 Application by mouth once as needed (Dry mouth).     traZODone (DESYREL) 50 MG tablet Take 25 mg by mouth at bedtime.     diphenoxylate-atropine (LOMOTIL) 2.5-0.025 MG tablet Take 1 tablet by mouth 4 (four) times daily as needed for diarrhea or loose stools. 60 tablet 1   lorlatinib (LORBRENA) 100 MG tablet Take 1 tablet (100 mg total) by mouth daily. Swallow tablets whole. Do not chew, crush or split tablets. 28 tablet 5   No current facility-administered medications for this visit.     VITALS:   Blood pressure (!) 140/79, pulse 76, temperature 98.2 F (36.8 C), temperature source Oral, resp. rate 18, height 6\' 1"  (1.854 m), weight 204 lb 12.8 oz (92.9 kg), SpO2 97%.  Wt Readings from Last 3 Encounters:  04/21/23 204 lb 12.8 oz (92.9 kg)  03/24/23 198 lb 9.6 oz (90.1 kg)  02/24/23 195 lb 11.2 oz (88.8 kg)     Body mass index is 27.02 kg/m.   Onc Performance Status - 04/21/23 1120       ECOG Perf Status   ECOG Perf Status Restricted in physically strenuous activity but ambulatory and able to carry out work of a light or sedentary nature, e.g., light house work, office work               PHYSICAL EXAM:   Physical Exam Constitutional:      General: He is not in acute distress.    Appearance: Normal appearance.  HENT:     Head: Normocephalic and atraumatic.  Eyes:     General: No scleral icterus.  Conjunctiva/sclera: Conjunctivae normal.  Cardiovascular:     Rate and Rhythm: Normal rate and regular rhythm.     Heart sounds: Normal heart sounds.  Pulmonary:     Effort: Pulmonary effort is normal.     Breath sounds: Normal breath sounds.  Abdominal:     General: There is no distension.  Musculoskeletal:     Right lower leg: Edema (1+) present.     Left lower leg: Edema (1+) present.  Lymphadenopathy:     Cervical: No cervical adenopathy.  Neurological:     General: No focal deficit present.     Mental Status: He is alert and oriented to person, place, and time.  Psychiatric:        Mood and Affect: Mood normal.        Behavior: Behavior normal.        Thought Content: Thought content normal.       LABORATORY DATA:   I have reviewed the data as listed.  Results for orders placed or performed in visit on 04/21/23  Magnesium  Result Value Ref Range   Magnesium 2.0 1.7 - 2.4 mg/dL  CMP (Cancer Center only)  Result Value Ref Range   Sodium 142 135 - 145 mmol/L   Potassium 4.5 3.5 - 5.1 mmol/L   Chloride 111 98 - 111 mmol/L   CO2 24 22 - 32 mmol/L   Glucose, Bld 102 (H) 70 - 99 mg/dL   BUN 22 8 - 23 mg/dL   Creatinine 1.61 0.96 - 1.24 mg/dL   Calcium 8.3 (L) 8.9 - 10.3 mg/dL   Total Protein 6.3 (L) 6.5 - 8.1 g/dL   Albumin 4.1 3.5 - 5.0 g/dL   AST 23 15 - 41 U/L   ALT 25 0 - 44 U/L   Alkaline Phosphatase 92 38 - 126 U/L   Total Bilirubin 0.4 0.0 - 1.2  mg/dL   GFR, Estimated >04 >54 mL/min   Anion gap 7 5 - 15  CBC with Differential (Cancer Center Only)  Result Value Ref Range   WBC Count 4.7 4.0 - 10.5 K/uL   RBC 4.33 4.22 - 5.81 MIL/uL   Hemoglobin 13.3 13.0 - 17.0 g/dL   HCT 09.8 11.9 - 14.7 %   MCV 93.1 80.0 - 100.0 fL   MCH 30.7 26.0 - 34.0 pg   MCHC 33.0 30.0 - 36.0 g/dL   RDW 82.9 56.2 - 13.0 %   Platelet Count 150 150 - 400 K/uL   nRBC 0.0 0.0 - 0.2 %   Neutrophils Relative % 67 %   Neutro Abs 3.1 1.7 - 7.7 K/uL   Lymphocytes Relative 13 %   Lymphs Abs 0.6 (L) 0.7 - 4.0 K/uL   Monocytes Relative 14 %   Monocytes Absolute 0.6 0.1 - 1.0 K/uL   Eosinophils Relative 5 %   Eosinophils Absolute 0.3 0.0 - 0.5 K/uL   Basophils Relative 1 %   Basophils Absolute 0.0 0.0 - 0.1 K/uL   Immature Granulocytes 0 %   Abs Immature Granulocytes 0.02 0.00 - 0.07 K/uL      RADIOGRAPHIC STUDIES:  NM PET Image Restag (PS) Skull Base To Thigh CLINICAL DATA:  Subsequent treatment strategy for lung carcinoma with bone metastasis.  EXAM: NUCLEAR MEDICINE PET SKULL BASE TO THIGH  TECHNIQUE: 10.4 mCi F-18 FDG was injected intravenously. Full-ring PET imaging was performed from the skull base to thigh after the radiotracer. CT data was obtained and used for attenuation correction and anatomic localization.  Fasting blood glucose: 101 mg/dl  COMPARISON:  PET-CT 47/82/9562  FINDINGS: NECK: No hypermetabolic lymph nodes in the neck.  Incidental CT findings: None.  CHEST: Interval resolution of the previous seen hypermetabolic mildly hypermetabolic the template pulmonary nodule. No residual hypermetabolic nodules remain.  There is again demonstrated multiple calcified mediastinal lymph nodes unchanged from prior. No hypermetabolic mediastinal lymph nodes.  Incidental CT findings: None.  ABDOMEN/PELVIS: No abnormal hypermetabolic activity within the liver, pancreas, adrenal glands, or spleen. No hypermetabolic lymph nodes in  the abdomen or pelvis.  Incidental CT findings: None.  SKELETON: Complete resolution of the hypermetabolic activity previously seen throughout the skeleton. There is underlying sclerotic lesions occupying every bone imaged. Again no hypermetabolic activity.  There is 1 focus of intense metabolic activity posterior to the RIGHT femoral neck. Small soft tissue nodule at the site. (image 205) there is a focus of metabolic activity on comparison exam at same site with equal intensity (SUV max equal 16)  Incidental CT findings: None.  IMPRESSION: 1. Resolution of hypermetabolic LEFT upper lobe pulmonary nodule. 2. Complete resolution of hypermetabolic activity within the skeleton. Underlying diffuse sclerotic skeletal disease remains consistent with treated metastasis. 3. No evidence of metastatic adenopathy or visceral metastasis. 4. Intense of focal activity posterior to the RIGHT femoral neck. This localizes to small focus of extra osseous soft tissue. This lesion is hypermetabolic on comparison exam with no interval change. Favor lesion unrelated to lung carcinoma. Indeterminate activity. Consider MRI of the RIGHT hip for further evaluation.  Electronically Signed   By: Genevive Bi M.D.   On: 04/20/2023 12:54    CODE STATUS:  Code Status History     Date Active Date Inactive Code Status Order ID Comments User Context   07/20/2014 0933 07/21/2014 1200 Full Code [id number]  Julio Sicks, MD Inpatient      Advance Directive Documentation    Flowsheet Row Most Recent Value  Type of Advance Directive Healthcare Power of Attorney, Living will  Pre-existing out of facility DNR order (yellow form or pink MOST form) --  "MOST" Form in Place? --       No orders of the defined types were placed in this encounter.    No future appointments.     This document was completed utilizing speech recognition software. Grammatical errors, random word insertions, pronoun errors,  and incomplete sentences are an occasional consequence of this system due to software limitations, ambient noise, and hardware issues. Any formal questions or concerns about the content, text or information contained within the body of this dictation should be directly addressed to the provider for clarification.

## 2023-04-21 NOTE — Patient Instructions (Signed)
 Denosumab Injection (Oncology) What is this medication? DENOSUMAB (den oh SUE mab) prevents weakened bones caused by cancer. It may also be used to treat noncancerous bone tumors that cannot be removed by surgery. It can also be used to treat high calcium levels in the blood caused by cancer. It works by blocking a protein that causes bones to break down quickly. This slows down the release of calcium from bones, which lowers calcium levels in your blood. It also makes your bones stronger and less likely to break (fracture). This medicine may be used for other purposes; ask your health care provider or pharmacist if you have questions. COMMON BRAND NAME(S): XGEVA What should I tell my care team before I take this medication? They need to know if you have any of these conditions: Dental disease Having surgery or tooth extraction Infection Kidney disease Low levels of calcium or vitamin D in the blood Malnutrition On hemodialysis Skin conditions or sensitivity Thyroid or parathyroid disease An unusual reaction to denosumab, other medications, foods, dyes, or preservatives Pregnant or trying to get pregnant Breast-feeding How should I use this medication? This medication is for injection under the skin. It is given by your care team in a hospital or clinic setting. A special MedGuide will be given to you before each treatment. Be sure to read this information carefully each time. Talk to your care team about the use of this medication in children. While it may be prescribed for children as young as 13 years for selected conditions, precautions do apply. Overdosage: If you think you have taken too much of this medicine contact a poison control center or emergency room at once. NOTE: This medicine is only for you. Do not share this medicine with others. What if I miss a dose? Keep appointments for follow-up doses. It is important not to miss your dose. Call your care team if you are unable to  keep an appointment. What may interact with this medication? Do not take this medication with any of the following: Other medications containing denosumab This medication may also interact with the following: Medications that lower your chance of fighting infection Steroid medications, such as prednisone or cortisone This list may not describe all possible interactions. Give your health care provider a list of all the medicines, herbs, non-prescription drugs, or dietary supplements you use. Also tell them if you smoke, drink alcohol, or use illegal drugs. Some items may interact with your medicine. What should I watch for while using this medication? Your condition will be monitored carefully while you are receiving this medication. You may need blood work while taking this medication. This medication may increase your risk of getting an infection. Call your care team for advice if you get a fever, chills, sore throat, or other symptoms of a cold or flu. Do not treat yourself. Try to avoid being around people who are sick. You should make sure you get enough calcium and vitamin D while you are taking this medication, unless your care team tells you not to. Discuss the foods you eat and the vitamins you take with your care team. Some people who take this medication have severe bone, joint, or muscle pain. This medication may also increase your risk for jaw problems or a broken thigh bone. Tell your care team right away if you have severe pain in your jaw, bones, joints, or muscles. Tell your care team if you have any pain that does not go away or that gets worse. Talk  to your care team if you may be pregnant. Serious birth defects can occur if you take this medication during pregnancy and for 5 months after the last dose. You will need a negative pregnancy test before starting this medication. Contraception is recommended while taking this medication and for 5 months after the last dose. Your care team  can help you find the option that works for you. What side effects may I notice from receiving this medication? Side effects that you should report to your care team as soon as possible: Allergic reactions--skin rash, itching, hives, swelling of the face, lips, tongue, or throat Bone, joint, or muscle pain Low calcium level--muscle pain or cramps, confusion, tingling, or numbness in the hands or feet Osteonecrosis of the jaw--pain, swelling, or redness in the mouth, numbness of the jaw, poor healing after dental work, unusual discharge from the mouth, visible bones in the mouth Side effects that usually do not require medical attention (report to your care team if they continue or are bothersome): Cough Diarrhea Fatigue Headache Nausea This list may not describe all possible side effects. Call your doctor for medical advice about side effects. You may report side effects to FDA at 1-800-FDA-1088. Where should I keep my medication? This medication is given in a hospital or clinic. It will not be stored at home. NOTE: This sheet is a summary. It may not cover all possible information. If you have questions about this medicine, talk to your doctor, pharmacist, or health care provider.  2024 Elsevier/Gold Standard (2021-06-05 00:00:00)

## 2023-04-21 NOTE — Progress Notes (Signed)
 Patient seen by Dr. Archie Patten Pasam today  Vitals are within treatment parameters:Yes   Labs are within treatment parameters: Yes   Treatment plan has been signed: Yes   Per physician team, Patient is ready for treatment and there are NO modifications to the treatment plan.  "Patient is good for the injection."

## 2023-04-21 NOTE — Assessment & Plan Note (Addendum)
 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 No evidence of liver or other visceral involvement. Patient has a history of secondhand smoke exposure. Family history of various cancers.   -Previously I discussed staging, diagnosis, prognosis, plan of care, treatment options with the patient and his wife.  Reviewed NCCN guidelines.  Discussed that all treatment options are palliative in nature and not curative intent, given stage IV disease.  They verbalized understanding.  - Ordered Foundation Medicine testing on the biopsy specimen to identify potential targetable mutations. 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  No other major actionable mutations.  -Given ALK mutation positivity, he would be candidate for targeted therapy.  Plan made to proceed with Lorlatinib 100 mg daily dosing, starting from 01/14/2023.  Side effects include but not limited to: increased blood pressure, edema, changes in electrolytes, increase in cholesterol and triglycerides, changes in LFTs, peripheral neuropathy, CNS/mood changes, and rare but serious risk for AV block and ILD/pneumonitis.  EKG on 01/13/2023 showed normal sinus rhythm, normal QT interval of 372 ms.  This will be monitored periodically as needed.  -MRI of the brain performed on 01/01/2023 showed no definite evidence of intracranial metastatic disease noted.    -Patient has been compliant with Lorlatinib and tolerating it very well.  Labs today reveal no dose-limiting toxicities.  Alkaline phosphatase decreased from 316 to 92, indicating a positive response to treatment. Calcium levels slightly down at 8.3.  -Given persistent hypocalcemia, I previously advised him to increase calcium supplements to total of 1800 mg daily.  -Restaging PET scan on 03/31/2023 showed resolution of hypermetabolic left upper lobe  pulmonary nodule.  Complete resolution of hypermetabolic activity within the skeleton.  No evidence of metastatic lymphadenopathy or visceral metastatic disease.  This is consistent with excellent response.  I reviewed the images with the patient, his wife and his family member who were accompanying today.  They were understandably very happy.  -Plan is to continue Lorlatinib 100 mg daily.  -Given bone metastatic disease, we started him on Xgeva from 01/27/23.  This will be continued every 4 weeks.  Dose given today.  On the PET scan, there is 1 area of focal activity posterior to the right femoral neck, small focus.  This lesion is favored to be unrelated to his lung carcinoma.  We will monitor this on future scans.  Will follow-up on pending lipid panel from this morning.  If needed, we will increase atorvastatin dose.  RTC in 4 weeks for labs, follow-up and Xgeva injection.  Next restaging PET scan will be obtained approximately 3 months from the last scan, to be ordered on future visits.

## 2023-04-21 NOTE — Assessment & Plan Note (Signed)
 Previously elevated alkaline phosphatase likely due to bone disease. Discussed the use of Xgeva to prevent fractures and strengthen bones. -Started Xgeva injections from 01/27/23 and continue it monthly.  Dose given today. -Given persistent hypercalcemia, advised him to continue calcium supplements at 1800 mg daily dose.

## 2023-04-22 ENCOUNTER — Other Ambulatory Visit: Payer: Self-pay

## 2023-04-22 ENCOUNTER — Other Ambulatory Visit: Payer: Self-pay | Admitting: Pharmacy Technician

## 2023-04-22 ENCOUNTER — Other Ambulatory Visit (HOSPITAL_COMMUNITY): Payer: Self-pay

## 2023-04-22 NOTE — Progress Notes (Signed)
 Specialty Pharmacy Refill Coordination Note  Gregg Guerrero is a 73 y.o. male contacted today regarding refills of specialty medication(s) Lorlatinib [name])   Patient requested Delivery   Delivery date: 05/12/23   Verified address: [address]  [place]   Medication will be filled on 05/11/23.

## 2023-05-14 ENCOUNTER — Encounter: Payer: Self-pay | Admitting: *Deleted

## 2023-05-14 ENCOUNTER — Telehealth: Payer: Self-pay | Admitting: *Deleted

## 2023-05-14 NOTE — Progress Notes (Signed)
 PATIENT NAVIGATOR PROGRESS NOTE  Name: Gregg Guerrero Date: 05/14/2023 MRN: [id number]  DOB: [birth year]   Reason for visit:  Patient telephone call  Comments:  Patient called and described bilateral lower extremity swelling that started on 05/01/23 when traveling by plane. No pain described, no redness, no history of clots.  Pt has tried elevation, low sodium diet, compression stockings and applying ice with no improvement.  He states that the swelling does not decrease overnight either.  Discussed with Dr Scherrie Curt and he recommends that patient hold Lobrena until appt with Dr Randye Buttner on 05/18/23 and that pt should seek emergency room care if he experiences shortness of breath, pain in legs or worsening symptoms  Pt verbalizes understanding    Time spent counseling/coordinating care: 30-45 minutes

## 2023-05-14 NOTE — Telephone Encounter (Signed)
See narrative

## 2023-05-18 ENCOUNTER — Inpatient Hospital Stay: Attending: Oncology

## 2023-05-18 ENCOUNTER — Inpatient Hospital Stay: Admitting: Oncology

## 2023-05-18 ENCOUNTER — Encounter: Payer: Self-pay | Admitting: Oncology

## 2023-05-18 ENCOUNTER — Inpatient Hospital Stay

## 2023-05-18 ENCOUNTER — Other Ambulatory Visit (HOSPITAL_BASED_OUTPATIENT_CLINIC_OR_DEPARTMENT_OTHER): Payer: Self-pay

## 2023-05-18 VITALS — BP 138/81 | HR 76 | Temp 98.0°F | Resp 18 | Wt 204.6 lb

## 2023-05-18 DIAGNOSIS — Z7722 Contact with and (suspected) exposure to environmental tobacco smoke (acute) (chronic): Secondary | ICD-10-CM | POA: Insufficient documentation

## 2023-05-18 DIAGNOSIS — C3412 Malignant neoplasm of upper lobe, left bronchus or lung: Secondary | ICD-10-CM

## 2023-05-18 DIAGNOSIS — C7951 Secondary malignant neoplasm of bone: Secondary | ICD-10-CM

## 2023-05-18 DIAGNOSIS — R600 Localized edema: Secondary | ICD-10-CM | POA: Diagnosis not present

## 2023-05-18 LAB — COMPREHENSIVE METABOLIC PANEL WITH GFR
ALT: 27 U/L (ref 0–44)
AST: 23 U/L (ref 15–41)
Albumin: 4.3 g/dL (ref 3.5–5.0)
Alkaline Phosphatase: 98 U/L (ref 38–126)
Anion gap: 6 (ref 5–15)
BUN: 22 mg/dL (ref 8–23)
CO2: 24 mmol/L (ref 22–32)
Calcium: 9.1 mg/dL (ref 8.9–10.3)
Chloride: 111 mmol/L (ref 98–111)
Creatinine, Ser: 0.96 mg/dL (ref 0.61–1.24)
GFR, Estimated: >60 mL/min (ref >60)
Glucose, Bld: 113 mg/dL — ABNORMAL HIGH (ref 70–99)
Potassium: 4.8 mmol/L (ref 3.5–5.1)
Sodium: 141 mmol/L (ref 135–145)
Total Bilirubin: 0.4 mg/dL (ref 0.0–1.2)
Total Protein: 6.6 g/dL (ref 6.5–8.1)

## 2023-05-18 LAB — CBC WITH DIFFERENTIAL/PLATELET
Abs Immature Granulocytes: 0.01 10*3/uL (ref 0.00–0.07)
Basophils Absolute: 0 10*3/uL (ref 0.0–0.1)
Basophils Relative: 0 %
Eosinophils Absolute: 0.2 10*3/uL (ref 0.0–0.5)
Eosinophils Relative: 5 %
HCT: 41.6 % (ref 39.0–52.0)
Hemoglobin: 13.9 g/dL (ref 13.0–17.0)
Immature Granulocytes: 0 %
Lymphocytes Relative: 14 %
Lymphs Abs: 0.5 10*3/uL — ABNORMAL LOW (ref 0.7–4.0)
MCH: 30.3 pg (ref 26.0–34.0)
MCHC: 33.4 g/dL (ref 30.0–36.0)
MCV: 90.8 fL (ref 80.0–100.0)
Monocytes Absolute: 0.5 10*3/uL (ref 0.1–1.0)
Monocytes Relative: 13 %
Neutro Abs: 2.6 10*3/uL (ref 1.7–7.7)
Neutrophils Relative %: 68 %
Platelets: 155 10*3/uL (ref 150–400)
RBC: 4.58 MIL/uL (ref 4.22–5.81)
RDW: 13.4 % (ref 11.5–15.5)
WBC: 3.8 10*3/uL — ABNORMAL LOW (ref 4.0–10.5)
nRBC: 0 % (ref 0.0–0.2)

## 2023-05-18 LAB — MAGNESIUM: Magnesium: 2 mg/dL (ref 1.7–2.4)

## 2023-05-18 MED ORDER — DENOSUMAB 120 MG/1.7ML ~~LOC~~ SOLN
120.0000 mg | Freq: Once | SUBCUTANEOUS | Status: AC
  Administered 2023-05-18: 120 mg via SUBCUTANEOUS

## 2023-05-18 MED ORDER — FUROSEMIDE 20 MG PO TABS
20.0000 mg | ORAL_TABLET | Freq: Every day | ORAL | 3 refills | Status: DC | PRN
  Filled 2023-05-18: qty 30, 30d supply, fill #0
  Filled 2023-06-09 – 2023-06-10 (×2): qty 30, 30d supply, fill #1
  Filled 2023-07-03: qty 30, 30d supply, fill #2
  Filled 2023-08-05: qty 30, 30d supply, fill #3

## 2023-05-18 NOTE — Progress Notes (Signed)
 Patient seen by Dr. Gale Jude Pasam today  Vitals are within treatment parameters:Yes   Labs are within treatment parameters: Yes   Treatment plan has been signed: Yes   Per physician team, Patient is ready for treatment and there are NO modifications to the treatment plan.  Patient is good to have his XGEVA  today.

## 2023-05-18 NOTE — Assessment & Plan Note (Addendum)
 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 No evidence of liver or other visceral involvement. Patient has a history of secondhand smoke exposure. Family history of various cancers.   -Previously I discussed staging, diagnosis, prognosis, plan of care, treatment options with the patient and his wife.  Reviewed NCCN guidelines.  Discussed that all treatment options are palliative in nature and not curative intent, given stage IV disease.  They verbalized understanding.  - Ordered [facility] testing on the biopsy specimen to identify potential targetable mutations. 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  No other major actionable mutations.  -Given ALK mutation positivity, he would be candidate for targeted therapy.  Plan made to proceed with Lorlatinib  100 mg daily dosing, starting from 01/14/2023.  Side effects include but not limited to: increased blood pressure, edema, changes in electrolytes, increase in cholesterol and triglycerides, changes in LFTs, peripheral neuropathy, CNS/mood changes, and rare but serious risk for AV block and ILD/pneumonitis.  EKG on 01/13/2023 showed normal sinus rhythm, normal QT interval of 372 ms.  This will be monitored periodically as needed.  -MRI of the brain performed on 01/01/2023 showed no definite evidence of intracranial metastatic disease noted.    -Patient has been compliant with Lorlatinib  and tolerating it very well.  Labs today reveal no dose-limiting toxicities.  Alkaline phosphatase decreased from 316 to 92, indicating a positive response to treatment. Calcium levels slightly down at 8.3.  -Given persistent hypocalcemia, I previously advised him to increase calcium supplements to total of 1800 mg daily.  -Restaging PET scan on 03/31/2023 showed resolution of hypermetabolic left upper lobe  pulmonary nodule.  Complete resolution of hypermetabolic activity within the skeleton.  No evidence of metastatic lymphadenopathy or visceral metastatic disease.  This is consistent with excellent response.  I previously reviewed the images with the patient, his wife and his family member who were accompanying.  They were understandably very happy.  After his recent trip to [place], patient developed swelling in both legs around ankles.  As per our instructions, he held Lorlatinib  from 05/14/2023.  Will start him on Lasix  20 mg daily as needed.  -Plan is to continue Lorlatinib  100 mg daily.  He was advised to resume Lorlatinib .  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  This will be continued every 4 weeks.  Dose given today.  On the PET scan, there is 1 area of focal activity posterior to the right femoral neck, small focus.  This lesion is favored to be unrelated to his lung carcinoma.  We will monitor this on future scans.  Recent lipid panels showed stable cholesterol levels overall.  If needed, we will increase atorvastatin dose.  RTC in 4 weeks for labs, follow-up and Xgeva  injection.  Next restaging PET scan will be obtained approximately 3 months from the last scan, to be ordered on future visits.

## 2023-05-18 NOTE — Assessment & Plan Note (Addendum)
 Previously elevated alkaline phosphatase likely due to bone disease. Discussed the use of Xgeva  to prevent fractures and strengthen bones. -Started Xgeva  injections from 01/27/23 and continue it monthly.  Dose given today. -Given persistent hypocalcemia previously, advised him to continue calcium supplements at 1800 mg daily dose.

## 2023-05-18 NOTE — Progress Notes (Signed)
 [facility]  ONCOLOGY CLINIC PROGRESS NOTE   Patient Care Team: Perley Bradley, MD as PCP - General (Family Medicine) Revankar, Micael Adas, MD as PCP - Cardiology (Cardiology) Boyce Byes, MD as PCP - Electrophysiology (Cardiology) Vergia Glasgow, MD as Consulting Physician (Pulmonary Disease) Rosalita Combe, RN as Oncology Nurse Navigator  PATIENT NAME: Gregg Guerrero   MR#: [id number] DOB: [birth year]  Date of visit: 05/18/2023  ASSESSMENT & PLAN:   Gregg Guerrero is a 73 y.o. 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 No evidence of liver or other visceral involvement. Patient has a history of secondhand smoke exposure. Family history of various cancers.   -Previously I discussed staging, diagnosis, prognosis, plan of care, treatment options with the patient and his wife.  Reviewed NCCN guidelines.  Discussed that all treatment options are palliative in nature and not curative intent, given stage IV disease.  They verbalized understanding.  - Ordered Foundation Medicine testing on the biopsy specimen to identify potential targetable mutations. 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  No other major actionable mutations.  -Given ALK mutation positivity, he would be candidate for targeted therapy.  Plan made to proceed with Lorlatinib  100 mg daily dosing, starting from 01/14/2023.  Side effects include but not limited to: increased blood pressure, edema, changes  in electrolytes, increase in cholesterol and triglycerides, changes in LFTs, peripheral neuropathy, CNS/mood changes, and rare but serious risk for AV block and ILD/pneumonitis.  EKG on 01/13/2023 showed normal sinus rhythm, normal QT interval of 372 ms.  This will be monitored periodically as needed.  -MRI of the brain performed on 01/01/2023 showed no definite evidence of intracranial metastatic disease noted.    -Patient has been compliant with Lorlatinib  and tolerating it very well.  Labs today reveal no dose-limiting toxicities.  Alkaline phosphatase decreased from 316 to 92, indicating a positive response to treatment. Calcium levels slightly down at 8.3.  -Given persistent hypocalcemia, I previously advised him to increase calcium supplements to total of 1800 mg daily.  -Restaging PET scan on 03/31/2023 showed resolution of hypermetabolic left upper lobe pulmonary nodule.  Complete resolution of hypermetabolic activity within the skeleton.  No evidence of metastatic lymphadenopathy or visceral metastatic disease.  This is consistent with excellent response.  I previously reviewed the images with the patient, his wife and his family member who were accompanying.  They were understandably very happy.  After his recent trip to [place], patient developed swelling in both legs around ankles.  As per our instructions, he held Lorlatinib  from 05/14/2023.  Will start him on Lasix  20 mg daily as needed.  -Plan is to continue Lorlatinib  100 mg daily.  He was advised to resume Lorlatinib .  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  This will be continued every 4 weeks.  Dose given today.  On the PET scan, there is 1 area of focal activity posterior to the right femoral neck, small focus.  This lesion is favored to be unrelated to  his lung carcinoma.  We will monitor this on future scans.  Recent lipid panels showed stable cholesterol levels overall.  If needed, we will increase  atorvastatin dose.  RTC in 4 weeks for labs, follow-up and Xgeva  injection.  Next restaging PET scan will be obtained approximately 3 months from the last scan, to be ordered on future visits.  Metastasis to bone [facility]) Previously elevated alkaline phosphatase likely due to bone disease. Discussed the use of Xgeva  to prevent fractures and strengthen bones. -Started Xgeva  injections from 01/27/23 and continue it monthly.  Dose given today. -Given persistent hypocalcemia previously, advised him to continue calcium supplements at 1800 mg daily dose.  Pedal edema Bilateral lower extremity edema causing discomfort. Symmetrical and not associated with redness, ruling out thrombosis. Normal protein, albumin, kidney function, and calcium levels. Likely a side effect of Lorlatinib , known to cause swelling in about 50% of patients. Compression socks and conservative measures ineffective. Affecting quality of life. - Prescribe Lasix  (furosemide ) 20 mg once daily as needed for swelling. - Advise continued hydration and current conservative measures such as compression socks and elevation.     I reviewed lab results and outside records for this visit and discussed relevant results with the patient. Diagnosis, plan of care and treatment options were also discussed in detail with the patient. Opportunity provided to ask questions and answers provided to his apparent satisfaction. Provided instructions to call our clinic with any problems, questions or concerns prior to return visit. I recommended to continue follow-up with PCP and sub-specialists. He verbalized understanding and agreed with the plan.   NCCN guidelines have been consulted in the planning of this patient's care.  I spent a total of 40 minutes during this encounter with the patient including review of chart and various tests results, discussions about plan of care and coordination of care plan.   Gregg Berber, MD  05/18/2023 5:01 PM  [facility] - A DEPT OF [facility] [address] [name] [address] Dept: [phone] Dept Fax: [phone]    CHIEF COMPLAINT/ REASON FOR VISIT:   Adenocarcinoma of left lung with metastasis to the bones.  Stage IV disease.  ALK mutation positive.  Current Treatment: Lorlatinib  100 mg p.o. daily starting from 01/14/2023.  INTERVAL HISTORY:    Discussed the use of AI scribe software for clinical note transcription with the patient, who gave verbal consent to proceed.   Gregg Guerrero is here today for repeat clinical assessment.  He started taking Lorlatinib  from 01/14/2023.   History of Present Illness He has experienced persistent swelling in his legs, particularly around the knees and feet, since returning from a trip last week. The swelling is symmetrical, without associated redness, and has significantly impacted his quality of life by making walking uncomfortable. Various interventions, including elevation, ice, Tylenol , exercise, and compression socks, have not provided relief. At one point, the swelling was significant enough to obscure his toes, which he described as 'sausage toes'.  He also experiences joint pain and stiffness, particularly in his right finger, which locks in the morning and requires manipulation to move. This symptom began in the middle of last week, and another finger experienced similar locking once in the past week.  He held Lorlatinib  since 05/14/2023 as per our instructions.  He has not experienced any headaches or vision problems, although he mentions a sensation of tightness in his head at night, which resolves when he lies down.  His current medications include atorvastatin for cholesterol management, which he continues to take at the same dose. He has not had any migraines since starting his current medication regimen, despite a lifelong history of migraines.   I have  reviewed the past medical history, past surgical history, social history and family history with the patient and they are unchanged from previous note.  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 Additionally there were scattered sclerotic bone lesions diffusely along the skeleton, and numerous calcified lymph nodes throughout the mediastinum and left hilum.    12/04/2022 PET scan   PET scan was notable for a 3.9 cm irregular spiculated mass in the left upper lobe, suspicious for bronchogenic carcinoma. 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  Whole body bone scan confirmed widespread osseous metastases. He denies any associated pain.    12/23/2022 Initial Diagnosis   Malignant neoplasm of upper lobe of left lung [facility]):  He has been under the care of his PCP for what was initially thought to be a pneumonia on CXR. Patient developed shortness of breath in September 2024 and was seen by his primary care physician.  At that time a chest x-ray was done which showed an infiltrate and question of atypical pneumonia.  The patient was treated with antibiotics however his symptoms did not improve.  A CT of the chest was ordered to further evaluate etiology for nonresolving dyspnea.   CT chest on 11/11/2022  which showed a spiculated nodule in the lingula with diffuse interstitial thickening in the left upper lobe that was concerning for an aggressive process or malignancy.  This led to PET/CT, pulmonology referral and additional evaluation.  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 Per his wife the melanoma was low grade and did not require any further treatment after excision.    12/23/2022 Procedure   Patient had initial consultation in pulmonology clinic on 12/17/2022 and underwent navigational bronchoscopy, EBUS and biopsy of left upper lobe lung nodule on 12/23/2022, performed by Dr. Lucina Sabal.  On EBUS, hilar and mediastinal lymph node stations were examined but no lymph nodes were encountered.  This was suspected to be secondary to severe calcification noted on CT scan.   12/23/2022 Pathology Results   Pathology from left upper lobe lung nodule biopsy came back positive for adenocarcinoma.  Brushings from left upper lobe and lavage showed rare atypical cells.   12/30/2022 Cancer Staging   Staging form: Lung, AJCC 8th Edition - Clinical stage from 12/30/2022: Stage IVB (cT2a, cN0, cM1c) - Signed by Gregg Berber, MD on 12/30/2022 Stage prefix: Initial diagnosis    Miscellaneous   Request submitted for Foundation Medicine testing on the specimen and Guardant 360 (liquid biopsy) for any actionable mutations and also for  PD-L1 testing.  Treatment decisions to be made pending these results.   01/05/2023 Pathology Results   Foundation One CDx: ALK + EML4-ALK fusion (Variant 1)  PD-L1 TPS 5%  DNMT3A P904L mutation noted (unknown significance in his tumor type).    01/06/2023 -  Chemotherapy   Patient is on Treatment Plan : LUNG NSCLC Lorlatinib  q28d     03/31/2023 PET scan   IMPRESSION: 1. Resolution of hypermetabolic LEFT upper lobe pulmonary nodule. 2. Complete resolution of hypermetabolic activity within the  skeleton. Underlying diffuse sclerotic skeletal disease remains consistent with treated metastasis. 3. No evidence of metastatic adenopathy or visceral metastasis. 4. Intense of focal activity posterior to the RIGHT femoral neck. This localizes to small focus of extra osseous soft tissue. This lesion is hypermetabolic on comparison exam with no interval change. Favor lesion unrelated to lung carcinoma. Indeterminate activity. Consider MRI of the RIGHT hip for further evaluation.       REVIEW OF SYSTEMS:   Review of Systems - Oncology  All other pertinent systems were reviewed with the patient and are negative.  ALLERGIES: He has no known allergies.  MEDICATIONS:  Current Outpatient Medications  Medication Sig Dispense Refill   atorvastatin (LIPITOR) 10 MG tablet Take 10 mg by mouth at bedtime.     benzonatate (TESSALON) 200 MG capsule Take 200 mg by mouth 3 (three) times daily as needed for cough.     Calcium Carbonate-Vit D-Min (CALCIUM 1200 PO) Take 750 mg by mouth in the morning, at noon, and at bedtime. Patient is working towards increasing to 1200 mgs 5 times a day     Cholecalciferol (DIALYVITE VITAMIN D 5000) 125 MCG (5000 UT) capsule Take 1,500 Units by mouth every Saturday.     Cholecalciferol 125 MCG (5000 UT) capsule Take 5,000 Units by mouth daily.     clonazePAM  (KLONOPIN ) 1 MG tablet Take 1 mg by mouth at bedtime.     clopidogrel  (PLAVIX ) 75 MG tablet Take 75 mg by mouth daily.     diphenoxylate -atropine  (LOMOTIL ) 2.5-0.025 MG tablet Take 1 tablet by mouth 4 (four) times daily as needed for diarrhea or loose stools. 60 tablet 1   Ferrous Sulfate (IRON) 325 (65 FE) MG TABS Take 325 mg by mouth daily.     fluticasone (FLONASE) 50 MCG/ACT nasal spray Place 1 spray into both nostrils daily as needed for allergies.     furosemide  (LASIX ) 20 MG tablet Take 1 tablet (20 mg total) by mouth daily as needed for edema. 30 tablet 3   latanoprost (XALATAN) 0.005 % ophthalmic solution  Place 1 drop into both eyes at bedtime.     loratadine  (CLARITIN ) 10 MG tablet Take 10 mg by mouth at bedtime.     MAGNESIUM PO Take 1 tablet by mouth daily.     Multiple Vitamin (MULTIVITAMIN) tablet Take 1 tablet by mouth daily.     ondansetron  (ZOFRAN -ODT) 8 MG disintegrating tablet Take 1 tablet (8 mg total) by mouth every 8 (eight) hours as needed for nausea or vomiting. 60 tablet 3   PREVIDENT 5000 BOOSTER PLUS 1.1 % PSTE Take 1 Application by mouth once as needed (Dry mouth).     traZODone (DESYREL) 50 MG tablet Take 25 mg by mouth at bedtime.     lorlatinib  (LORBRENA ) 100 MG tablet Take 1 tablet (100 mg total) by mouth daily. Swallow tablets whole. Do not chew, crush or split tablets. (Patient not taking: Reported on 05/18/2023) 28 tablet 5   No current  facility-administered medications for this visit.     VITALS:   Blood pressure 138/81, pulse 76, temperature 98 F (36.7 C), temperature source Temporal, resp. rate 18, weight 204 lb 9.6 oz (92.8 kg), SpO2 99%.  Wt Readings from Last 3 Encounters:  05/18/23 204 lb 9.6 oz (92.8 kg)  04/21/23 204 lb 12.8 oz (92.9 kg)  03/24/23 198 lb 9.6 oz (90.1 kg)    Body mass index is 26.99 kg/m.   Onc Performance Status - 05/18/23 1543       ECOG Perf Status   ECOG Perf Status Restricted in physically strenuous activity but ambulatory and able to carry out work of a light or sedentary nature, e.g., light house work, office work                PHYSICAL EXAM:   Physical Exam Constitutional:      General: He is not in acute distress.    Appearance: Normal appearance.  HENT:     Head: Normocephalic and atraumatic.  Eyes:     General: No scleral icterus.    Conjunctiva/sclera: Conjunctivae normal.  Cardiovascular:     Rate and Rhythm: Normal rate and regular rhythm.     Heart sounds: Normal heart sounds.  Pulmonary:     Effort: Pulmonary effort is normal.     Breath sounds: Normal breath sounds.  Abdominal:     General:  There is no distension.  Musculoskeletal:     Right lower leg: Edema (1+) present.     Left lower leg: Edema (1+) present.  Lymphadenopathy:     Cervical: No cervical adenopathy.  Neurological:     General: No focal deficit present.     Mental Status: He is alert and oriented to person, place, and time.  Psychiatric:        Mood and Affect: Mood normal.        Behavior: Behavior normal.        Thought Content: Thought content normal.       LABORATORY DATA:   I have reviewed the data as listed.  Results for orders placed or performed in visit on 05/18/23  Magnesium  Result Value Ref Range   Magnesium 2.0 1.7 - 2.4 mg/dL  CBC with Differential/Platelet  Result Value Ref Range   WBC 3.8 (L) 4.0 - 10.5 K/uL   RBC 4.58 4.22 - 5.81 MIL/uL   Hemoglobin 13.9 13.0 - 17.0 g/dL   HCT 14.7 82.9 - 56.2 %   MCV 90.8 80.0 - 100.0 fL   MCH 30.3 26.0 - 34.0 pg   MCHC 33.4 30.0 - 36.0 g/dL   RDW 13.0 86.5 - 78.4 %   Platelets 155 150 - 400 K/uL   nRBC 0.0 0.0 - 0.2 %   Neutrophils Relative % 68 %   Neutro Abs 2.6 1.7 - 7.7 K/uL   Lymphocytes Relative 14 %   Lymphs Abs 0.5 (L) 0.7 - 4.0 K/uL   Monocytes Relative 13 %   Monocytes Absolute 0.5 0.1 - 1.0 K/uL   Eosinophils Relative 5 %   Eosinophils Absolute 0.2 0.0 - 0.5 K/uL   Basophils Relative 0 %   Basophils Absolute 0.0 0.0 - 0.1 K/uL   Immature Granulocytes 0 %   Abs Immature Granulocytes 0.01 0.00 - 0.07 K/uL  Comprehensive metabolic panel  Result Value Ref Range   Sodium 141 135 - 145 mmol/L   Potassium 4.8 3.5 - 5.1 mmol/L   Chloride 111 98 - 111 mmol/L  CO2 24 22 - 32 mmol/L   Glucose, Bld 113 (H) 70 - 99 mg/dL   BUN 22 8 - 23 mg/dL   Creatinine, Ser 1.61 0.61 - 1.24 mg/dL   Calcium 9.1 8.9 - 09.6 mg/dL   Total Protein 6.6 6.5 - 8.1 g/dL   Albumin 4.3 3.5 - 5.0 g/dL   AST 23 15 - 41 U/L   ALT 27 0 - 44 U/L   Alkaline Phosphatase 98 38 - 126 U/L   Total Bilirubin 0.4 0.0 - 1.2 mg/dL   GFR, Estimated >04 >54  mL/min   Anion gap 6 5 - 15      RADIOGRAPHIC STUDIES:  NM PET Image Restag (PS) Skull Base To Thigh CLINICAL DATA:  Subsequent treatment strategy for lung carcinoma with bone metastasis.  EXAM: NUCLEAR MEDICINE PET SKULL BASE TO THIGH  TECHNIQUE: 10.4 mCi F-18 FDG was injected intravenously. Full-ring PET imaging was performed from the skull base to thigh after the radiotracer. CT data was obtained and used for attenuation correction and anatomic localization.  Fasting blood glucose: 101 mg/dl  COMPARISON:  PET-CT 09/81/1914  FINDINGS: NECK: No hypermetabolic lymph nodes in the neck.  Incidental CT findings: None.  CHEST: Interval resolution of the previous seen hypermetabolic mildly hypermetabolic the template pulmonary nodule. No residual hypermetabolic nodules remain.  There is again demonstrated multiple calcified mediastinal lymph nodes unchanged from prior. No hypermetabolic mediastinal lymph nodes.  Incidental CT findings: None.  ABDOMEN/PELVIS: No abnormal hypermetabolic activity within the liver, pancreas, adrenal glands, or spleen. No hypermetabolic lymph nodes in the abdomen or pelvis.  Incidental CT findings: None.  SKELETON: Complete resolution of the hypermetabolic activity previously seen throughout the skeleton. There is underlying sclerotic lesions occupying every bone imaged. Again no hypermetabolic activity.  There is 1 focus of intense metabolic activity posterior to the RIGHT femoral neck. Small soft tissue nodule at the site. (image 205) there is a focus of metabolic activity on comparison exam at same site with equal intensity (SUV max equal 16)  Incidental CT findings: None.  IMPRESSION: 1. Resolution of hypermetabolic LEFT upper lobe pulmonary nodule. 2. Complete resolution of hypermetabolic activity within the skeleton. Underlying diffuse sclerotic skeletal disease remains consistent with treated metastasis. 3. No evidence of  metastatic adenopathy or visceral metastasis. 4. Intense of focal activity posterior to the RIGHT femoral neck. This localizes to small focus of extra osseous soft tissue. This lesion is hypermetabolic on comparison exam with no interval change. Favor lesion unrelated to lung carcinoma. Indeterminate activity. Consider MRI of the RIGHT hip for further evaluation.  Electronically Signed   By: Deboraha Fallow M.D.   On: 04/20/2023 12:54    CODE STATUS:  Code Status History     Date Active Date Inactive Code Status Order ID Comments User Context   07/20/2014 0933 07/21/2014 1200 Full Code [id number]  Agustina Aldrich, MD Inpatient      Advance Directive Documentation    Flowsheet Row Most Recent Value  Type of Advance Directive Healthcare Power of Attorney, Living will  Pre-existing out of facility DNR order (yellow form or pink MOST form) --  "MOST" Form in Place? --       Orders Placed This Encounter  Procedures   CBC with Differential (Cancer Center Only)    Standing Status:   Future    Expiration Date:   05/17/2024   CMP (Cancer Center only)    Standing Status:   Future    Expiration Date:   05/17/2024  Magnesium    Standing Status:   Future    Expiration Date:   05/17/2024   Lipid panel    Standing Status:   Future    Expiration Date:   05/17/2024     No future appointments.      This document was completed utilizing speech recognition software. Grammatical errors, random word insertions, pronoun errors, and incomplete sentences are an occasional consequence of this system due to software limitations, ambient noise, and hardware issues. Any formal questions or concerns about the content, text or information contained within the body of this dictation should be directly addressed to the provider for clarification.

## 2023-05-18 NOTE — Assessment & Plan Note (Signed)
 Bilateral lower extremity edema causing discomfort. Symmetrical and not associated with redness, ruling out thrombosis. Normal protein, albumin, kidney function, and calcium levels. Likely a side effect of Lorlatinib , known to cause swelling in about 50% of patients. Compression socks and conservative measures ineffective. Affecting quality of life. - Prescribe Lasix  (furosemide ) 20 mg once daily as needed for swelling. - Advise continued hydration and current conservative measures such as compression socks and elevation.

## 2023-05-18 NOTE — Patient Instructions (Signed)
 Denosumab Injection (Oncology) What is this medication? DENOSUMAB (den oh SUE mab) prevents weakened bones caused by cancer. It may also be used to treat noncancerous bone tumors that cannot be removed by surgery. It can also be used to treat high calcium levels in the blood caused by cancer. It works by blocking a protein that causes bones to break down quickly. This slows down the release of calcium from bones, which lowers calcium levels in your blood. It also makes your bones stronger and less likely to break (fracture). This medicine may be used for other purposes; ask your health care provider or pharmacist if you have questions. COMMON BRAND NAME(S): XGEVA What should I tell my care team before I take this medication? They need to know if you have any of these conditions: Dental disease Having surgery or tooth extraction Infection Kidney disease Low levels of calcium or vitamin D in the blood Malnutrition On hemodialysis Skin conditions or sensitivity Thyroid or parathyroid disease An unusual reaction to denosumab, other medications, foods, dyes, or preservatives Pregnant or trying to get pregnant Breast-feeding How should I use this medication? This medication is for injection under the skin. It is given by your care team in a hospital or clinic setting. A special MedGuide will be given to you before each treatment. Be sure to read this information carefully each time. Talk to your care team about the use of this medication in children. While it may be prescribed for children as young as 13 years for selected conditions, precautions do apply. Overdosage: If you think you have taken too much of this medicine contact a poison control center or emergency room at once. NOTE: This medicine is only for you. Do not share this medicine with others. What if I miss a dose? Keep appointments for follow-up doses. It is important not to miss your dose. Call your care team if you are unable to  keep an appointment. What may interact with this medication? Do not take this medication with any of the following: Other medications containing denosumab This medication may also interact with the following: Medications that lower your chance of fighting infection Steroid medications, such as prednisone or cortisone This list may not describe all possible interactions. Give your health care provider a list of all the medicines, herbs, non-prescription drugs, or dietary supplements you use. Also tell them if you smoke, drink alcohol, or use illegal drugs. Some items may interact with your medicine. What should I watch for while using this medication? Your condition will be monitored carefully while you are receiving this medication. You may need blood work while taking this medication. This medication may increase your risk of getting an infection. Call your care team for advice if you get a fever, chills, sore throat, or other symptoms of a cold or flu. Do not treat yourself. Try to avoid being around people who are sick. You should make sure you get enough calcium and vitamin D while you are taking this medication, unless your care team tells you not to. Discuss the foods you eat and the vitamins you take with your care team. Some people who take this medication have severe bone, joint, or muscle pain. This medication may also increase your risk for jaw problems or a broken thigh bone. Tell your care team right away if you have severe pain in your jaw, bones, joints, or muscles. Tell your care team if you have any pain that does not go away or that gets worse. Talk  to your care team if you may be pregnant. Serious birth defects can occur if you take this medication during pregnancy and for 5 months after the last dose. You will need a negative pregnancy test before starting this medication. Contraception is recommended while taking this medication and for 5 months after the last dose. Your care team  can help you find the option that works for you. What side effects may I notice from receiving this medication? Side effects that you should report to your care team as soon as possible: Allergic reactions--skin rash, itching, hives, swelling of the face, lips, tongue, or throat Bone, joint, or muscle pain Low calcium level--muscle pain or cramps, confusion, tingling, or numbness in the hands or feet Osteonecrosis of the jaw--pain, swelling, or redness in the mouth, numbness of the jaw, poor healing after dental work, unusual discharge from the mouth, visible bones in the mouth Side effects that usually do not require medical attention (report to your care team if they continue or are bothersome): Cough Diarrhea Fatigue Headache Nausea This list may not describe all possible side effects. Call your doctor for medical advice about side effects. You may report side effects to FDA at 1-800-FDA-1088. Where should I keep my medication? This medication is given in a hospital or clinic. It will not be stored at home. NOTE: This sheet is a summary. It may not cover all possible information. If you have questions about this medicine, talk to your doctor, pharmacist, or health care provider.  2024 Elsevier/Gold Standard (2021-06-05 00:00:00)

## 2023-05-19 ENCOUNTER — Ambulatory Visit: Admitting: Oncology

## 2023-05-19 ENCOUNTER — Other Ambulatory Visit

## 2023-05-21 ENCOUNTER — Other Ambulatory Visit: Payer: Self-pay

## 2023-05-24 ENCOUNTER — Other Ambulatory Visit: Payer: Self-pay

## 2023-05-28 ENCOUNTER — Other Ambulatory Visit (HOSPITAL_COMMUNITY): Payer: Self-pay

## 2023-05-30 ENCOUNTER — Other Ambulatory Visit: Payer: Self-pay

## 2023-06-02 ENCOUNTER — Other Ambulatory Visit (HOSPITAL_COMMUNITY): Payer: Self-pay

## 2023-06-02 ENCOUNTER — Other Ambulatory Visit: Payer: Self-pay

## 2023-06-02 NOTE — Progress Notes (Signed)
 Specialty Pharmacy Ongoing Clinical Assessment Note  Gregg Guerrero is a 73 y.o. male who is being followed by the specialty pharmacy service for RxSp Oncology   Patient's specialty medication(s) reviewed today: Lorlatinib  (LORBRENA )   Missed doses in the last 4 weeks: 0   Patient/Caregiver did not have any additional questions or concerns.   Therapeutic benefit summary: Patient is achieving benefit   Adverse events/side effects summary: Experienced adverse events/side effects (leg swelling, MD is monitoring and added Lasix )   Patient's therapy is appropriate to: Continue    Goals Addressed             This Visit's Progress    Maintain optimal adherence to therapy   On track    Patient is on track. Patient will maintain adherence         Follow up:  3 months  Malachi Screws Specialty Pharmacist

## 2023-06-02 NOTE — Progress Notes (Signed)
 Specialty Pharmacy Refill Coordination Note  Gregg Guerrero is a 73 y.o. male contacted today regarding refills of specialty medication(s) Lorlatinib  (LORBRENA )   Patient requested Delivery   Delivery date: 06/11/23   Verified address: [address]  [place]   Medication will be filled on 06/10/23.

## 2023-06-03 ENCOUNTER — Telehealth: Payer: Self-pay

## 2023-06-03 ENCOUNTER — Other Ambulatory Visit: Payer: Self-pay

## 2023-06-03 NOTE — Telephone Encounter (Signed)
 Returned patient's call yesterday at approximately 1650 to address concerns of increased bilateral feet swelling. Dr. Randye Buttner aware. Advised pt to elevate feet above heart whenever sitting and to apply TED Hose and equivalent per Dr. Randye Buttner or he may wrap with ACE wraps from toes to just below knees. Pt. Was placed of Lasix  per recent visit for this issue as well. Patient agreed to all recommendations.

## 2023-06-09 ENCOUNTER — Other Ambulatory Visit (HOSPITAL_BASED_OUTPATIENT_CLINIC_OR_DEPARTMENT_OTHER): Payer: Self-pay

## 2023-06-10 ENCOUNTER — Other Ambulatory Visit (HOSPITAL_BASED_OUTPATIENT_CLINIC_OR_DEPARTMENT_OTHER): Payer: Self-pay

## 2023-06-10 ENCOUNTER — Other Ambulatory Visit: Payer: Self-pay

## 2023-06-15 ENCOUNTER — Encounter: Payer: Self-pay | Admitting: Oncology

## 2023-06-15 ENCOUNTER — Inpatient Hospital Stay

## 2023-06-15 ENCOUNTER — Inpatient Hospital Stay: Attending: Oncology

## 2023-06-15 ENCOUNTER — Inpatient Hospital Stay: Admitting: Oncology

## 2023-06-15 VITALS — BP 118/69 | HR 76 | Temp 98.2°F | Resp 18 | Ht 73.0 in | Wt 209.3 lb

## 2023-06-15 DIAGNOSIS — C7951 Secondary malignant neoplasm of bone: Secondary | ICD-10-CM

## 2023-06-15 DIAGNOSIS — C3412 Malignant neoplasm of upper lobe, left bronchus or lung: Secondary | ICD-10-CM

## 2023-06-15 DIAGNOSIS — E785 Hyperlipidemia, unspecified: Secondary | ICD-10-CM | POA: Diagnosis not present

## 2023-06-15 DIAGNOSIS — R197 Diarrhea, unspecified: Secondary | ICD-10-CM

## 2023-06-15 DIAGNOSIS — R600 Localized edema: Secondary | ICD-10-CM | POA: Diagnosis not present

## 2023-06-15 LAB — LIPID PANEL
Cholesterol: 192 mg/dL (ref 0–200)
HDL: 28 mg/dL — ABNORMAL LOW (ref >40)
LDL Cholesterol: UNDETERMINED mg/dL (ref 0–99)
Total CHOL/HDL Ratio: 6.9 ratio
Triglycerides: 413 mg/dL — ABNORMAL HIGH (ref <150)
VLDL: UNDETERMINED mg/dL (ref 0–40)

## 2023-06-15 LAB — CMP (CANCER CENTER ONLY)
ALT: 28 U/L (ref 0–44)
AST: 27 U/L (ref 15–41)
Albumin: 4.1 g/dL (ref 3.5–5.0)
Alkaline Phosphatase: 105 U/L (ref 38–126)
Anion gap: 12 (ref 5–15)
BUN: 22 mg/dL (ref 8–23)
CO2: 22 mmol/L (ref 22–32)
Calcium: 8.7 mg/dL — ABNORMAL LOW (ref 8.9–10.3)
Chloride: 107 mmol/L (ref 98–111)
Creatinine: 1.02 mg/dL (ref 0.61–1.24)
GFR, Estimated: >60 mL/min (ref >60)
Glucose, Bld: 141 mg/dL — ABNORMAL HIGH (ref 70–99)
Potassium: 4.7 mmol/L (ref 3.5–5.1)
Sodium: 140 mmol/L (ref 135–145)
Total Bilirubin: 0.2 mg/dL (ref 0.0–1.2)
Total Protein: 6.7 g/dL (ref 6.5–8.1)

## 2023-06-15 LAB — CBC WITH DIFFERENTIAL (CANCER CENTER ONLY)
Abs Immature Granulocytes: 0.01 10*3/uL (ref 0.00–0.07)
Basophils Absolute: 0 10*3/uL (ref 0.0–0.1)
Basophils Relative: 0 %
Eosinophils Absolute: 0.2 10*3/uL (ref 0.0–0.5)
Eosinophils Relative: 5 %
HCT: 41.1 % (ref 39.0–52.0)
Hemoglobin: 13.6 g/dL (ref 13.0–17.0)
Immature Granulocytes: 0 %
Lymphocytes Relative: 15 %
Lymphs Abs: 0.7 10*3/uL (ref 0.7–4.0)
MCH: 30.2 pg (ref 26.0–34.0)
MCHC: 33.1 g/dL (ref 30.0–36.0)
MCV: 91.1 fL (ref 80.0–100.0)
Monocytes Absolute: 0.6 10*3/uL (ref 0.1–1.0)
Monocytes Relative: 12 %
Neutro Abs: 3.1 10*3/uL (ref 1.7–7.7)
Neutrophils Relative %: 68 %
Platelet Count: 146 10*3/uL — ABNORMAL LOW (ref 150–400)
RBC: 4.51 MIL/uL (ref 4.22–5.81)
RDW: 13.7 % (ref 11.5–15.5)
WBC Count: 4.6 10*3/uL (ref 4.0–10.5)
nRBC: 0 % (ref 0.0–0.2)

## 2023-06-15 LAB — LDL CHOLESTEROL, DIRECT: Direct LDL: 128 mg/dL — ABNORMAL HIGH (ref 0–99)

## 2023-06-15 LAB — MAGNESIUM: Magnesium: 2.2 mg/dL (ref 1.7–2.4)

## 2023-06-15 MED ORDER — DENOSUMAB 120 MG/1.7ML ~~LOC~~ SOLN
120.0000 mg | Freq: Once | SUBCUTANEOUS | Status: AC
  Administered 2023-06-15: 120 mg via SUBCUTANEOUS
  Filled 2023-06-15: qty 1.7

## 2023-06-15 MED ORDER — DIPHENOXYLATE-ATROPINE 2.5-0.025 MG PO TABS: 1.0000 | ORAL_TABLET | Freq: Four times a day (QID) | ORAL | 1 refills | Status: DC | PRN

## 2023-06-15 NOTE — Assessment & Plan Note (Signed)
 Previously elevated alkaline phosphatase likely due to bone disease. Discussed the use of Xgeva  to prevent fractures and strengthen bones. -Started Xgeva  injections from 01/27/23 and continue it monthly.  Dose given today. -Given persistent hypocalcemia previously, advised him to continue calcium supplements at 1800 mg daily dose.

## 2023-06-15 NOTE — Assessment & Plan Note (Addendum)
 Bilateral lower extremity edema causing discomfort. Symmetrical and not associated with redness, ruling out thrombosis. Normal protein, albumin, kidney function, and calcium levels. Likely a side effect of Lorlatinib , known to cause swelling in about 1-10% of patients. Compression socks and conservative measures ineffective. Affecting quality of life. - Prescribed Lasix  (furosemide ) 20 mg once daily as needed for swelling. - Advise leg elevation during rest - Encourage use of compression stockings or Ace wrap - Advised reducing sodium intake

## 2023-06-15 NOTE — Patient Instructions (Signed)
 Denosumab Injection (Oncology) What is this medication? DENOSUMAB (den oh SUE mab) prevents weakened bones caused by cancer. It may also be used to treat noncancerous bone tumors that cannot be removed by surgery. It can also be used to treat high calcium levels in the blood caused by cancer. It works by blocking a protein that causes bones to break down quickly. This slows down the release of calcium from bones, which lowers calcium levels in your blood. It also makes your bones stronger and less likely to break (fracture). This medicine may be used for other purposes; ask your health care provider or pharmacist if you have questions. COMMON BRAND NAME(S): XGEVA What should I tell my care team before I take this medication? They need to know if you have any of these conditions: Dental disease Having surgery or tooth extraction Infection Kidney disease Low levels of calcium or vitamin D in the blood Malnutrition On hemodialysis Skin conditions or sensitivity Thyroid or parathyroid disease An unusual reaction to denosumab, other medications, foods, dyes, or preservatives Pregnant or trying to get pregnant Breast-feeding How should I use this medication? This medication is for injection under the skin. It is given by your care team in a hospital or clinic setting. A special MedGuide will be given to you before each treatment. Be sure to read this information carefully each time. Talk to your care team about the use of this medication in children. While it may be prescribed for children as young as 13 years for selected conditions, precautions do apply. Overdosage: If you think you have taken too much of this medicine contact a poison control center or emergency room at once. NOTE: This medicine is only for you. Do not share this medicine with others. What if I miss a dose? Keep appointments for follow-up doses. It is important not to miss your dose. Call your care team if you are unable to  keep an appointment. What may interact with this medication? Do not take this medication with any of the following: Other medications containing denosumab This medication may also interact with the following: Medications that lower your chance of fighting infection Steroid medications, such as prednisone or cortisone This list may not describe all possible interactions. Give your health care provider a list of all the medicines, herbs, non-prescription drugs, or dietary supplements you use. Also tell them if you smoke, drink alcohol, or use illegal drugs. Some items may interact with your medicine. What should I watch for while using this medication? Your condition will be monitored carefully while you are receiving this medication. You may need blood work while taking this medication. This medication may increase your risk of getting an infection. Call your care team for advice if you get a fever, chills, sore throat, or other symptoms of a cold or flu. Do not treat yourself. Try to avoid being around people who are sick. You should make sure you get enough calcium and vitamin D while you are taking this medication, unless your care team tells you not to. Discuss the foods you eat and the vitamins you take with your care team. Some people who take this medication have severe bone, joint, or muscle pain. This medication may also increase your risk for jaw problems or a broken thigh bone. Tell your care team right away if you have severe pain in your jaw, bones, joints, or muscles. Tell your care team if you have any pain that does not go away or that gets worse. Talk  to your care team if you may be pregnant. Serious birth defects can occur if you take this medication during pregnancy and for 5 months after the last dose. You will need a negative pregnancy test before starting this medication. Contraception is recommended while taking this medication and for 5 months after the last dose. Your care team  can help you find the option that works for you. What side effects may I notice from receiving this medication? Side effects that you should report to your care team as soon as possible: Allergic reactions--skin rash, itching, hives, swelling of the face, lips, tongue, or throat Bone, joint, or muscle pain Low calcium level--muscle pain or cramps, confusion, tingling, or numbness in the hands or feet Osteonecrosis of the jaw--pain, swelling, or redness in the mouth, numbness of the jaw, poor healing after dental work, unusual discharge from the mouth, visible bones in the mouth Side effects that usually do not require medical attention (report to your care team if they continue or are bothersome): Cough Diarrhea Fatigue Headache Nausea This list may not describe all possible side effects. Call your doctor for medical advice about side effects. You may report side effects to FDA at 1-800-FDA-1088. Where should I keep my medication? This medication is given in a hospital or clinic. It will not be stored at home. NOTE: This sheet is a summary. It may not cover all possible information. If you have questions about this medicine, talk to your doctor, pharmacist, or health care provider.  2024 Elsevier/Gold Standard (2021-06-05 00:00:00)

## 2023-06-15 NOTE — Progress Notes (Signed)
 [facility]  ONCOLOGY CLINIC PROGRESS NOTE   Patient Care Team: Perley Bradley, MD as PCP - General (Family Medicine) Revankar, Micael Adas, MD as PCP - Cardiology (Cardiology) Boyce Byes, MD as PCP - Electrophysiology (Cardiology) Vergia Glasgow, MD as Consulting Physician (Pulmonary Disease) Rosalita Combe, RN as Oncology Nurse Navigator  PATIENT NAME: Gregg Guerrero   MR#: [id number] DOB: Nov 14, 1950  Date of visit: 06/15/2023  ASSESSMENT & PLAN:   Gregg Guerrero is a 73 y.o. 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 No evidence of liver or other visceral involvement. Patient has a history of secondhand smoke exposure. Family history of various cancers.   -Previously I discussed staging, diagnosis, prognosis, plan of care, treatment options with the patient and his wife.  Reviewed NCCN guidelines.  Discussed that all treatment options are palliative in nature and not curative intent, given stage IV disease.  They verbalized understanding.  - Ordered [facility] testing on the biopsy specimen to identify potential targetable mutations. 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  No other major actionable mutations.  -Given ALK mutation positivity, he would be candidate for targeted therapy.  Plan made to proceed with Lorlatinib  100 mg daily dosing, starting from 01/14/2023.  Side effects include but not limited to: increased blood pressure, edema, changes  in electrolytes, increase in cholesterol and triglycerides, changes in LFTs, peripheral neuropathy, CNS/mood changes, and rare but serious risk for AV block and ILD/pneumonitis.  EKG on 01/13/2023 showed normal sinus rhythm, normal QT interval of 372 ms.  This will be monitored periodically as needed.  -MRI of the brain performed on 01/01/2023 showed no definite evidence of intracranial metastatic disease noted.    -Patient has been compliant with Lorlatinib  and tolerating it very well.  Labs today reveal no dose-limiting toxicities.  Alkaline phosphatase decreased from 316 to 92, indicating a positive response to treatment. Calcium levels slightly down at 8.3.  -Given persistent hypocalcemia, I previously advised him to increase calcium supplements to total of 1800 mg daily.  -Restaging PET scan on 03/31/2023 showed resolution of hypermetabolic left upper lobe pulmonary nodule.  Complete resolution of hypermetabolic activity within the skeleton.  No evidence of metastatic lymphadenopathy or visceral metastatic disease.  This is consistent with excellent response.  I previously reviewed the images with the patient, his wife and his family member who were accompanying.  They were understandably very happy.  After his recent trip to [place], patient developed swelling in both legs around ankles.  As per our instructions, he held Lorlatinib  from 05/14/2023 and resumed it from 05/18/2023.    He was started on Lasix  20 mg daily as needed.  Labs today reveal no dose-limiting toxicities.  Plan is to continue Lorlatinib  100 mg daily.    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  This will be continued every 4 weeks.  Dose given today.  On the PET scan, there is 1 area of focal activity posterior to the right femoral neck, small focus.  This lesion is favored to be unrelated to his lung carcinoma.  We will monitor this on future scans.  Recent lipid panels showed stable cholesterol  levels overall.  If needed, we will increase atorvastatin dose.  RTC in 4 weeks for labs, follow-up and Xgeva  injection.  Next restaging PET scan will be obtained approximately 3 months from the last scan.  Request placed today.  Metastasis to bone [facility]) Previously elevated alkaline phosphatase likely due to bone disease. Discussed the use of Xgeva  to prevent fractures and strengthen bones. -Started Xgeva  injections from 01/27/23 and continue it monthly.  Dose given today. -Given persistent hypocalcemia previously, advised him to continue calcium supplements at 1800 mg daily dose.  Pedal edema Bilateral lower extremity edema causing discomfort. Symmetrical and not associated with redness, ruling out thrombosis. Normal protein, albumin, kidney function, and calcium levels. Likely a side effect of Lorlatinib , known to cause swelling in about 1-10% of patients. Compression socks and conservative measures ineffective. Affecting quality of life. - Prescribed Lasix  (furosemide ) 20 mg once daily as needed for swelling. - Advise leg elevation during rest - Encourage use of compression stockings or Ace wrap - Advised reducing sodium intake  I reviewed lab results and outside records for this visit and discussed relevant results with the patient. Diagnosis, plan of care and treatment options were also discussed in detail with the patient. Opportunity provided to ask questions and answers provided to his apparent satisfaction. Provided instructions to call our clinic with any problems, questions or concerns prior to return visit. I recommended to continue follow-up with PCP and sub-specialists. He verbalized understanding and agreed with the plan.   NCCN guidelines have been consulted in the planning of this patient's care.  I spent a total of 40 minutes during this encounter with the patient including review of chart and various tests results, discussions about plan of care and coordination of care  plan.   Arlo Berber, MD  06/15/2023 4:02 PM  [facility] - A DEPT OF [facility] [address] [name] [address] Dept: [phone] Dept Fax: [phone]    CHIEF COMPLAINT/ REASON FOR VISIT:   Adenocarcinoma of left lung with metastasis to the bones.  Stage IV disease.  ALK mutation positive.  Current Treatment: Lorlatinib  100 mg p.o. daily starting from 01/14/2023.  INTERVAL HISTORY:    Discussed the use of AI scribe software for clinical note transcription with the patient, who gave verbal consent to proceed.   Gregg Guerrero is here today for repeat clinical assessment.  He started taking Lorlatinib  from 01/14/2023.   History of Present Illness  He has been experiencing worsening swelling in his legs, which significantly affects his quality of life. The swelling is less pronounced in the mornings but worsens with activity throughout the day. He has attempted various interventions, including leg elevation, compression socks, and Lasix  once daily, but these have not provided significant relief. He describes a sensation of pressure in his toes, feeling as though they are 'squeezing together' and almost numb. No pain or redness in the legs, but he does feel pressure in his hand.  He also has a trigger finger in his little finger that locks up every morning, a condition that began last month. Despite previous discussions and attempts to manage it, the issue persists.  He is currently taking Lomotil  for diarrhea-related symptoms, consuming two to four tablets a day. He is concerned about running out of medication  soon due to an upcoming vacation. He experiences diarrhea intermittently and has not been using Imodium, an over-the-counter option.  His protein levels, which were slightly low in March, are now normal, with a current protein level of 6.7 and albumin at 4.1. His white blood count has improved  to 4,600, which is within the normal range.   I have reviewed the past medical history, past surgical history, social history and family history with the patient and they are unchanged from previous note.  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 Additionally there were scattered sclerotic bone lesions diffusely along the skeleton, and numerous calcified lymph nodes throughout the mediastinum and left hilum.    12/04/2022 PET scan   PET scan was notable for a 3.9 cm irregular spiculated mass in the left upper lobe, suspicious for bronchogenic carcinoma. 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  Whole body bone scan confirmed widespread osseous metastases. He denies any associated pain.    12/23/2022 Initial Diagnosis   Malignant neoplasm of upper lobe of left lung [facility]):  He has been under the care of his PCP for what was initially thought to be a pneumonia on CXR. Patient developed shortness of breath in September 2024 and was seen by his primary care physician.  At that time a chest x-ray was done which showed an infiltrate and question of atypical pneumonia.  The patient was treated with antibiotics however his symptoms did not improve.  A CT of the chest was ordered to further evaluate etiology  for nonresolving dyspnea.   CT chest on 11/11/2022 which showed a spiculated nodule in the lingula with diffuse interstitial thickening in the left upper lobe that was concerning for an aggressive process or malignancy.  This led to PET/CT, pulmonology referral and additional evaluation.  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 Per his wife the melanoma was low grade and did not require any further treatment after excision.    12/23/2022 Procedure   Patient had initial consultation in pulmonology clinic on 12/17/2022 and underwent navigational bronchoscopy, EBUS and biopsy of left upper lobe lung nodule on 12/23/2022, performed by Dr. Lucina Sabal.  On EBUS, hilar and mediastinal lymph node stations were examined but no lymph nodes were encountered.  This was suspected to be secondary to severe calcification noted on CT scan.   12/23/2022 Pathology Results   Pathology from left upper lobe lung nodule biopsy came back positive for adenocarcinoma.  Brushings from left upper lobe and lavage showed rare atypical cells.   12/30/2022 Cancer Staging   Staging form: Lung, AJCC 8th Edition - Clinical stage from 12/30/2022: Stage IVB (cT2a, cN0, cM1c) - Signed by Arlo Berber, MD on 12/30/2022 Stage prefix: Initial diagnosis    Miscellaneous  Request submitted for [facility] Medicine testing on the specimen and Guardant 360 (liquid biopsy) for any actionable mutations and also for PD-L1 testing.  Treatment decisions to be made pending these results.   01/05/2023 Pathology Results   Foundation One CDx: ALK + EML4-ALK fusion (Variant 1)  PD-L1 TPS 5%  DNMT3A P904L mutation noted (unknown significance in his tumor type).    01/06/2023 -  Chemotherapy   Patient is on Treatment Plan : LUNG NSCLC Lorlatinib  q28d     03/31/2023 PET scan   IMPRESSION: 1. Resolution of hypermetabolic LEFT upper lobe pulmonary nodule. 2.  Complete resolution of hypermetabolic activity within the skeleton. Underlying diffuse sclerotic skeletal disease remains consistent with treated metastasis. 3. No evidence of metastatic adenopathy or visceral metastasis. 4. Intense of focal activity posterior to the RIGHT femoral neck. This localizes to small focus of extra osseous soft tissue. This lesion is hypermetabolic on comparison exam with no interval change. Favor lesion unrelated to lung carcinoma. Indeterminate activity. Consider MRI of the RIGHT hip for further evaluation.       REVIEW OF SYSTEMS:   Review of Systems - Oncology  All other pertinent systems were reviewed with the patient and are negative.  ALLERGIES: He has no known allergies.  MEDICATIONS:  Current Outpatient Medications  Medication Sig Dispense Refill   atorvastatin (LIPITOR) 10 MG tablet Take 10 mg by mouth at bedtime.     benzonatate (TESSALON) 200 MG capsule Take 200 mg by mouth 3 (three) times daily as needed for cough.     Calcium Carbonate-Vit D-Min (CALCIUM 1200 PO) Take 750 mg by mouth in the morning, at noon, and at bedtime. Patient is working towards increasing to 1200 mgs 5 times a day     Cholecalciferol 125 MCG (5000 UT) capsule Take 5,000 Units by mouth daily.     clonazePAM  (KLONOPIN ) 1 MG tablet Take 1 mg by mouth at bedtime.     clopidogrel  (PLAVIX ) 75 MG tablet Take 75 mg by mouth daily.     Ferrous Sulfate (IRON) 325 (65 FE) MG TABS Take 325 mg by mouth daily.     fluticasone (FLONASE) 50 MCG/ACT nasal spray Place 1 spray into both nostrils daily as needed for allergies.     furosemide  (LASIX ) 20 MG tablet Take 1 tablet (20 mg total) by mouth daily as needed for edema. 30 tablet 3   latanoprost (XALATAN) 0.005 % ophthalmic solution Place 1 drop into both eyes at bedtime.     loratadine  (CLARITIN ) 10 MG tablet Take 10 mg by mouth at bedtime.     lorlatinib  (LORBRENA ) 100 MG tablet Take 1 tablet (100 mg total) by mouth daily. Swallow  tablets whole. Do not chew, crush or split tablets. 28 tablet 5   MAGNESIUM PO Take 1 tablet by mouth daily.     Multiple Vitamin (MULTIVITAMIN) tablet Take 1 tablet by mouth daily.     ondansetron  (ZOFRAN -ODT) 8 MG disintegrating tablet Take 1 tablet (8 mg total) by mouth every 8 (eight) hours as needed for nausea or vomiting. 60 tablet 3   PREVIDENT 5000 BOOSTER PLUS 1.1 % PSTE Take 1 Application by mouth once as needed (Dry mouth).     traZODone (DESYREL) 50 MG tablet Take 25 mg by mouth at bedtime.     diphenoxylate -atropine  (LOMOTIL ) 2.5-0.025 MG tablet Take 1 tablet by mouth 4 (four) times daily as needed for diarrhea or loose stools. 90 tablet 1   No current facility-administered medications for this visit.  VITALS:   Blood pressure 118/69, pulse 76, temperature 98.2 F (36.8 C), temperature source Temporal, resp. rate 18, height 6\' 1"  (1.854 m), weight 209 lb 4.8 oz (94.9 kg), SpO2 96%.  Wt Readings from Last 3 Encounters:  06/15/23 209 lb 4.8 oz (94.9 kg)  05/18/23 204 lb 9.6 oz (92.8 kg)  04/21/23 204 lb 12.8 oz (92.9 kg)    Body mass index is 27.61 kg/m.   Onc Performance Status - 06/15/23 1408       ECOG Perf Status   ECOG Perf Status Restricted in physically strenuous activity but ambulatory and able to carry out work of a light or sedentary nature, e.g., light house work, office work      KPS SCALE   KPS % SCORE Normal, no compliants, no evidence of disease             PHYSICAL EXAM:   Physical Exam Constitutional:      General: He is not in acute distress.    Appearance: Normal appearance.  HENT:     Head: Normocephalic and atraumatic.  Eyes:     General: No scleral icterus.    Conjunctiva/sclera: Conjunctivae normal.  Cardiovascular:     Rate and Rhythm: Normal rate and regular rhythm.     Heart sounds: Normal heart sounds.  Pulmonary:     Effort: Pulmonary effort is normal.     Breath sounds: Normal breath sounds.  Abdominal:     General:  There is no distension.  Musculoskeletal:     Right lower leg: Edema (1+) present.     Left lower leg: Edema (1+) present.  Lymphadenopathy:     Cervical: No cervical adenopathy.  Neurological:     General: No focal deficit present.     Mental Status: He is alert and oriented to person, place, and time.  Psychiatric:        Mood and Affect: Mood normal.        Behavior: Behavior normal.        Thought Content: Thought content normal.       LABORATORY DATA:   I have reviewed the data as listed.  Results for orders placed or performed in visit on 06/15/23  Magnesium  Result Value Ref Range   Magnesium 2.2 1.7 - 2.4 mg/dL  CMP (Cancer Center only)  Result Value Ref Range   Sodium 140 135 - 145 mmol/L   Potassium 4.7 3.5 - 5.1 mmol/L   Chloride 107 98 - 111 mmol/L   CO2 22 22 - 32 mmol/L   Glucose, Bld 141 (H) 70 - 99 mg/dL   BUN 22 8 - 23 mg/dL   Creatinine 1.61 0.96 - 1.24 mg/dL   Calcium 8.7 (L) 8.9 - 10.3 mg/dL   Total Protein 6.7 6.5 - 8.1 g/dL   Albumin 4.1 3.5 - 5.0 g/dL   AST 27 15 - 41 U/L   ALT 28 0 - 44 U/L   Alkaline Phosphatase 105 38 - 126 U/L   Total Bilirubin 0.2 0.0 - 1.2 mg/dL   GFR, Estimated >04 >54 mL/min   Anion gap 12 5 - 15  CBC with Differential (Cancer Center Only)  Result Value Ref Range   WBC Count 4.6 4.0 - 10.5 K/uL   RBC 4.51 4.22 - 5.81 MIL/uL   Hemoglobin 13.6 13.0 - 17.0 g/dL   HCT 09.8 11.9 - 14.7 %   MCV 91.1 80.0 - 100.0 fL   MCH 30.2 26.0 - 34.0 pg  MCHC 33.1 30.0 - 36.0 g/dL   RDW 03.4 74.2 - 59.5 %   Platelet Count 146 (L) 150 - 400 K/uL   nRBC 0.0 0.0 - 0.2 %   Neutrophils Relative % 68 %   Neutro Abs 3.1 1.7 - 7.7 K/uL   Lymphocytes Relative 15 %   Lymphs Abs 0.7 0.7 - 4.0 K/uL   Monocytes Relative 12 %   Monocytes Absolute 0.6 0.1 - 1.0 K/uL   Eosinophils Relative 5 %   Eosinophils Absolute 0.2 0.0 - 0.5 K/uL   Basophils Relative 0 %   Basophils Absolute 0.0 0.0 - 0.1 K/uL   Immature Granulocytes 0 %   Abs  Immature Granulocytes 0.01 0.00 - 0.07 K/uL      RADIOGRAPHIC STUDIES:  NM PET Image Restag (PS) Skull Base To Thigh CLINICAL DATA:  Subsequent treatment strategy for lung carcinoma with bone metastasis.  EXAM: NUCLEAR MEDICINE PET SKULL BASE TO THIGH  TECHNIQUE: 10.4 mCi F-18 FDG was injected intravenously. Full-ring PET imaging was performed from the skull base to thigh after the radiotracer. CT data was obtained and used for attenuation correction and anatomic localization.  Fasting blood glucose: 101 mg/dl  COMPARISON:  PET-CT 63/87/5643  FINDINGS: NECK: No hypermetabolic lymph nodes in the neck.  Incidental CT findings: None.  CHEST: Interval resolution of the previous seen hypermetabolic mildly hypermetabolic the template pulmonary nodule. No residual hypermetabolic nodules remain.  There is again demonstrated multiple calcified mediastinal lymph nodes unchanged from prior. No hypermetabolic mediastinal lymph nodes.  Incidental CT findings: None.  ABDOMEN/PELVIS: No abnormal hypermetabolic activity within the liver, pancreas, adrenal glands, or spleen. No hypermetabolic lymph nodes in the abdomen or pelvis.  Incidental CT findings: None.  SKELETON: Complete resolution of the hypermetabolic activity previously seen throughout the skeleton. There is underlying sclerotic lesions occupying every bone imaged. Again no hypermetabolic activity.  There is 1 focus of intense metabolic activity posterior to the RIGHT femoral neck. Small soft tissue nodule at the site. (image 205) there is a focus of metabolic activity on comparison exam at same site with equal intensity (SUV max equal 16)  Incidental CT findings: None.  IMPRESSION: 1. Resolution of hypermetabolic LEFT upper lobe pulmonary nodule. 2. Complete resolution of hypermetabolic activity within the skeleton. Underlying diffuse sclerotic skeletal disease remains consistent with treated metastasis. 3.  No evidence of metastatic adenopathy or visceral metastasis. 4. Intense of focal activity posterior to the RIGHT femoral neck. This localizes to small focus of extra osseous soft tissue. This lesion is hypermetabolic on comparison exam with no interval change. Favor lesion unrelated to lung carcinoma. Indeterminate activity. Consider MRI of the RIGHT hip for further evaluation.  Electronically Signed   By: Deboraha Fallow M.D.   On: 04/20/2023 12:54    CODE STATUS:  Code Status History     Date Active Date Inactive Code Status Order ID Comments User Context   07/20/2014 0933 07/21/2014 1200 Full Code [id number]  Agustina Aldrich, MD Inpatient      Advance Directive Documentation    Flowsheet Row Most Recent Value  Type of Advance Directive Healthcare Power of Attorney, Living will  Pre-existing out of facility DNR order (yellow form or pink MOST form) --  "MOST" Form in Place? --       Orders Placed This Encounter  Procedures   NM PET Image Restag (PS) Skull Base To Thigh    Standing Status:   Future    Expected Date:   07/03/2023  Expiration Date:   06/14/2024    If indicated for the ordered procedure, I authorize the administration of a radiopharmaceutical per Radiology protocol:   Yes    Preferred imaging location?:   [facility]     This document was completed utilizing speech recognition software. Grammatical errors, random word insertions, pronoun errors, and incomplete sentences are an occasional consequence of this system due to software limitations, ambient noise, and hardware issues. Any formal questions or concerns about the content, text or information contained within the body of this dictation should be directly addressed to the provider for clarification.

## 2023-06-15 NOTE — Assessment & Plan Note (Addendum)
 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 No evidence of liver or other visceral involvement. Patient has a history of secondhand smoke exposure. Family history of various cancers.   -Previously I discussed staging, diagnosis, prognosis, plan of care, treatment options with the patient and his wife.  Reviewed NCCN guidelines.  Discussed that all treatment options are palliative in nature and not curative intent, given stage IV disease.  They verbalized understanding.  - Ordered [facility] testing on the biopsy specimen to identify potential targetable mutations. 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  No other major actionable mutations.  -Given ALK mutation positivity, he would be candidate for targeted therapy.  Plan made to proceed with Lorlatinib  100 mg daily dosing, starting from 01/14/2023.  Side effects include but not limited to: increased blood pressure, edema, changes in electrolytes, increase in cholesterol and triglycerides, changes in LFTs, peripheral neuropathy, CNS/mood changes, and rare but serious risk for AV block and ILD/pneumonitis.  EKG on 01/13/2023 showed normal sinus rhythm, normal QT interval of 372 ms.  This will be monitored periodically as needed.  -MRI of the brain performed on 01/01/2023 showed no definite evidence of intracranial metastatic disease noted.    -Patient has been compliant with Lorlatinib  and tolerating it very well.  Labs today reveal no dose-limiting toxicities.  Alkaline phosphatase decreased from 316 to 92, indicating a positive response to treatment. Calcium levels slightly down at 8.3.  -Given persistent hypocalcemia, I previously advised him to increase calcium supplements to total of 1800 mg daily.  -Restaging PET scan on 03/31/2023 showed resolution of hypermetabolic left upper lobe  pulmonary nodule.  Complete resolution of hypermetabolic activity within the skeleton.  No evidence of metastatic lymphadenopathy or visceral metastatic disease.  This is consistent with excellent response.  I previously reviewed the images with the patient, his wife and his family member who were accompanying.  They were understandably very happy.  After his recent trip to [place], patient developed swelling in both legs around ankles.  As per our instructions, he held Lorlatinib  from 05/14/2023 and resumed it from 05/18/2023.    He was started on Lasix  20 mg daily as needed.  Labs today reveal no dose-limiting toxicities.  Plan is to continue Lorlatinib  100 mg daily.    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  This will be continued every 4 weeks.  Dose given today.  On the PET scan, there is 1 area of focal activity posterior to the right femoral neck, small focus.  This lesion is favored to be unrelated to his lung carcinoma.  We will monitor this on future scans.  Recent lipid panels showed stable cholesterol levels overall.  If needed, we will increase atorvastatin dose.  RTC in 4 weeks for labs, follow-up and Xgeva  injection.  Next restaging PET scan will be obtained approximately 3 months from the last scan.  Request placed today.

## 2023-06-16 ENCOUNTER — Other Ambulatory Visit: Payer: Self-pay | Admitting: Oncology

## 2023-06-16 ENCOUNTER — Ambulatory Visit: Payer: Self-pay | Admitting: Oncology

## 2023-06-16 ENCOUNTER — Telehealth: Payer: Self-pay | Admitting: Oncology

## 2023-06-16 DIAGNOSIS — C3412 Malignant neoplasm of upper lobe, left bronchus or lung: Secondary | ICD-10-CM

## 2023-06-16 MED ORDER — ATORVASTATIN CALCIUM 20 MG PO TABS: 20.0000 mg | ORAL_TABLET | Freq: Every evening | ORAL | 3 refills | Status: DC

## 2023-06-16 NOTE — Telephone Encounter (Signed)
 Patient has been scheduled for follow-up visit per 06/15/23 LOS.  Pt noted appt details on personal planner/calendar.

## 2023-06-17 ENCOUNTER — Other Ambulatory Visit: Payer: Self-pay

## 2023-06-30 ENCOUNTER — Other Ambulatory Visit: Payer: Self-pay

## 2023-07-03 ENCOUNTER — Other Ambulatory Visit: Payer: Self-pay

## 2023-07-03 ENCOUNTER — Encounter (HOSPITAL_COMMUNITY)
Admission: RE | Admit: 2023-07-03 | Discharge: 2023-07-03 | Disposition: A | Discharge status: Home / Self Care | Source: Ambulatory Visit | Attending: Oncology | Admitting: Oncology

## 2023-07-03 ENCOUNTER — Other Ambulatory Visit (HOSPITAL_BASED_OUTPATIENT_CLINIC_OR_DEPARTMENT_OTHER): Payer: Self-pay

## 2023-07-03 DIAGNOSIS — C3412 Malignant neoplasm of upper lobe, left bronchus or lung: Secondary | ICD-10-CM | POA: Diagnosis present

## 2023-07-03 LAB — GLUCOSE, CAPILLARY: Glucose-Capillary: 128 mg/dL — ABNORMAL HIGH (ref 70–99)

## 2023-07-03 MED ORDER — FLUDEOXYGLUCOSE F - 18 (FDG) INJECTION
10.4500 | Freq: Once | INTRAVENOUS | Status: AC
  Administered 2023-07-03: 10.44 via INTRAVENOUS

## 2023-07-08 ENCOUNTER — Other Ambulatory Visit: Payer: Self-pay

## 2023-07-09 ENCOUNTER — Other Ambulatory Visit: Payer: Self-pay

## 2023-07-09 NOTE — Progress Notes (Signed)
 Specialty Pharmacy Refill Coordination Note  Gregg Guerrero is a 73 y.o. male contacted today regarding refills of specialty medication(s) Lorlatinib  (LORBRENA )   Patient requested Delivery   Delivery date: 07/14/23   Verified address: [address]  [place]   Medication will be filled on 07/13/23.

## 2023-07-13 ENCOUNTER — Other Ambulatory Visit: Payer: Self-pay

## 2023-07-13 ENCOUNTER — Encounter: Payer: Self-pay | Admitting: Oncology

## 2023-07-13 ENCOUNTER — Inpatient Hospital Stay: Admitting: Oncology

## 2023-07-13 ENCOUNTER — Inpatient Hospital Stay

## 2023-07-13 ENCOUNTER — Inpatient Hospital Stay: Attending: Oncology

## 2023-07-13 VITALS — BP 138/84 | HR 67 | Temp 98.2°F | Resp 18 | Ht 73.0 in | Wt 214.6 lb

## 2023-07-13 DIAGNOSIS — E782 Mixed hyperlipidemia: Secondary | ICD-10-CM | POA: Insufficient documentation

## 2023-07-13 DIAGNOSIS — Z7722 Contact with and (suspected) exposure to environmental tobacco smoke (acute) (chronic): Secondary | ICD-10-CM | POA: Diagnosis not present

## 2023-07-13 DIAGNOSIS — R600 Localized edema: Secondary | ICD-10-CM

## 2023-07-13 DIAGNOSIS — C3412 Malignant neoplasm of upper lobe, left bronchus or lung: Secondary | ICD-10-CM

## 2023-07-13 DIAGNOSIS — R748 Abnormal levels of other serum enzymes: Secondary | ICD-10-CM | POA: Insufficient documentation

## 2023-07-13 DIAGNOSIS — C7951 Secondary malignant neoplasm of bone: Secondary | ICD-10-CM

## 2023-07-13 DIAGNOSIS — R2241 Localized swelling, mass and lump, right lower limb: Secondary | ICD-10-CM

## 2023-07-13 LAB — CMP (CANCER CENTER ONLY)
ALT: 29 U/L (ref 0–44)
AST: 26 U/L (ref 15–41)
Albumin: 4.2 g/dL (ref 3.5–5.0)
Alkaline Phosphatase: 105 U/L (ref 38–126)
Anion gap: 14 (ref 5–15)
BUN: 26 mg/dL — ABNORMAL HIGH (ref 8–23)
CO2: 20 mmol/L — ABNORMAL LOW (ref 22–32)
Calcium: 9.5 mg/dL (ref 8.9–10.3)
Chloride: 106 mmol/L (ref 98–111)
Creatinine: 0.94 mg/dL (ref 0.61–1.24)
GFR, Estimated: >60 mL/min (ref >60)
Glucose, Bld: 142 mg/dL — ABNORMAL HIGH (ref 70–99)
Potassium: 4.6 mmol/L (ref 3.5–5.1)
Sodium: 140 mmol/L (ref 135–145)
Total Bilirubin: 0.2 mg/dL (ref 0.0–1.2)
Total Protein: 6.8 g/dL (ref 6.5–8.1)

## 2023-07-13 LAB — LIPID PANEL
Cholesterol: 173 mg/dL (ref 0–200)
HDL: 32 mg/dL — ABNORMAL LOW (ref >40)
LDL Cholesterol: UNDETERMINED mg/dL (ref 0–99)
Total CHOL/HDL Ratio: 5.4 ratio
Triglycerides: 429 mg/dL — ABNORMAL HIGH (ref <150)
VLDL: UNDETERMINED mg/dL (ref 0–40)

## 2023-07-13 LAB — CBC WITH DIFFERENTIAL (CANCER CENTER ONLY)
Abs Immature Granulocytes: 0.03 10*3/uL (ref 0.00–0.07)
Basophils Absolute: 0 10*3/uL (ref 0.0–0.1)
Basophils Relative: 0 %
Eosinophils Absolute: 0.2 10*3/uL (ref 0.0–0.5)
Eosinophils Relative: 4 %
HCT: 42.3 % (ref 39.0–52.0)
Hemoglobin: 14.1 g/dL (ref 13.0–17.0)
Immature Granulocytes: 1 %
Lymphocytes Relative: 14 %
Lymphs Abs: 0.7 10*3/uL (ref 0.7–4.0)
MCH: 29.6 pg (ref 26.0–34.0)
MCHC: 33.3 g/dL (ref 30.0–36.0)
MCV: 88.9 fL (ref 80.0–100.0)
Monocytes Absolute: 0.6 10*3/uL (ref 0.1–1.0)
Monocytes Relative: 12 %
Neutro Abs: 3.5 10*3/uL (ref 1.7–7.7)
Neutrophils Relative %: 69 %
Platelet Count: 146 10*3/uL — ABNORMAL LOW (ref 150–400)
RBC: 4.76 MIL/uL (ref 4.22–5.81)
RDW: 13.8 % (ref 11.5–15.5)
WBC Count: 5 10*3/uL (ref 4.0–10.5)
nRBC: 0 % (ref 0.0–0.2)

## 2023-07-13 LAB — LDL CHOLESTEROL, DIRECT: Direct LDL: 111 mg/dL — ABNORMAL HIGH (ref 0–99)

## 2023-07-13 LAB — MAGNESIUM: Magnesium: 2.2 mg/dL (ref 1.7–2.4)

## 2023-07-13 MED ORDER — DENOSUMAB 120 MG/1.7ML ~~LOC~~ SOLN
120.0000 mg | Freq: Once | SUBCUTANEOUS | Status: AC
  Administered 2023-07-13: 120 mg via SUBCUTANEOUS
  Filled 2023-07-13: qty 1.7

## 2023-07-13 NOTE — Assessment & Plan Note (Signed)
 Previously elevated alkaline phosphatase likely due to bone disease. Discussed the use of Xgeva  to prevent fractures and strengthen bones. -Started Xgeva  injections from 01/27/23 and continue it monthly.  Dose given today. -Given persistent hypocalcemia previously, advised him to continue calcium supplements at 1800 mg daily dose.

## 2023-07-13 NOTE — Assessment & Plan Note (Addendum)
 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 No evidence of liver or other visceral involvement. Patient has a history of secondhand smoke exposure. Family history of various cancers.   -Previously I discussed staging, diagnosis, prognosis, plan of care, treatment options with the patient and his wife.  Reviewed NCCN guidelines.  Discussed that all treatment options are palliative in nature and not curative intent, given stage IV disease.  They verbalized understanding.  - Ordered [facility] Medicine testing on the biopsy specimen to identify potential targetable mutations. 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  No other major actionable mutations.  -Given ALK mutation positivity, he would be candidate for targeted therapy.  Plan made to proceed with Lorlatinib  100 mg daily dosing, starting from 01/14/2023.  Side effects include but not limited to: increased blood pressure, edema, changes in electrolytes, increase in cholesterol and triglycerides, changes in LFTs, peripheral neuropathy, CNS/mood changes, and rare but serious risk for AV block and ILD/pneumonitis.  EKG on 01/13/2023 showed normal sinus rhythm, normal QT interval of 372 ms.  This will be monitored periodically as needed.  -MRI of the brain performed on 01/01/2023 showed no definite evidence of intracranial metastatic disease noted.    -Patient has been compliant with Lorlatinib  and tolerating it very well.  Labs today reveal no dose-limiting toxicities.  Alkaline phosphatase decreased from 316 to 92, indicating a positive response to treatment. Calcium  levels slightly down at 8.3.  -Given persistent hypocalcemia, I previously advised him to increase calcium  supplements to total of 1800 mg daily.  -Restaging PET scan on 03/31/2023 showed resolution of hypermetabolic left upper lobe  pulmonary nodule.  Complete resolution of hypermetabolic activity within the skeleton.  No evidence of metastatic lymphadenopathy or visceral metastatic disease.  This is consistent with excellent response.  I previously reviewed the images with the patient, his wife and his family member who were accompanying.  They were understandably very happy.  After his recent trip to [place], patient developed swelling in both legs around ankles.  As per our instructions, he held Lorlatinib  from 05/14/2023 and resumed it from 05/18/2023.    He was started on Lasix  20 mg daily as needed.  Recent restaging PET scan on 07/03/2023 continue to show no evidence of FDG avid disease.  It did show soft tissue lesion behind the right hip, stable with mild FDG activity, likely unrelated to his lung cancer diagnosis.  Will obtain MRI for further evaluation.  Will also obtain MRI of the brain for restaging.  Labs today reveal no dose-limiting toxicities.  Plan is to continue Lorlatinib  100 mg daily.    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  This will be continued every 4 weeks.  Dose given today.  Recently atorvastatin  dose was increased to 20 mg, as triglycerides were elevated from Lorlatinib  use.  RTC in 4 weeks for labs, follow-up and Xgeva  injection.  Next restaging scan will be obtained approximately 3 months from the last scan and we will obtain CT scans going forward.

## 2023-07-13 NOTE — Patient Instructions (Signed)
 Denosumab Injection (Oncology) What is this medication? DENOSUMAB (den oh SUE mab) prevents weakened bones caused by cancer. It may also be used to treat noncancerous bone tumors that cannot be removed by surgery. It can also be used to treat high calcium levels in the blood caused by cancer. It works by blocking a protein that causes bones to break down quickly. This slows down the release of calcium from bones, which lowers calcium levels in your blood. It also makes your bones stronger and less likely to break (fracture). This medicine may be used for other purposes; ask your health care provider or pharmacist if you have questions. COMMON BRAND NAME(S): XGEVA What should I tell my care team before I take this medication? They need to know if you have any of these conditions: Dental disease Having surgery or tooth extraction Infection Kidney disease Low levels of calcium or vitamin D in the blood Malnutrition On hemodialysis Skin conditions or sensitivity Thyroid or parathyroid disease An unusual reaction to denosumab, other medications, foods, dyes, or preservatives Pregnant or trying to get pregnant Breast-feeding How should I use this medication? This medication is for injection under the skin. It is given by your care team in a hospital or clinic setting. A special MedGuide will be given to you before each treatment. Be sure to read this information carefully each time. Talk to your care team about the use of this medication in children. While it may be prescribed for children as young as 13 years for selected conditions, precautions do apply. Overdosage: If you think you have taken too much of this medicine contact a poison control center or emergency room at once. NOTE: This medicine is only for you. Do not share this medicine with others. What if I miss a dose? Keep appointments for follow-up doses. It is important not to miss your dose. Call your care team if you are unable to  keep an appointment. What may interact with this medication? Do not take this medication with any of the following: Other medications containing denosumab This medication may also interact with the following: Medications that lower your chance of fighting infection Steroid medications, such as prednisone or cortisone This list may not describe all possible interactions. Give your health care provider a list of all the medicines, herbs, non-prescription drugs, or dietary supplements you use. Also tell them if you smoke, drink alcohol, or use illegal drugs. Some items may interact with your medicine. What should I watch for while using this medication? Your condition will be monitored carefully while you are receiving this medication. You may need blood work while taking this medication. This medication may increase your risk of getting an infection. Call your care team for advice if you get a fever, chills, sore throat, or other symptoms of a cold or flu. Do not treat yourself. Try to avoid being around people who are sick. You should make sure you get enough calcium and vitamin D while you are taking this medication, unless your care team tells you not to. Discuss the foods you eat and the vitamins you take with your care team. Some people who take this medication have severe bone, joint, or muscle pain. This medication may also increase your risk for jaw problems or a broken thigh bone. Tell your care team right away if you have severe pain in your jaw, bones, joints, or muscles. Tell your care team if you have any pain that does not go away or that gets worse. Talk  to your care team if you may be pregnant. Serious birth defects can occur if you take this medication during pregnancy and for 5 months after the last dose. You will need a negative pregnancy test before starting this medication. Contraception is recommended while taking this medication and for 5 months after the last dose. Your care team  can help you find the option that works for you. What side effects may I notice from receiving this medication? Side effects that you should report to your care team as soon as possible: Allergic reactions--skin rash, itching, hives, swelling of the face, lips, tongue, or throat Bone, joint, or muscle pain Low calcium level--muscle pain or cramps, confusion, tingling, or numbness in the hands or feet Osteonecrosis of the jaw--pain, swelling, or redness in the mouth, numbness of the jaw, poor healing after dental work, unusual discharge from the mouth, visible bones in the mouth Side effects that usually do not require medical attention (report to your care team if they continue or are bothersome): Cough Diarrhea Fatigue Headache Nausea This list may not describe all possible side effects. Call your doctor for medical advice about side effects. You may report side effects to FDA at 1-800-FDA-1088. Where should I keep my medication? This medication is given in a hospital or clinic. It will not be stored at home. NOTE: This sheet is a summary. It may not cover all possible information. If you have questions about this medicine, talk to your doctor, pharmacist, or health care provider.  2024 Elsevier/Gold Standard (2021-06-05 00:00:00)

## 2023-07-13 NOTE — Progress Notes (Signed)
 [facility]  ONCOLOGY CLINIC PROGRESS NOTE   Patient Care Team: Perley Bradley, MD as PCP - General (Family Medicine) Revankar, Micael Adas, MD as PCP - Cardiology (Cardiology) Boyce Byes, MD as PCP - Electrophysiology (Cardiology) Vergia Glasgow, MD as Consulting Physician (Pulmonary Disease) Rosalita Combe, RN as Oncology Nurse Navigator  PATIENT NAME: Gregg Guerrero   MR#: [id number] DOB: 1950-09-21  Date of visit: 07/13/2023  ASSESSMENT & PLAN:   Gregg Guerrero is a 73 y.o. 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 No evidence of liver or other visceral involvement. Patient has a history of secondhand smoke exposure. Family history of various cancers.   -Previously I discussed staging, diagnosis, prognosis, plan of care, treatment options with the patient and his wife.  Reviewed NCCN guidelines.  Discussed that all treatment options are palliative in nature and not curative intent, given stage IV disease.  They verbalized understanding.  - Ordered Foundation Medicine testing on the biopsy specimen to identify potential targetable mutations. 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  No other major actionable mutations.  -Given ALK mutation positivity, he would be candidate for targeted therapy.  Plan made to proceed with Lorlatinib  100 mg daily dosing, starting from 01/14/2023.  Side effects include but not limited to: increased blood pressure, edema, changes  in electrolytes, increase in cholesterol and triglycerides, changes in LFTs, peripheral neuropathy, CNS/mood changes, and rare but serious risk for AV block and ILD/pneumonitis.  EKG on 01/13/2023 showed normal sinus rhythm, normal QT interval of 372 ms.  This will be monitored periodically as needed.  -MRI of the brain performed on 01/01/2023 showed no definite evidence of intracranial metastatic disease noted.    -Patient has been compliant with Lorlatinib  and tolerating it very well.  Labs today reveal no dose-limiting toxicities.  Alkaline phosphatase decreased from 316 to 92, indicating a positive response to treatment. Calcium  levels slightly down at 8.3.  -Given persistent hypocalcemia, I previously advised him to increase calcium  supplements to total of 1800 mg daily.  -Restaging PET scan on 03/31/2023 showed resolution of hypermetabolic left upper lobe pulmonary nodule.  Complete resolution of hypermetabolic activity within the skeleton.  No evidence of metastatic lymphadenopathy or visceral metastatic disease.  This is consistent with excellent response.  I previously reviewed the images with the patient, his wife and his family member who were accompanying.  They were understandably very happy.  After his recent trip to [place], patient developed swelling in both legs around ankles.  As per our instructions, he held Lorlatinib  from 05/14/2023 and resumed it from 05/18/2023.    He was started on Lasix  20 mg daily as needed.  Recent restaging PET scan on 07/03/2023 continue to show no evidence of FDG avid disease.  It did show soft tissue lesion behind the right hip, stable with mild FDG activity, likely unrelated to his lung cancer diagnosis.  Will obtain MRI for further evaluation.  Will also obtain MRI of the brain for restaging.  Labs today reveal no dose-limiting  toxicities.  Plan is to continue Lorlatinib  100 mg daily.    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   This will be continued every 4 weeks.  Dose given today.  Recently atorvastatin  dose was increased to 20 mg, as triglycerides were elevated from Lorlatinib  use.  RTC in 4 weeks for labs, follow-up and Xgeva  injection.  Next restaging scan will be obtained approximately 3 months from the last scan and we will obtain CT scans going forward.  Pedal edema Bilateral lower extremity edema causing discomfort. Symmetrical and not associated with redness, ruling out thrombosis. Normal protein, albumin, kidney function, and calcium  levels. Likely a side effect of Lorlatinib , known to cause swelling in about 1-10% of patients. Compression socks and conservative measures ineffective. Affecting quality of life. - Prescribed Lasix  (furosemide ) 20 mg once daily as needed for swelling. - Advise leg elevation during rest - Encourage use of compression stockings or Ace wrap - Advised reducing sodium intake  Metastasis to bone (HCC) Previously elevated alkaline phosphatase likely due to bone disease. Discussed the use of Xgeva  to prevent fractures and strengthen bones. -Started Xgeva  injections from 01/27/23 and continue it monthly.  Dose given today. -Given persistent hypocalcemia previously, advised him to continue calcium  supplements at 1800 mg daily dose.  Mixed dyslipidemia Recently triglycerides were elevated, likely from Lorlatinib  use.  Hence his atorvastatin  dose was increased to 20 mg from previous dose of 10 mg.  Repeat lipid panel pending from today.   I reviewed lab results and outside records for this visit and discussed relevant results with the patient. Diagnosis, plan of care and treatment options were also discussed in detail with the patient. Opportunity provided to ask questions and answers provided to his apparent satisfaction. Provided instructions to call our clinic with any problems, questions or concerns prior to return visit. I recommended to continue follow-up with PCP and  sub-specialists. He verbalized understanding and agreed with the plan.   NCCN guidelines have been consulted in the planning of this patient's care.  I spent a total of 40 minutes during this encounter with the patient including review of chart and various tests results, discussions about plan of care and coordination of care plan.   Arlo Berber, MD  07/13/2023 5:06 PM  [facility] - A DEPT OF [facility] [id number] [name] [address] Dept: [phone] Dept Fax: [phone]    CHIEF COMPLAINT/ REASON FOR VISIT:   Adenocarcinoma of left lung with metastasis to the bones.  Stage IV disease.  ALK mutation positive.  Current Treatment: Lorlatinib  100 mg p.o. daily starting from 01/14/2023.  INTERVAL HISTORY:    Discussed the use of AI scribe software for clinical note transcription with the patient, who gave verbal consent to proceed.   Gregg Guerrero is here today for repeat clinical assessment.  He started taking Lorlatinib  from 01/14/2023.   History of Present Illness  He has been experiencing worsening swelling in his legs, which significantly affects his quality of life. The swelling is less pronounced in the mornings but worsens with activity throughout the day. He has attempted various interventions, including leg elevation, compression socks, and Lasix  once daily, but these have not provided significant relief. He describes a sensation of pressure in his toes, feeling as though they are 'squeezing together' and almost numb. No pain or redness in the legs, but he does feel pressure in his hand.  He also  has a trigger finger in his little finger that locks up every morning, a condition that began last month. Despite previous discussions and attempts to manage it, the issue persists.  He is currently taking Lomotil  for diarrhea-related symptoms, consuming two to four tablets a day. He is  concerned about running out of medication soon due to an upcoming vacation. He experiences diarrhea intermittently and has not been using Imodium, an over-the-counter option.  His protein levels, which were slightly low in March, are now normal, with a current protein level of 6.7 and albumin at 4.1. His white blood count has improved to 4,600, which is within the normal range.   Gregg Guerrero is a 73 year old male with stage four lung cancer who presents for follow-up and evaluation of a persistent bright spot on imaging.  He has a history of stage four lung cancer and is undergoing treatment with Lorberina due to a specific mutation. He has been on this treatment for at least six months. Recent imaging from March and June has shown a persistent bright spot behind the right hip joint, which has not changed in size or appearance over the past three months. This spot is not associated with any pain, and its nature is uncertain. The spot is in a difficult area to biopsy.  He experiences swelling in his legs and joint pain in his hands, which he attributes to the side effects of Lorberina. The swelling is persistent, worsens upon standing, and his hands feel tight and swollen. He manages these symptoms with Lasix  as needed.  He is on Xgeva  injections monthly for bone disease. No headaches or vision problems. His caregiver notes some cognitive slowing, which he attributes to the medication, although this is not confirmed.  His current medications include Lorberina, Xgeva , Lasix , and Lipitor, which was recently increased to 20 mg. He also takes Lomotil , typically once in the morning and sometimes in the evening, to manage symptoms. Recent blood work shows stable alkaline phosphatase and normal calcium  levels, with slight dehydration noted. Blood counts have returned to normal after previously being low.   I have reviewed the past medical history, past surgical history, social history and family  history with the patient and they are unchanged from previous note.  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 Additionally there were scattered sclerotic bone lesions diffusely along the skeleton, and numerous calcified lymph nodes throughout the mediastinum and left hilum.    12/04/2022 PET scan   PET scan was notable for a 3.9 cm irregular spiculated mass in the left upper lobe, suspicious for bronchogenic carcinoma. 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  Whole body bone scan confirmed widespread osseous metastases. He denies any associated pain.    12/23/2022 Initial Diagnosis   Malignant neoplasm of upper lobe of left lung [facility]):  He has been under the care of his PCP for what was initially thought to be a pneumonia on CXR. Patient developed shortness of breath in September 2024 and was seen by his primary care physician.  At that time a chest x-ray was done which showed an infiltrate and question of atypical pneumonia.  The patient was treated with antibiotics however his symptoms did not improve.  A CT of the chest was ordered to further evaluate etiology for nonresolving dyspnea.   CT chest on 11/11/2022 which showed a spiculated nodule in the lingula with diffuse interstitial thickening in  the left upper lobe that was concerning for an aggressive process or malignancy.  This led to PET/CT, pulmonology referral and additional evaluation.  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 Per his wife the melanoma was low grade and did not require any further treatment after excision.    12/23/2022 Procedure   Patient had initial consultation in pulmonology clinic on 12/17/2022 and underwent navigational bronchoscopy, EBUS and biopsy of left upper lobe lung nodule on 12/23/2022, performed by Dr. Lucina Sabal.  On EBUS, hilar and mediastinal lymph node stations were examined but no lymph nodes were encountered.  This was suspected to be secondary to severe calcification noted on CT scan.   12/23/2022 Pathology Results   Pathology from left upper lobe lung nodule biopsy came back positive for adenocarcinoma.  Brushings from left upper lobe and lavage showed rare atypical cells.   12/30/2022 Cancer Staging   Staging form: Lung, AJCC 8th Edition - Clinical stage from 12/30/2022: Stage IVB (cT2a, cN0, cM1c) - Signed by Arlo Berber, MD on 12/30/2022 Stage prefix: Initial diagnosis    Miscellaneous   Request submitted for Foundation Medicine testing on the specimen and Guardant 360 (liquid biopsy) for any actionable mutations and also for PD-L1 testing.  Treatment decisions to be made pending these results.   01/05/2023 Pathology Results   Foundation One CDx: ALK + EML4-ALK fusion (Variant 1)  PD-L1 TPS 5%  DNMT3A P904L mutation noted (unknown significance in his tumor type).    01/06/2023 -  Chemotherapy   Patient is on Treatment Plan : LUNG NSCLC Lorlatinib  q28d     03/31/2023 PET scan   IMPRESSION: 1. Resolution of hypermetabolic LEFT upper lobe pulmonary nodule. 2. Complete resolution of hypermetabolic activity within the skeleton. Underlying diffuse sclerotic skeletal disease remains consistent with treated  metastasis. 3. No evidence of metastatic adenopathy or visceral metastasis. 4. Intense of focal activity posterior to the RIGHT femoral neck. This localizes to small focus of extra osseous soft tissue. This lesion is hypermetabolic on comparison exam with no interval change. Favor lesion unrelated to lung carcinoma. Indeterminate activity. Consider MRI of the RIGHT hip for further evaluation.   07/03/2023 PET scan   No evidence of FDG avid disease.  Stable soft tissue lesion behind right femoral neck, consider MRI for further evaluation.       REVIEW OF SYSTEMS:   Review of Systems - Oncology  All other pertinent systems were reviewed with the patient and are negative.  ALLERGIES: He has no known allergies.  MEDICATIONS:  Current Outpatient Medications  Medication Sig Dispense Refill   atorvastatin  (LIPITOR) 20 MG tablet Take 1 tablet (20 mg total) by mouth at bedtime. 30 tablet 3   benzonatate (TESSALON) 200 MG capsule Take 200 mg by mouth 3 (three) times daily as needed for cough.  Calcium  Carbonate-Vit D-Min (CALCIUM  1200 PO) Take 750 mg by mouth in the morning, at noon, and at bedtime. Patient is working towards increasing to 1200 mgs 5 times a day     Cholecalciferol 125 MCG (5000 UT) capsule Take 5,000 Units by mouth daily.     clonazePAM  (KLONOPIN ) 1 MG tablet Take 1 mg by mouth at bedtime.     clopidogrel  (PLAVIX ) 75 MG tablet Take 75 mg by mouth daily.     diphenoxylate -atropine  (LOMOTIL ) 2.5-0.025 MG tablet Take 1 tablet by mouth 4 (four) times daily as needed for diarrhea or loose stools. 90 tablet 1   Ferrous Sulfate (IRON) 325 (65 FE) MG TABS Take 325 mg by mouth daily.     fluticasone (FLONASE) 50 MCG/ACT nasal spray Place 1 spray into both nostrils daily as needed for allergies.     furosemide  (LASIX ) 20 MG tablet Take 1 tablet (20 mg total) by mouth daily as needed for edema. 30 tablet 3   latanoprost (XALATAN) 0.005 % ophthalmic solution Place 1 drop into both eyes at  bedtime.     loratadine  (CLARITIN ) 10 MG tablet Take 10 mg by mouth at bedtime.     lorlatinib  (LORBRENA ) 100 MG tablet Take 1 tablet (100 mg total) by mouth daily. Swallow tablets whole. Do not chew, crush or split tablets. 28 tablet 5   MAGNESIUM PO Take 1 tablet by mouth daily.     Multiple Vitamin (MULTIVITAMIN) tablet Take 1 tablet by mouth daily.     ondansetron  (ZOFRAN -ODT) 8 MG disintegrating tablet Take 1 tablet (8 mg total) by mouth every 8 (eight) hours as needed for nausea or vomiting. 60 tablet 3   PREVIDENT 5000 BOOSTER PLUS 1.1 % PSTE Take 1 Application by mouth once as needed (Dry mouth).     traZODone (DESYREL) 50 MG tablet Take 25 mg by mouth at bedtime.     No current facility-administered medications for this visit.     VITALS:   Blood pressure 138/84, pulse 67, temperature 98.2 F (36.8 C), temperature source Temporal, resp. rate 18, height 6' 1 (1.854 m), weight 214 lb 9.6 oz (97.3 kg), SpO2 100%.  Wt Readings from Last 3 Encounters:  07/13/23 214 lb 9.6 oz (97.3 kg)  06/15/23 209 lb 4.8 oz (94.9 kg)  05/18/23 204 lb 9.6 oz (92.8 kg)    Body mass index is 28.31 kg/m.   Onc Performance Status - 07/13/23 1442       ECOG Perf Status   ECOG Perf Status Restricted in physically strenuous activity but ambulatory and able to carry out work of a light or sedentary nature, e.g., light house work, office work      KPS SCALE   KPS % SCORE Normal, no compliants, no evidence of disease           PHYSICAL EXAM:   Physical Exam Constitutional:      General: He is not in acute distress.    Appearance: Normal appearance.  HENT:     Head: Normocephalic and atraumatic.   Eyes:     General: No scleral icterus.    Conjunctiva/sclera: Conjunctivae normal.    Cardiovascular:     Rate and Rhythm: Normal rate and regular rhythm.     Heart sounds: Normal heart sounds.  Pulmonary:     Effort: Pulmonary effort is normal.     Breath sounds: Normal breath sounds.   Abdominal:     General: There is no distension.   Musculoskeletal:  Right lower leg: Edema (1+) present.     Left lower leg: Edema (1+) present.  Lymphadenopathy:     Cervical: No cervical adenopathy.   Neurological:     General: No focal deficit present.     Mental Status: He is alert and oriented to person, place, and time.   Psychiatric:        Mood and Affect: Mood normal.        Behavior: Behavior normal.        Thought Content: Thought content normal.       LABORATORY DATA:   I have reviewed the data as listed.  Results for orders placed or performed in visit on 07/13/23  Magnesium  Result Value Ref Range   Magnesium 2.2 1.7 - 2.4 mg/dL  CMP (Cancer Center only)  Result Value Ref Range   Sodium 140 135 - 145 mmol/L   Potassium 4.6 3.5 - 5.1 mmol/L   Chloride 106 98 - 111 mmol/L   CO2 20 (L) 22 - 32 mmol/L   Glucose, Bld 142 (H) 70 - 99 mg/dL   BUN 26 (H) 8 - 23 mg/dL   Creatinine 8.29 5.62 - 1.24 mg/dL   Calcium  9.5 8.9 - 10.3 mg/dL   Total Protein 6.8 6.5 - 8.1 g/dL   Albumin 4.2 3.5 - 5.0 g/dL   AST 26 15 - 41 U/L   ALT 29 0 - 44 U/L   Alkaline Phosphatase 105 38 - 126 U/L   Total Bilirubin 0.2 0.0 - 1.2 mg/dL   GFR, Estimated >13 >08 mL/min   Anion gap 14 5 - 15  CBC with Differential (Cancer Center Only)  Result Value Ref Range   WBC Count 5.0 4.0 - 10.5 K/uL   RBC 4.76 4.22 - 5.81 MIL/uL   Hemoglobin 14.1 13.0 - 17.0 g/dL   HCT 65.7 84.6 - 96.2 %   MCV 88.9 80.0 - 100.0 fL   MCH 29.6 26.0 - 34.0 pg   MCHC 33.3 30.0 - 36.0 g/dL   RDW 95.2 84.1 - 32.4 %   Platelet Count 146 (L) 150 - 400 K/uL   nRBC 0.0 0.0 - 0.2 %   Neutrophils Relative % 69 %   Neutro Abs 3.5 1.7 - 7.7 K/uL   Lymphocytes Relative 14 %   Lymphs Abs 0.7 0.7 - 4.0 K/uL   Monocytes Relative 12 %   Monocytes Absolute 0.6 0.1 - 1.0 K/uL   Eosinophils Relative 4 %   Eosinophils Absolute 0.2 0.0 - 0.5 K/uL   Basophils Relative 0 %   Basophils Absolute 0.0 0.0 - 0.1 K/uL    Immature Granulocytes 1 %   Abs Immature Granulocytes 0.03 0.00 - 0.07 K/uL      RADIOGRAPHIC STUDIES:  NM PET Image Restag (PS) Skull Base To Thigh CLINICAL DATA:  Subsequent treatment strategy for left-sided lung cancer with known bone metastasis.  EXAM: NUCLEAR MEDICINE PET SKULL BASE TO THIGH  TECHNIQUE: 10.4 mCi F-18 FDG was injected intravenously. Full-ring PET imaging was performed from the skull base to thigh after the radiotracer. CT data was obtained and used for attenuation correction and anatomic localization.  Fasting blood glucose: 128 mg/dl  COMPARISON:  40/10/2723 PET.  FINDINGS: Mediastinal blood pool activity: SUV max 2.1  Liver activity: SUV max NA  NECK: No areas of abnormal hypermetabolism.  Incidental CT findings: No cervical adenopathy.  CHEST: No pulmonary parenchymal or thoracic nodal hypermetabolism.  Incidental CT findings: Calcified mediastinal and left hilar lymph  nodes again identified. Mild cardiomegaly.  ABDOMEN/PELVIS: No abdominopelvic parenchymal or nodal hypermetabolism.  Incidental CT findings: Abdominal aortic atherosclerosis. Normal adrenal glands. Lower pole punctate right renal collecting system calculus. Bilateral low-density renal lesions are likely cysts at up to 4.4 cm. No specific follow-up indicated. Moderate caudate lobe enlargement without specific evidence of cirrhosi. S mild prostatomegaly. Fat containing left inguinal hernia.  SKELETON: A focus of hypermetabolism posterior to the right femoral neck with suggestion of similar soft tissue fullness. Example at 11 mm and a S.U.V. max of 13.7 (205/4) today versus similar in size and a S.U.V. max of 16.8 on the prior.  No marrow hypermetabolism. Diffuse sclerotic osseous metastasis are similar in severity and distribution.  Incidental CT findings: None.  IMPRESSION: 1. No findings of hypermetabolic residual, recurrent, or metastatic disease. Diffuse sclerotic  osseous lesions are non tracer avid, consistent with treated metastasis. 2. Moderate soft tissue hypermetabolism posterior to the right femoral neck again identified, with similar subtle soft tissue fullness. Favored to represent an incidental soft tissue neoplasm. If further imaging characterization is desired, pre and postcontrast right hip MRI could be performed. 3. Incidental findings, including: Coronary artery atherosclerosis. Aortic Atherosclerosis (ICD10-I70.0). Cardiomegaly. Right nephrolithiasis. Prostatomegaly.  Electronically Signed   By: Lore Rode M.D.   On: 07/03/2023 12:05    CODE STATUS:  Code Status History     Date Active Date Inactive Code Status Order ID Comments User Context   07/20/2014 0933 07/21/2014 1200 Full Code [id number]  Agustina Aldrich, MD Inpatient      Advance Directive Documentation    Flowsheet Row Most Recent Value  Type of Advance Directive Healthcare Power of Attorney, Living will  Pre-existing out of facility DNR order (yellow form or pink MOST form) --  MOST Form in Place? --       Orders Placed This Encounter  Procedures   MR HIP RIGHT W WO CONTRAST    Standing Status:   Future    Expected Date:   07/20/2023    Expiration Date:   07/12/2024    If indicated for the ordered procedure, I authorize the administration of contrast media per Radiology protocol:   Yes    What is the patient's sedation requirement?:   No Sedation    Does the patient have a pacemaker or implanted devices?:   No    Preferred imaging location?:   [facility]   MR Brain W Wo Contrast    Standing Status:   Future    Expected Date:   07/20/2023    Expiration Date:   07/12/2024    If indicated for the ordered procedure, I authorize the administration of contrast media per Radiology protocol:   Yes    What is the patient's sedation requirement?:   No Sedation    Does the patient have a pacemaker or implanted devices?:   No    Use SRS Protocol?:   No     Preferred imaging location?:   [facility]   CBC with Differential (Cancer Center Only)    Standing Status:   Future    Expected Date:   08/10/2023    Expiration Date:   11/08/2023   CMP (Cancer Center only)    Standing Status:   Future    Expected Date:   08/10/2023    Expiration Date:   11/08/2023   Magnesium    Standing Status:   Future    Expected Date:   08/10/2023    Expiration Date:  11/08/2023   Lipid panel    Standing Status:   Future    Expected Date:   08/10/2023    Expiration Date:   11/08/2023     This document was completed utilizing speech recognition software. Grammatical errors, random word insertions, pronoun errors, and incomplete sentences are an occasional consequence of this system due to software limitations, ambient noise, and hardware issues. Any formal questions or concerns about the content, text or information contained within the body of this dictation should be directly addressed to the provider for clarification.

## 2023-07-13 NOTE — Assessment & Plan Note (Signed)
 Recently triglycerides were elevated, likely from Lorlatinib  use.  Hence his atorvastatin  dose was increased to 20 mg from previous dose of 10 mg.  Repeat lipid panel pending from today.

## 2023-07-13 NOTE — Assessment & Plan Note (Signed)
 Bilateral lower extremity edema causing discomfort. Symmetrical and not associated with redness, ruling out thrombosis. Normal protein, albumin, kidney function, and calcium levels. Likely a side effect of Lorlatinib , known to cause swelling in about 1-10% of patients. Compression socks and conservative measures ineffective. Affecting quality of life. - Prescribed Lasix  (furosemide ) 20 mg once daily as needed for swelling. - Advise leg elevation during rest - Encourage use of compression stockings or Ace wrap - Advised reducing sodium intake

## 2023-07-14 ENCOUNTER — Other Ambulatory Visit: Payer: Self-pay

## 2023-07-17 ENCOUNTER — Telehealth: Payer: Self-pay

## 2023-07-17 NOTE — Telephone Encounter (Signed)
 The patient called and left a message indicating that he is awaiting scheduling for his scan. I have forwarded the message to [facility].

## 2023-07-20 ENCOUNTER — Ambulatory Visit (HOSPITAL_COMMUNITY)
Admission: RE | Admit: 2023-07-20 | Discharge: 2023-07-20 | Disposition: A | Discharge status: Home / Self Care | Source: Ambulatory Visit | Attending: Oncology | Admitting: Oncology

## 2023-07-20 DIAGNOSIS — R2241 Localized swelling, mass and lump, right lower limb: Secondary | ICD-10-CM | POA: Diagnosis present

## 2023-07-20 DIAGNOSIS — C3412 Malignant neoplasm of upper lobe, left bronchus or lung: Secondary | ICD-10-CM | POA: Diagnosis present

## 2023-07-20 MED ORDER — GADOBUTROL 1 MMOL/ML IV SOLN
10.0000 mL | Freq: Once | INTRAVENOUS | Status: AC | PRN
  Administered 2023-07-20: 10 mL via INTRAVENOUS

## 2023-07-21 ENCOUNTER — Other Ambulatory Visit: Payer: Self-pay

## 2023-07-30 ENCOUNTER — Other Ambulatory Visit (HOSPITAL_COMMUNITY): Payer: Self-pay

## 2023-08-04 ENCOUNTER — Inpatient Hospital Stay: Attending: Oncology

## 2023-08-04 ENCOUNTER — Inpatient Hospital Stay (HOSPITAL_BASED_OUTPATIENT_CLINIC_OR_DEPARTMENT_OTHER): Admitting: Oncology

## 2023-08-04 ENCOUNTER — Inpatient Hospital Stay

## 2023-08-04 ENCOUNTER — Encounter: Payer: Self-pay | Admitting: Oncology

## 2023-08-04 VITALS — BP 133/81 | HR 78 | Temp 98.0°F | Resp 18 | Ht 73.0 in | Wt 214.0 lb

## 2023-08-04 DIAGNOSIS — C7951 Secondary malignant neoplasm of bone: Secondary | ICD-10-CM

## 2023-08-04 DIAGNOSIS — L089 Local infection of the skin and subcutaneous tissue, unspecified: Secondary | ICD-10-CM | POA: Insufficient documentation

## 2023-08-04 DIAGNOSIS — Z7722 Contact with and (suspected) exposure to environmental tobacco smoke (acute) (chronic): Secondary | ICD-10-CM | POA: Insufficient documentation

## 2023-08-04 DIAGNOSIS — E781 Pure hyperglyceridemia: Secondary | ICD-10-CM | POA: Diagnosis not present

## 2023-08-04 DIAGNOSIS — C3412 Malignant neoplasm of upper lobe, left bronchus or lung: Secondary | ICD-10-CM | POA: Diagnosis present

## 2023-08-04 DIAGNOSIS — Z79899 Other long term (current) drug therapy: Secondary | ICD-10-CM | POA: Diagnosis not present

## 2023-08-04 LAB — CMP (CANCER CENTER ONLY)
ALT: 25 U/L (ref 0–44)
AST: 24 U/L (ref 15–41)
Albumin: 4.3 g/dL (ref 3.5–5.0)
Alkaline Phosphatase: 86 U/L (ref 38–126)
Anion gap: 11 (ref 5–15)
BUN: 20 mg/dL (ref 8–23)
CO2: 21 mmol/L — ABNORMAL LOW (ref 22–32)
Calcium: 9.5 mg/dL (ref 8.9–10.3)
Chloride: 109 mmol/L (ref 98–111)
Creatinine: 1.11 mg/dL (ref 0.61–1.24)
GFR, Estimated: >60 mL/min (ref >60)
Glucose, Bld: 126 mg/dL — ABNORMAL HIGH (ref 70–99)
Potassium: 4.5 mmol/L (ref 3.5–5.1)
Sodium: 142 mmol/L (ref 135–145)
Total Bilirubin: 0.4 mg/dL (ref 0.0–1.2)
Total Protein: 7 g/dL (ref 6.5–8.1)

## 2023-08-04 LAB — CBC WITH DIFFERENTIAL (CANCER CENTER ONLY)
Abs Immature Granulocytes: 0.04 K/uL (ref 0.00–0.07)
Basophils Absolute: 0 K/uL (ref 0.0–0.1)
Basophils Relative: 0 %
Eosinophils Absolute: 0.2 K/uL (ref 0.0–0.5)
Eosinophils Relative: 4 %
HCT: 43.3 % (ref 39.0–52.0)
Hemoglobin: 14.3 g/dL (ref 13.0–17.0)
Immature Granulocytes: 1 %
Lymphocytes Relative: 11 %
Lymphs Abs: 0.7 K/uL (ref 0.7–4.0)
MCH: 29.5 pg (ref 26.0–34.0)
MCHC: 33 g/dL (ref 30.0–36.0)
MCV: 89.3 fL (ref 80.0–100.0)
Monocytes Absolute: 0.8 K/uL (ref 0.1–1.0)
Monocytes Relative: 12 %
Neutro Abs: 4.8 K/uL (ref 1.7–7.7)
Neutrophils Relative %: 72 %
Platelet Count: 146 K/uL — ABNORMAL LOW (ref 150–400)
RBC: 4.85 MIL/uL (ref 4.22–5.81)
RDW: 14.2 % (ref 11.5–15.5)
WBC Count: 6.6 K/uL (ref 4.0–10.5)
nRBC: 0 % (ref 0.0–0.2)

## 2023-08-04 LAB — LIPID PANEL
Cholesterol: 157 mg/dL (ref 0–200)
HDL: 27 mg/dL — ABNORMAL LOW (ref >40)
LDL Cholesterol: 73 mg/dL (ref 0–99)
Total CHOL/HDL Ratio: 5.8 ratio
Triglycerides: 285 mg/dL — ABNORMAL HIGH (ref <150)
VLDL: 57 mg/dL — ABNORMAL HIGH (ref 0–40)

## 2023-08-04 LAB — MAGNESIUM: Magnesium: 2.2 mg/dL (ref 1.7–2.4)

## 2023-08-04 MED ORDER — DENOSUMAB 120 MG/1.7ML ~~LOC~~ SOLN
120.0000 mg | Freq: Once | SUBCUTANEOUS | Status: AC
  Administered 2023-08-04: 120 mg via SUBCUTANEOUS
  Filled 2023-08-04: qty 1.7

## 2023-08-04 MED ORDER — ATORVASTATIN CALCIUM 40 MG PO TABS: 40.0000 mg | ORAL_TABLET | Freq: Every evening | ORAL | 3 refills | Status: DC

## 2023-08-04 NOTE — Progress Notes (Signed)
 [facility]  ONCOLOGY CLINIC PROGRESS NOTE   Patient Care Team: Cleotilde Planas, MD as PCP - General (Family Medicine) Revankar, Jennifer SAUNDERS, MD as PCP - Cardiology (Cardiology) Cindie Ole DASEN, MD as PCP - Electrophysiology (Cardiology) Isadora Hose, MD as Consulting Physician (Pulmonary Disease) Prentis Duwaine BROCKS, RN as Oncology Nurse Navigator  PATIENT NAME: Gregg Guerrero   MR#: [id number] DOB: 1950/03/25  Date of visit: 08/04/2023  ASSESSMENT & PLAN:   Gregg Guerrero is a 73 y.o. 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 No evidence of liver or other visceral involvement. Patient has a history of secondhand smoke exposure. Family history of various cancers.   -Previously I discussed staging, diagnosis, prognosis, plan of care, treatment options with the patient and his wife.  Reviewed NCCN guidelines.  Discussed that all treatment options are palliative in nature and not curative intent, given stage IV disease.  They verbalized understanding.  - Ordered Foundation Medicine testing on the biopsy specimen to identify potential targetable mutations. 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  No other major actionable mutations.  -Given ALK mutation positivity, he would be candidate for targeted therapy.  Plan made to proceed with Lorlatinib  100 mg daily dosing, starting from 01/14/2023.  Side effects include but not limited to: increased blood pressure, edema, changes  in electrolytes, increase in cholesterol and triglycerides, changes in LFTs, peripheral neuropathy, CNS/mood changes, and rare but serious risk for AV block and ILD/pneumonitis.  EKG on 01/13/2023 showed normal sinus rhythm, normal QT interval of 372 ms.  This will be monitored periodically as needed.  -MRI of the brain performed on 01/01/2023 showed no definite evidence of intracranial metastatic disease noted.    -Patient has been compliant with Lorlatinib  and tolerating it very well.  Labs today reveal no dose-limiting toxicities.  Alkaline phosphatase decreased from 316 to 92, indicating a positive response to treatment. Calcium  levels slightly down at 8.3.  -Given persistent hypocalcemia, I previously advised him to increase calcium  supplements to total of 1800 mg daily.  -Restaging PET scan on 03/31/2023 showed resolution of hypermetabolic left upper lobe pulmonary nodule.  Complete resolution of hypermetabolic activity within the skeleton.  No evidence of metastatic lymphadenopathy or visceral metastatic disease.  This is consistent with excellent response.  I previously reviewed the images with the patient, his wife and his family member who were accompanying.  They were understandably very happy.  After his recent trip to [place], patient developed swelling in both legs around ankles.  As per our instructions, he held Lorlatinib  from 05/14/2023 and resumed it from 05/18/2023.    He was started on Lasix  20 mg daily as needed.  Recent restaging PET scan on 07/03/2023 continue to show no evidence of FDG avid disease.  It did show soft tissue lesion behind the right hip, stable with mild FDG activity, likely unrelated to his lung cancer diagnosis.  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  Patient is asymptomatic.  Will continue monitoring alone.  MRI of the brain on 07/20/2023 showed no evidence of intracranial metastatic disease.  Labs today  reveal no dose-limiting toxicities.  Plan is to continue Lorlatinib  100 mg daily.    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  This will be continued every 4 weeks.  Dose given today.  Recently atorvastatin  dose was increased to 20 mg, as triglycerides were elevated from Lorlatinib  use.  Given persistent elevation in triglycerides, we increased atorvastatin  dose to 40 mg daily.  RTC in 4 weeks for labs, follow-up and Xgeva  injection.  Next restaging scan will be obtained approximately 3 months from the last scan and we will obtain CT scans going forward.  To be ordered on future visit.  Hypertriglyceridemia Triglyceride levels increased from 205 to 429, likely due to medication. LDL levels are stable at 111. Current atorvastatin  dose is 20 mg. - Increase atorvastatin  to 40 mg daily - Monitor triglyceride levels - Consider additional medication if triglycerides continue to rise - Discussed potential side effects such as muscle pain or cramps  Infected left big toe Infection likely secondary to improper toenail trimming. The toe is red, warm, and swollen, indicating an active infection. No streaking observed, suggesting no lymphangitis. - Refer to urgent care for evaluation and management - Consider podiatry referral if urgent care deems necessary   I reviewed lab results and outside records for this visit and discussed relevant results with the patient. Diagnosis, plan of care and treatment options were also discussed in detail with the patient. Opportunity provided to ask questions and answers provided to his apparent satisfaction. Provided instructions to call our clinic with any problems, questions or concerns prior to return visit. I recommended to continue follow-up with PCP and sub-specialists. He verbalized understanding and agreed with the plan.   NCCN guidelines have been consulted in the planning of this patient's care.  I spent a total of 30 minutes  during this encounter with the patient including review of chart and various tests results, discussions about plan of care and coordination of care plan.   Chinita Patten, MD  08/04/2023 5:22 PM  [facility] - A DEPT OF [facility] [address] [name] [address] Dept: [phone] Dept Fax: [phone]    CHIEF COMPLAINT/ REASON FOR VISIT:   Adenocarcinoma of left lung with metastasis to the bones.  Stage IV disease.  ALK mutation positive.  Current Treatment: Lorlatinib  100 mg p.o. daily starting from 01/14/2023.  INTERVAL HISTORY:    Discussed the use of AI scribe software for clinical note transcription with the patient, who gave verbal consent to proceed.   Gregg Guerrero is here today for repeat clinical assessment.  He started taking Lorlatinib  from 01/14/2023.   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 He is accompanied by his wife.  He has metastatic bone disease secondary to lung cancer, with known involvement in the cervical spine. A recent MRI of the brain showed no concerning findings, and a previously noted small focus in the left parietal lobe is no longer visible. Imaging also revealed a 16 mm area in the hip, but he does not report any symptoms related to this area.  He experiences persistent swelling in his joints, which has necessitated the removal  of his wedding ring. The joints remain tight. Additionally, there is redness and swelling in his left big toe, which he attributes to trimming his toenail too deeply. He has not previously seen a podiatrist for this issue.  He is currently taking atorvastatin  20 mg for hyperlipidemia. His triglycerides have increased from 205 to 429, while his LDL remains stable at  111. He has not experienced any muscle pain or cramps with the current dose. He reports a normal lifestyle and is not currently experiencing diarrhea or loose stools.  His alkaline phosphatase level has decreased from 105 to 86, and his calcium  level is stable at 9.5. He takes calcium  supplements at a dose of 600 mg three times a day, totaling 1800 mg daily. His blood counts, including white cell count and hemoglobin, are within normal limits.   I have reviewed the past medical history, past surgical history, social history and family history with the patient and they are unchanged from previous note.  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 Additionally there were scattered sclerotic bone lesions diffusely along the skeleton, and numerous calcified lymph nodes throughout the mediastinum and left hilum.    12/04/2022 PET scan   PET scan was notable for a 3.9 cm irregular spiculated mass in the left upper lobe, suspicious for bronchogenic carcinoma. 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  Whole body bone scan confirmed widespread osseous metastases. He denies any associated pain.    12/23/2022 Initial Diagnosis   Malignant neoplasm of upper lobe of left lung [facility]):  He has been under the care of  his PCP for what was initially thought to be a pneumonia on CXR. Patient developed shortness of breath in September 2024 and was seen by his primary care physician.  At that time a chest x-ray was done which showed an infiltrate and question of atypical pneumonia.  The patient was treated with antibiotics however his symptoms did not improve.  A CT of the chest was ordered to further evaluate etiology for nonresolving dyspnea.   CT chest on 11/11/2022 which showed a spiculated nodule in the lingula with diffuse interstitial thickening in the left upper lobe that was concerning for an aggressive process or malignancy.  This led to PET/CT, pulmonology referral and additional evaluation.  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 Per his wife the melanoma was low grade and did not require any further treatment after excision.    12/23/2022 Procedure   Patient had initial consultation in pulmonology clinic on 12/17/2022 and underwent navigational bronchoscopy, EBUS and biopsy of left upper lobe lung nodule on 12/23/2022, performed by Dr. Malka.  On EBUS, hilar and mediastinal lymph node stations were examined but no lymph nodes were encountered.  This was  suspected to be secondary to severe calcification noted on CT scan.   12/23/2022 Pathology Results   Pathology from left upper lobe lung nodule biopsy came back positive for adenocarcinoma.  Brushings from left upper lobe and lavage showed rare atypical cells.   12/30/2022 Cancer Staging   Staging form: Lung, AJCC 8th Edition - Clinical stage from 12/30/2022: Stage IVB (cT2a, cN0, cM1c) - Signed by Autumn Millman, MD on 12/30/2022 Stage prefix: Initial diagnosis    Miscellaneous   Request submitted for Foundation Medicine testing on the specimen and Guardant 360 (liquid biopsy) for any actionable mutations and also for PD-L1 testing.  Treatment decisions to be made  pending these results.   01/05/2023 Pathology Results   Foundation One CDx: ALK + EML4-ALK fusion (Variant 1)  PD-L1 TPS 5%  DNMT3A P904L mutation noted (unknown significance in his tumor type).    01/06/2023 -  Chemotherapy   Patient is on Treatment Plan : LUNG NSCLC Lorlatinib  q28d     03/31/2023 PET scan   IMPRESSION: 1. Resolution of hypermetabolic LEFT upper lobe pulmonary nodule. 2. Complete resolution of hypermetabolic activity within the skeleton. Underlying diffuse sclerotic skeletal disease remains consistent with treated metastasis. 3. No evidence of metastatic adenopathy or visceral metastasis. 4. Intense of focal activity posterior to the RIGHT femoral neck. This localizes to small focus of extra osseous soft tissue. This lesion is hypermetabolic on comparison exam with no interval change. Favor lesion unrelated to lung carcinoma. Indeterminate activity. Consider MRI of the RIGHT hip for further evaluation.   07/03/2023 PET scan   No evidence of FDG avid disease.  Stable soft tissue lesion behind right femoral neck, consider MRI for further evaluation.       REVIEW OF SYSTEMS:   Review of Systems - Oncology  All other pertinent systems were reviewed with the patient and are negative.  ALLERGIES: He has no known allergies.  MEDICATIONS:  Current Outpatient Medications  Medication Sig Dispense Refill   benzonatate (TESSALON) 200 MG capsule Take 200 mg by mouth 3 (three) times daily as needed for cough.     calcium  carbonate (SUPER CALCIUM ) 1500 (600 Ca) MG TABS tablet Take 600 mg of elemental calcium  by mouth 3 (three) times daily with meals.     cetirizine (ZYRTEC) 10 MG tablet 10 mg daily.     Cholecalciferol 125 MCG (5000 UT) capsule Take 5,000 Units by mouth daily.     clonazePAM  (KLONOPIN ) 1 MG tablet Take 1 mg by mouth at bedtime.     clopidogrel  (PLAVIX ) 75 MG tablet Take 75 mg by mouth daily.     diphenoxylate -atropine  (LOMOTIL ) 2.5-0.025 MG tablet Take 1  tablet by mouth 4 (four) times daily as needed for diarrhea or loose stools. 90 tablet 1   Ferrous Sulfate (IRON) 325 (65 FE) MG TABS Take 325 mg by mouth daily.     fluticasone (FLONASE) 50 MCG/ACT nasal spray Place 1 spray into both nostrils daily as needed for allergies.     furosemide  (LASIX ) 20 MG tablet Take 1 tablet (20 mg total) by mouth daily as needed for edema. 30 tablet 3   Ibuprofen 200 MG CAPS Take 1 capsule by mouth 3 (three) times daily as needed (pain).     latanoprost (XALATAN) 0.005 % ophthalmic solution Place 1 drop into both eyes at bedtime.     loratadine  (CLARITIN ) 10 MG tablet Take 10 mg by mouth at bedtime.     lorlatinib  (LORBRENA ) 100 MG tablet Take 1 tablet (100  mg total) by mouth daily. Swallow tablets whole. Do not chew, crush or split tablets. 28 tablet 5   MAGNESIUM PO Take 1 tablet by mouth daily.     Multiple Vitamin (MULTIVITAMIN) tablet Take 1 tablet by mouth daily.     omeprazole  (PRILOSEC) 20 MG capsule Take 20 mg by mouth daily.     ondansetron  (ZOFRAN -ODT) 8 MG disintegrating tablet Take 1 tablet (8 mg total) by mouth every 8 (eight) hours as needed for nausea or vomiting. 60 tablet 3   PREVIDENT 5000 BOOSTER PLUS 1.1 % PSTE Take 1 Application by mouth once as needed (Dry mouth).     traZODone (DESYREL) 50 MG tablet Take 25 mg by mouth at bedtime.     atorvastatin  (LIPITOR) 40 MG tablet Take 1 tablet (40 mg total) by mouth at bedtime. 30 tablet 3   No current facility-administered medications for this visit.     VITALS:   Blood pressure 133/81, pulse 78, temperature 98 F (36.7 C), temperature source Oral, resp. rate 18, height 6' 1 (1.854 m), weight 214 lb (97.1 kg), SpO2 96%.  Wt Readings from Last 3 Encounters:  08/04/23 214 lb (97.1 kg)  07/13/23 214 lb 9.6 oz (97.3 kg)  06/15/23 209 lb 4.8 oz (94.9 kg)    Body mass index is 28.23 kg/m.   Onc Performance Status - 08/04/23 1154       ECOG Perf Status   ECOG Perf Status Restricted in  physically strenuous activity but ambulatory and able to carry out work of a light or sedentary nature, e.g., light house work, office work      KPS SCALE   KPS % SCORE Normal, no compliants, no evidence of disease            PHYSICAL EXAM:   Physical Exam Constitutional:      General: He is not in acute distress.    Appearance: Normal appearance.  HENT:     Head: Normocephalic and atraumatic.  Eyes:     General: No scleral icterus.    Conjunctiva/sclera: Conjunctivae normal.  Cardiovascular:     Rate and Rhythm: Normal rate and regular rhythm.     Heart sounds: Normal heart sounds.  Pulmonary:     Effort: Pulmonary effort is normal.     Breath sounds: Normal breath sounds.  Abdominal:     General: There is no distension.  Musculoskeletal:     Right lower leg: Edema (1+) present.     Left lower leg: Edema (1+) present.  Lymphadenopathy:     Cervical: No cervical adenopathy.  Neurological:     General: No focal deficit present.     Mental Status: He is alert and oriented to person, place, and time.  Psychiatric:        Mood and Affect: Mood normal.        Behavior: Behavior normal.        Thought Content: Thought content normal.       LABORATORY DATA:   I have reviewed the data as listed.  Results for orders placed or performed in visit on 08/04/23  Lipid panel  Result Value Ref Range   Cholesterol 157 0 - 200 mg/dL   Triglycerides 714 (H) <150 mg/dL   HDL 27 (L) >59 mg/dL   Total CHOL/HDL Ratio 5.8 RATIO   VLDL 57 (H) 0 - 40 mg/dL   LDL Cholesterol 73 0 - 99 mg/dL  Magnesium  Result Value Ref Range   Magnesium 2.2 1.7 -  2.4 mg/dL  CMP (Cancer Center only)  Result Value Ref Range   Sodium 142 135 - 145 mmol/L   Potassium 4.5 3.5 - 5.1 mmol/L   Chloride 109 98 - 111 mmol/L   CO2 21 (L) 22 - 32 mmol/L   Glucose, Bld 126 (H) 70 - 99 mg/dL   BUN 20 8 - 23 mg/dL   Creatinine 8.88 9.38 - 1.24 mg/dL   Calcium  9.5 8.9 - 10.3 mg/dL   Total Protein 7.0 6.5  - 8.1 g/dL   Albumin 4.3 3.5 - 5.0 g/dL   AST 24 15 - 41 U/L   ALT 25 0 - 44 U/L   Alkaline Phosphatase 86 38 - 126 U/L   Total Bilirubin 0.4 0.0 - 1.2 mg/dL   GFR, Estimated >39 >39 mL/min   Anion gap 11 5 - 15  CBC with Differential (Cancer Center Only)  Result Value Ref Range   WBC Count 6.6 4.0 - 10.5 K/uL   RBC 4.85 4.22 - 5.81 MIL/uL   Hemoglobin 14.3 13.0 - 17.0 g/dL   HCT 56.6 60.9 - 47.9 %   MCV 89.3 80.0 - 100.0 fL   MCH 29.5 26.0 - 34.0 pg   MCHC 33.0 30.0 - 36.0 g/dL   RDW 85.7 88.4 - 84.4 %   Platelet Count 146 (L) 150 - 400 K/uL   nRBC 0.0 0.0 - 0.2 %   Neutrophils Relative % 72 %   Neutro Abs 4.8 1.7 - 7.7 K/uL   Lymphocytes Relative 11 %   Lymphs Abs 0.7 0.7 - 4.0 K/uL   Monocytes Relative 12 %   Monocytes Absolute 0.8 0.1 - 1.0 K/uL   Eosinophils Relative 4 %   Eosinophils Absolute 0.2 0.0 - 0.5 K/uL   Basophils Relative 0 %   Basophils Absolute 0.0 0.0 - 0.1 K/uL   Immature Granulocytes 1 %   Abs Immature Granulocytes 0.04 0.00 - 0.07 K/uL      RADIOGRAPHIC STUDIES:  MR Brain W Wo Contrast CLINICAL DATA:  Provided history: Malignant neoplasm of upper lobe of left lung. Stage IV lung cancer. Surveillance.  EXAM: MRI HEAD WITHOUT AND WITH CONTRAST  TECHNIQUE: Multiplanar, multiecho pulse sequences of the brain and surrounding structures were obtained without and with intravenous contrast.  CONTRAST:  10mL GADAVIST  GADOBUTROL  1 MMOL/ML IV SOLN  COMPARISON:  Brain MRI 01/01/2023.  FINDINGS: Brain:  Mild generalized cerebral atrophy.  Tiny chronic cortical infarct within the posterior left frontal lobe (postcentral gyrus) (series 9, image 43).  Chronic lacunar infarcts within the left caudate nucleus and bilateral thalami.  Small chronic infarcts within the bilateral cerebellar hemispheres.  There is no acute infarct.  No evidence of an intracranial mass.  No chronic intracranial blood products.  No extra-axial fluid  collection.  No midline shift.  No pathologic intracranial enhancement identified. The 2 mm focus of enhancement demonstrated within the left parietal lobe periventricular white matter on the brain MRI of 01/01/2023 is not present on today's examination.  Vascular: Maintained flow voids within the proximal large arterial vessels.  Skull and upper cervical spine: Right parietal burr hole. Known widespread sclerotic osseous metastatic disease.  Sinuses/Orbits: No mass or acute finding within the imaged orbits. No significant paranasal sinus disease.  Other: Trace fluid within right mastoid air cells.  IMPRESSION: 1. No evidence of intracranial metastatic disease. The previously demonstrated 2 mm focus of enhancement in the left parietal lobe periventricular white matter is not present on today's study. 2. 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 3. Mild generalized cerebral atrophy. 4. Known widespread sclerotic osseous metastatic disease.  Electronically Signed   By: Rockey Childs D.O.   On: 07/30/2023 12:05    CODE STATUS:  Code Status History     Date Active Date Inactive Code Status Order ID Comments User Context   07/20/2014 0933 07/21/2014 1200 Full Code [id number]  Louis Shove, MD Inpatient      Advance Directive Documentation    Flowsheet Row Most Recent Value  Type of Advance Directive Healthcare Power of Attorney, Living will  Pre-existing out of facility DNR order (yellow form or pink MOST form) --  MOST Form in Place? --       Orders Placed This Encounter  Procedures   CBC with Differential (Cancer Center Only)    Standing Status:   Standing    Number of Occurrences:   6    Expiration Date:   08/03/2024   CMP (Cancer Center only)    Standing Status:   Standing    Number of Occurrences:   6    Expiration Date:   08/03/2024   Magnesium    Standing Status:   Standing     Number of Occurrences:   6    Expiration Date:   08/03/2024   Lipid panel    Standing Status:   Standing    Number of Occurrences:   6    Expiration Date:   08/03/2024     This document was completed utilizing speech recognition software. Grammatical errors, random word insertions, pronoun errors, and incomplete sentences are an occasional consequence of this system due to software limitations, ambient noise, and hardware issues. Any formal questions or concerns about the content, text or information contained within the body of this dictation should be directly addressed to the provider for clarification.

## 2023-08-04 NOTE — Progress Notes (Signed)
 Per Dr. Autumn ok to treat with Xgeva  one week early.   Alfonso MARLA Buys, PharmD Pharmacy Resident  08/04/2023 1:06 PM

## 2023-08-04 NOTE — Assessment & Plan Note (Signed)
 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 No evidence of liver or other visceral involvement. Patient has a history of secondhand smoke exposure. Family history of various cancers.   -Previously I discussed staging, diagnosis, prognosis, plan of care, treatment options with the patient and his wife.  Reviewed NCCN guidelines.  Discussed that all treatment options are palliative in nature and not curative intent, given stage IV disease.  They verbalized understanding.  - Ordered [facility] testing on the biopsy specimen to identify potential targetable mutations. 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  No other major actionable mutations.  -Given ALK mutation positivity, he would be candidate for targeted therapy.  Plan made to proceed with Lorlatinib  100 mg daily dosing, starting from 01/14/2023.  Side effects include but not limited to: increased blood pressure, edema, changes in electrolytes, increase in cholesterol and triglycerides, changes in LFTs, peripheral neuropathy, CNS/mood changes, and rare but serious risk for AV block and ILD/pneumonitis.  EKG on 01/13/2023 showed normal sinus rhythm, normal QT interval of 372 ms.  This will be monitored periodically as needed.  -MRI of the brain performed on 01/01/2023 showed no definite evidence of intracranial metastatic disease noted.    -Patient has been compliant with Lorlatinib  and tolerating it very well.  Labs today reveal no dose-limiting toxicities.  Alkaline phosphatase decreased from 316 to 92, indicating a positive response to treatment. Calcium  levels slightly down at 8.3.  -Given persistent hypocalcemia, I previously advised him to increase calcium  supplements to total of 1800 mg daily.  -Restaging PET scan on 03/31/2023 showed resolution of hypermetabolic left upper lobe  pulmonary nodule.  Complete resolution of hypermetabolic activity within the skeleton.  No evidence of metastatic lymphadenopathy or visceral metastatic disease.  This is consistent with excellent response.  I previously reviewed the images with the patient, his wife and his family member who were accompanying.  They were understandably very happy.  After his recent trip to [place], patient developed swelling in both legs around ankles.  As per our instructions, he held Lorlatinib  from 05/14/2023 and resumed it from 05/18/2023.    He was started on Lasix  20 mg daily as needed.  Recent restaging PET scan on 07/03/2023 continue to show no evidence of FDG avid disease.  It did show soft tissue lesion behind the right hip, stable with mild FDG activity, likely unrelated to his lung cancer diagnosis.  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  Patient is asymptomatic.  Will continue monitoring alone.  MRI of the brain on 07/20/2023 showed no evidence of intracranial metastatic disease.  Labs today reveal no dose-limiting toxicities.  Plan is to continue Lorlatinib  100 mg daily.    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  This will be continued every 4 weeks.  Dose given today.  Recently atorvastatin  dose was increased to 20 mg, as triglycerides were elevated from Lorlatinib  use.  Given persistent elevation in triglycerides, we increased atorvastatin  dose to 40 mg daily.  RTC in 4 weeks for labs, follow-up and Xgeva  injection.  Next restaging scan will be obtained approximately 3 months from the last scan and we will obtain CT scans going forward.  To be ordered on future visit.

## 2023-08-04 NOTE — Assessment & Plan Note (Signed)
 Triglyceride levels increased from 205 to 429, likely due to medication. LDL levels are stable at 111. Current atorvastatin  dose is 20 mg. - Increase atorvastatin  to 40 mg daily - Monitor triglyceride levels - Consider additional medication if triglycerides continue to rise - Discussed potential side effects such as muscle pain or cramps

## 2023-08-04 NOTE — Patient Instructions (Signed)
 Denosumab Injection (Oncology) What is this medication? DENOSUMAB (den oh SUE mab) prevents weakened bones caused by cancer. It may also be used to treat noncancerous bone tumors that cannot be removed by surgery. It can also be used to treat high calcium levels in the blood caused by cancer. It works by blocking a protein that causes bones to break down quickly. This slows down the release of calcium from bones, which lowers calcium levels in your blood. It also makes your bones stronger and less likely to break (fracture). This medicine may be used for other purposes; ask your health care provider or pharmacist if you have questions. COMMON BRAND NAME(S): XGEVA What should I tell my care team before I take this medication? They need to know if you have any of these conditions: Dental disease Having surgery or tooth extraction Infection Kidney disease Low levels of calcium or vitamin D in the blood Malnutrition On hemodialysis Skin conditions or sensitivity Thyroid or parathyroid disease An unusual reaction to denosumab, other medications, foods, dyes, or preservatives Pregnant or trying to get pregnant Breast-feeding How should I use this medication? This medication is for injection under the skin. It is given by your care team in a hospital or clinic setting. A special MedGuide will be given to you before each treatment. Be sure to read this information carefully each time. Talk to your care team about the use of this medication in children. While it may be prescribed for children as young as 13 years for selected conditions, precautions do apply. Overdosage: If you think you have taken too much of this medicine contact a poison control center or emergency room at once. NOTE: This medicine is only for you. Do not share this medicine with others. What if I miss a dose? Keep appointments for follow-up doses. It is important not to miss your dose. Call your care team if you are unable to  keep an appointment. What may interact with this medication? Do not take this medication with any of the following: Other medications containing denosumab This medication may also interact with the following: Medications that lower your chance of fighting infection Steroid medications, such as prednisone or cortisone This list may not describe all possible interactions. Give your health care provider a list of all the medicines, herbs, non-prescription drugs, or dietary supplements you use. Also tell them if you smoke, drink alcohol, or use illegal drugs. Some items may interact with your medicine. What should I watch for while using this medication? Your condition will be monitored carefully while you are receiving this medication. You may need blood work while taking this medication. This medication may increase your risk of getting an infection. Call your care team for advice if you get a fever, chills, sore throat, or other symptoms of a cold or flu. Do not treat yourself. Try to avoid being around people who are sick. You should make sure you get enough calcium and vitamin D while you are taking this medication, unless your care team tells you not to. Discuss the foods you eat and the vitamins you take with your care team. Some people who take this medication have severe bone, joint, or muscle pain. This medication may also increase your risk for jaw problems or a broken thigh bone. Tell your care team right away if you have severe pain in your jaw, bones, joints, or muscles. Tell your care team if you have any pain that does not go away or that gets worse. Talk  to your care team if you may be pregnant. Serious birth defects can occur if you take this medication during pregnancy and for 5 months after the last dose. You will need a negative pregnancy test before starting this medication. Contraception is recommended while taking this medication and for 5 months after the last dose. Your care team  can help you find the option that works for you. What side effects may I notice from receiving this medication? Side effects that you should report to your care team as soon as possible: Allergic reactions--skin rash, itching, hives, swelling of the face, lips, tongue, or throat Bone, joint, or muscle pain Low calcium level--muscle pain or cramps, confusion, tingling, or numbness in the hands or feet Osteonecrosis of the jaw--pain, swelling, or redness in the mouth, numbness of the jaw, poor healing after dental work, unusual discharge from the mouth, visible bones in the mouth Side effects that usually do not require medical attention (report to your care team if they continue or are bothersome): Cough Diarrhea Fatigue Headache Nausea This list may not describe all possible side effects. Call your doctor for medical advice about side effects. You may report side effects to FDA at 1-800-FDA-1088. Where should I keep my medication? This medication is given in a hospital or clinic. It will not be stored at home. NOTE: This sheet is a summary. It may not cover all possible information. If you have questions about this medicine, talk to your doctor, pharmacist, or health care provider.  2024 Elsevier/Gold Standard (2021-06-05 00:00:00)

## 2023-08-05 ENCOUNTER — Other Ambulatory Visit: Payer: Self-pay

## 2023-08-05 NOTE — Progress Notes (Signed)
 Specialty Pharmacy Refill Coordination Note  Gregg Guerrero is a 73 y.o. male contacted today regarding refills of specialty medication(s) Lorlatinib  (LORBRENA )   Patient requested Delivery   Delivery date: 08/07/23   Verified address: [address]  [place]   Medication will be filled on 08/06/23.

## 2023-08-05 NOTE — Progress Notes (Signed)
 Specialty Pharmacy Ongoing Clinical Assessment Note  Gregg Guerrero is a 73 y.o. male who is being followed by the specialty pharmacy service for RxSp Oncology   Patient's specialty medication(s) reviewed today: Lorlatinib  (LORBRENA )   Missed doses in the last 4 weeks: 0   Patient/Caregiver did not have any additional questions or concerns.   Therapeutic benefit summary: Patient is achieving benefit   Adverse events/side effects summary: Experienced adverse events/side effects (leg swelling, monitored by MD and taking lasix  daily)   Patient's therapy is appropriate to: Continue    Goals Addressed             This Visit's Progress    Maintain optimal adherence to therapy   On track    Patient is on track. Patient will maintain adherence         Follow up: 3 months  [facility] Specialty Pharmacist

## 2023-08-06 ENCOUNTER — Other Ambulatory Visit: Payer: Self-pay

## 2023-08-06 ENCOUNTER — Other Ambulatory Visit (HOSPITAL_COMMUNITY): Payer: Self-pay

## 2023-08-07 ENCOUNTER — Other Ambulatory Visit: Payer: Self-pay

## 2023-08-07 ENCOUNTER — Other Ambulatory Visit (HOSPITAL_COMMUNITY): Payer: Self-pay

## 2023-08-11 ENCOUNTER — Other Ambulatory Visit

## 2023-08-11 ENCOUNTER — Ambulatory Visit

## 2023-08-11 ENCOUNTER — Ambulatory Visit: Admitting: Oncology

## 2023-08-15 ENCOUNTER — Other Ambulatory Visit: Payer: Self-pay

## 2023-08-20 ENCOUNTER — Other Ambulatory Visit: Payer: Self-pay

## 2023-08-25 ENCOUNTER — Other Ambulatory Visit (HOSPITAL_COMMUNITY): Payer: Self-pay

## 2023-08-25 NOTE — Telephone Encounter (Signed)
 SABRA

## 2023-09-01 ENCOUNTER — Other Ambulatory Visit: Payer: Self-pay | Admitting: Oncology

## 2023-09-01 ENCOUNTER — Other Ambulatory Visit (HOSPITAL_BASED_OUTPATIENT_CLINIC_OR_DEPARTMENT_OTHER): Payer: Self-pay

## 2023-09-01 MED ORDER — FUROSEMIDE 20 MG PO TABS
20.0000 mg | ORAL_TABLET | Freq: Every day | ORAL | 3 refills | Status: DC | PRN
  Filled 2023-09-01: qty 30, 30d supply, fill #0

## 2023-09-07 ENCOUNTER — Other Ambulatory Visit: Payer: Self-pay | Admitting: Pharmacy Technician

## 2023-09-07 ENCOUNTER — Other Ambulatory Visit: Payer: Self-pay

## 2023-09-07 NOTE — Progress Notes (Signed)
 Specialty Pharmacy Refill Coordination Note  Gregg Guerrero is a 73 y.o. male contacted today regarding refills of specialty medication(s) Lorlatinib  (LORBRENA )   Patient requested Delivery   Delivery date: 09/10/23   Verified address: [address]   [name] [address]   Medication will be filled on 09/09/23. Sandi to order med.

## 2023-09-08 ENCOUNTER — Inpatient Hospital Stay (HOSPITAL_BASED_OUTPATIENT_CLINIC_OR_DEPARTMENT_OTHER): Admitting: Oncology

## 2023-09-08 ENCOUNTER — Inpatient Hospital Stay

## 2023-09-08 ENCOUNTER — Inpatient Hospital Stay: Attending: Oncology

## 2023-09-08 ENCOUNTER — Encounter: Payer: Self-pay | Admitting: Oncology

## 2023-09-08 VITALS — BP 143/78 | HR 73 | Temp 97.7°F | Resp 18 | Ht 73.0 in | Wt 218.0 lb

## 2023-09-08 DIAGNOSIS — R197 Diarrhea, unspecified: Secondary | ICD-10-CM

## 2023-09-08 DIAGNOSIS — C3412 Malignant neoplasm of upper lobe, left bronchus or lung: Secondary | ICD-10-CM

## 2023-09-08 DIAGNOSIS — E781 Pure hyperglyceridemia: Secondary | ICD-10-CM | POA: Insufficient documentation

## 2023-09-08 DIAGNOSIS — C7951 Secondary malignant neoplasm of bone: Secondary | ICD-10-CM | POA: Diagnosis not present

## 2023-09-08 DIAGNOSIS — Z79899 Other long term (current) drug therapy: Secondary | ICD-10-CM | POA: Diagnosis not present

## 2023-09-08 LAB — CBC WITH DIFFERENTIAL (CANCER CENTER ONLY)
Abs Immature Granulocytes: 0.03 K/uL (ref 0.00–0.07)
Basophils Absolute: 0 K/uL (ref 0.0–0.1)
Basophils Relative: 1 %
Eosinophils Absolute: 0.2 K/uL (ref 0.0–0.5)
Eosinophils Relative: 5 %
HCT: 42.7 % (ref 39.0–52.0)
Hemoglobin: 14.2 g/dL (ref 13.0–17.0)
Immature Granulocytes: 1 %
Lymphocytes Relative: 14 %
Lymphs Abs: 0.7 K/uL (ref 0.7–4.0)
MCH: 30 pg (ref 26.0–34.0)
MCHC: 33.3 g/dL (ref 30.0–36.0)
MCV: 90.1 fL (ref 80.0–100.0)
Monocytes Absolute: 0.6 K/uL (ref 0.1–1.0)
Monocytes Relative: 12 %
Neutro Abs: 3.4 K/uL (ref 1.7–7.7)
Neutrophils Relative %: 67 %
Platelet Count: 149 K/uL — ABNORMAL LOW (ref 150–400)
RBC: 4.74 MIL/uL (ref 4.22–5.81)
RDW: 14.3 % (ref 11.5–15.5)
WBC Count: 5 K/uL (ref 4.0–10.5)
nRBC: 0 % (ref 0.0–0.2)

## 2023-09-08 LAB — CMP (CANCER CENTER ONLY)
ALT: 33 U/L (ref 0–44)
AST: 27 U/L (ref 15–41)
Albumin: 4.3 g/dL (ref 3.5–5.0)
Alkaline Phosphatase: 96 U/L (ref 38–126)
Anion gap: 13 (ref 5–15)
BUN: 22 mg/dL (ref 8–23)
CO2: 21 mmol/L — ABNORMAL LOW (ref 22–32)
Calcium: 9.7 mg/dL (ref 8.9–10.3)
Chloride: 108 mmol/L (ref 98–111)
Creatinine: 1.11 mg/dL (ref 0.61–1.24)
GFR, Estimated: >60 mL/min (ref >60)
Glucose, Bld: 127 mg/dL — ABNORMAL HIGH (ref 70–99)
Potassium: 4.5 mmol/L (ref 3.5–5.1)
Sodium: 141 mmol/L (ref 135–145)
Total Bilirubin: 0.3 mg/dL (ref 0.0–1.2)
Total Protein: 6.7 g/dL (ref 6.5–8.1)

## 2023-09-08 LAB — LIPID PANEL
Cholesterol: 157 mg/dL (ref 0–200)
HDL: 30 mg/dL — ABNORMAL LOW (ref >40)
LDL Cholesterol: 73 mg/dL (ref 0–99)
Total CHOL/HDL Ratio: 5.2 ratio
Triglycerides: 268 mg/dL — ABNORMAL HIGH (ref <150)
VLDL: 54 mg/dL — ABNORMAL HIGH (ref 0–40)

## 2023-09-08 LAB — MAGNESIUM: Magnesium: 2 mg/dL (ref 1.7–2.4)

## 2023-09-08 MED ORDER — DENOSUMAB 120 MG/1.7ML ~~LOC~~ SOLN
120.0000 mg | Freq: Once | SUBCUTANEOUS | Status: AC
Start: 2023-09-08 — End: 2023-09-08
  Administered 2023-09-08 (×2): 120 mg via SUBCUTANEOUS
  Filled 2023-09-08: qty 1.7

## 2023-09-08 MED ORDER — FUROSEMIDE 20 MG PO TABS: 20.0000 mg | ORAL_TABLET | Freq: Every day | ORAL | 3 refills | Status: AC | PRN

## 2023-09-08 MED ORDER — DIPHENOXYLATE-ATROPINE 2.5-0.025 MG PO TABS: 1.0000 | ORAL_TABLET | Freq: Four times a day (QID) | ORAL | 1 refills | Status: DC | PRN

## 2023-09-08 NOTE — Assessment & Plan Note (Signed)
 Triglyceride levels increased from 205 to 429 as of June 2025, likely due to medication. LDL levels are stable at 111. Current atorvastatin  dose is 20 mg. - Increased atorvastatin  to 40 mg daily from July 2025.  - Monitor triglyceride levels.  Repeat values are pending from today. - Consider additional medication if triglycerides continue to rise - Discussed potential side effects such as muscle pain or cramps

## 2023-09-08 NOTE — Patient Instructions (Signed)
 Denosumab  Injection (Oncology) What is this medication? DENOSUMAB  (den oh SUE mab) prevents weakened bones caused by cancer. It may also be used to treat noncancerous bone tumors that cannot be removed by surgery. It can also be used to treat high calcium  levels in the blood caused by cancer. It works by blocking a protein that causes bones to break down quickly. This slows down the release of calcium  from bones, which lowers calcium  levels in your blood. It also makes your bones stronger and less likely to break (fracture). This medicine may be used for other purposes; ask your health care provider or pharmacist if you have questions. COMMON BRAND NAME(S): XGEVA  What should I tell my care team before I take this medication? They need to know if you have any of these conditions: Dental disease Having surgery or tooth extraction Infection Kidney disease Low levels of calcium  or vitamin D  in the blood Malnutrition On hemodialysis Skin conditions or sensitivity Thyroid  or parathyroid disease An unusual reaction to denosumab , other medications, foods, dyes, or preservatives Pregnant or trying to get pregnant Breast-feeding How should I use this medication? This medication is for injection under the skin. It is given by your care team in a hospital or clinic setting. A special MedGuide will be given to you before each treatment. Be sure to read this information carefully each time. Talk to your care team about the use of this medication in children. While it may be prescribed for children as young as 13 years for selected conditions, precautions do apply. Overdosage: If you think you have taken too much of this medicine contact a poison control center or emergency room at once. NOTE: This medicine is only for you. Do not share this medicine with others. What if I miss a dose? Keep appointments for follow-up doses. It is important not to miss your dose. Call your care team if you are unable to  keep an appointment. What may interact with this medication? Do not take this medication with any of the following: Other medications containing denosumab  This medication may also interact with the following: Medications that lower your chance of fighting infection Steroid medications, such as prednisone  or cortisone This list may not describe all possible interactions. Give your health care provider a list of all the medicines, herbs, non-prescription drugs, or dietary supplements you use. Also tell them if you smoke, drink alcohol, or use illegal drugs. Some items may interact with your medicine. What should I watch for while using this medication? Your condition will be monitored carefully while you are receiving this medication. You may need blood work while taking this medication. This medication may increase your risk of getting an infection. Call your care team for advice if you get a fever, chills, sore throat, or other symptoms of a cold or flu. Do not treat yourself. Try to avoid being around people who are sick. You should make sure you get enough calcium  and vitamin D  while you are taking this medication, unless your care team tells you not to. Discuss the foods you eat and the vitamins you take with your care team. Some people who take this medication have severe bone, joint, or muscle pain. This medication may also increase your risk for jaw problems or a broken thigh bone. Tell your care team right away if you have severe pain in your jaw, bones, joints, or muscles. Tell your care team if you have any pain that does not go away or that gets worse. Talk  to your care team if you may be pregnant. Serious birth defects can occur if you take this medication during pregnancy and for 5 months after the last dose. You will need a negative pregnancy test before starting this medication. Contraception is recommended while taking this medication and for 5 months after the last dose. Your care team  can help you find the option that works for you. What side effects may I notice from receiving this medication? Side effects that you should report to your care team as soon as possible: Allergic reactions--skin rash, itching, hives, swelling of the face, lips, tongue, or throat Bone, joint, or muscle pain Low calcium  level--muscle pain or cramps, confusion, tingling, or numbness in the hands or feet Osteonecrosis of the jaw--pain, swelling, or redness in the mouth, numbness of the jaw, poor healing after dental work, unusual discharge from the mouth, visible bones in the mouth Side effects that usually do not require medical attention (report to your care team if they continue or are bothersome): Cough Diarrhea Fatigue Headache Nausea This list may not describe all possible side effects. Call your doctor for medical advice about side effects. You may report side effects to FDA at 1-800-FDA-1088. Where should I keep my medication? This medication is given in a hospital or clinic. It will not be stored at home. NOTE: This sheet is a summary. It may not cover all possible information. If you have questions about this medicine, talk to your doctor, pharmacist, or health care provider.  2024 Elsevier/Gold Standard (2021-06-05 00:00:00)

## 2023-09-08 NOTE — Progress Notes (Signed)
 [facility]  ONCOLOGY CLINIC PROGRESS NOTE   Patient Care Team: Cleotilde Planas, MD as PCP - General (Family Medicine) Revankar, Jennifer SAUNDERS, MD as PCP - Cardiology (Cardiology) Cindie Ole DASEN, MD as PCP - Electrophysiology (Cardiology) Isadora Hose, MD as Consulting Physician (Pulmonary Disease) Prentis Duwaine BROCKS, RN as Oncology Nurse Navigator  PATIENT NAME: Gregg Guerrero   MR#: [id number] DOB: 1950/10/23  Date of visit: 09/08/2023  ASSESSMENT & PLAN:   Gregg Guerrero is a 73 y.o. 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 No evidence of liver or other visceral involvement. Patient has a history of secondhand smoke exposure. Family history of various cancers.   -Previously I discussed staging, diagnosis, prognosis, plan of care, treatment options with the patient and his wife.  Reviewed NCCN guidelines.  Discussed that all treatment options are palliative in nature and not curative intent, given stage IV disease.  They verbalized understanding.  - Ordered Foundation Medicine testing on the biopsy specimen to identify potential targetable mutations. 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  No other major actionable mutations.  -Given ALK mutation positivity, he would be candidate for targeted therapy.  Plan made to proceed with Lorlatinib  100 mg daily dosing, starting from 01/14/2023.  Side effects include but not limited to: increased blood pressure, edema, changes  in electrolytes, increase in cholesterol and triglycerides, changes in LFTs, peripheral neuropathy, CNS/mood changes, and rare but serious risk for AV block and ILD/pneumonitis.  EKG on 01/13/2023 showed normal sinus rhythm, normal QT interval of 372 ms.  This will be monitored periodically as needed.  -MRI of the brain performed on 01/01/2023 showed no definite evidence of intracranial metastatic disease noted.    -Patient has been compliant with Lorlatinib  and tolerating it very well.  Labs today reveal no dose-limiting toxicities.  Alkaline phosphatase decreased from 316 to 92, indicating a positive response to treatment. Calcium  levels slightly down at 8.3.  -Given persistent hypocalcemia, I previously advised him to increase calcium  supplements to total of 1800 mg daily.  -Restaging PET scan on 03/31/2023 showed resolution of hypermetabolic left upper lobe pulmonary nodule.  Complete resolution of hypermetabolic activity within the skeleton.  No evidence of metastatic lymphadenopathy or visceral metastatic disease.  This is consistent with excellent response.  I previously reviewed the images with the patient, his wife and his family member who were accompanying.  They were understandably very happy.  After his recent trip to [place], patient developed swelling in both legs around ankles.  As per our instructions, he held Lorlatinib  from 05/14/2023 and resumed it from 05/18/2023.    He was started on Lasix  20 mg daily as needed.  Recent restaging PET scan on 07/03/2023 continue to show no evidence of FDG avid disease.  It did show soft tissue lesion behind the right hip, stable with mild FDG activity, likely unrelated to his lung cancer diagnosis.  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  Patient is asymptomatic.  Will continue monitoring alone.  MRI of the brain on 07/20/2023 showed no evidence of intracranial metastatic disease.  Labs today  reveal no dose-limiting toxicities.  Plan is to continue Lorlatinib  100 mg daily.    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  This will be continued every 4 weeks.  Dose given today.  Recently atorvastatin  dose was increased to 20 mg, as triglycerides were elevated from Lorlatinib  use.  Given persistent elevation in triglycerides, we increased atorvastatin  dose to 40 mg daily from July 2025.  Repeat lipid panel is pending from today.  RTC in 4 weeks for labs, follow-up and Xgeva  injection.  Next restaging scan will be obtained approximately 3 months from the last scan and we will obtain CT scans going forward.  Request placed today.  Hypertriglyceridemia Triglyceride levels increased from 205 to 429 as of June 2025, likely due to medication. LDL levels are stable at 111. Current atorvastatin  dose is 20 mg. - Increased atorvastatin  to 40 mg daily from July 2025.  - Monitor triglyceride levels.  Repeat values are pending from today. - Consider additional medication if triglycerides continue to rise - Discussed potential side effects such as muscle pain or cramps  Unintentional weight gain Weight increased to 218 lbs from a previous weight of 214 lbs, with a baseline of 190-195 lbs. - Advise monitoring calorie intake, focusing on healthy eating, and avoiding excess calories from sodas, processed foods, and sweets.   I reviewed lab results and outside records for this visit and discussed relevant results with the patient. Diagnosis, plan of care and treatment options were also discussed in detail with the patient. Opportunity provided to ask questions and answers provided to his apparent satisfaction. Provided instructions to call our clinic with any problems, questions or concerns prior to return visit. I recommended to continue follow-up with PCP and sub-specialists. He verbalized understanding and agreed with the plan.   NCCN guidelines have been consulted in the  planning of this patient's care.  I spent a total of 30 minutes during this encounter with the patient including review of chart and various tests results, discussions about plan of care and coordination of care plan.   Chinita Patten, MD  09/08/2023 1:43 PM  [facility] - A DEPT OF [facility] [address] [name] [address] Dept: [phone] Dept Fax: [phone]    CHIEF COMPLAINT/ REASON FOR VISIT:   Adenocarcinoma of left lung with metastasis to the bones.  Stage IV disease.  ALK mutation positive.  Current Treatment: Lorlatinib  100 mg p.o. daily starting from 01/14/2023.  INTERVAL HISTORY:    Discussed the use of AI scribe software for clinical note transcription with the patient, who gave verbal consent to proceed.   Gregg Guerrero is here today for repeat clinical assessment.  He started taking Lorlatinib  from 01/14/2023.   History of Present Illness Kelin Borum is a 73 year old male who presents for follow-up of lab results and ongoing symptoms.  He has been experiencing tingling and numbness in his toes and the bottoms of his feet, which is a new symptom. There is also swelling in his legs, particularly around the ankles, with more swelling in one leg than the other. He is currently taking Lasix  but does not notice a change in the swelling. He is concerned about the tingling and numbness in his feet, wondering if it could be related to a  previous toenail infection or antibiotic use. No leg weakness, bowel or bladder incontinence, or back pain.  His triglyceride levels were previously high at 429 in June but decreased to 285 in July. He is currently on atorvastatin , which was increased to 40 mg at the last visit. His glucose levels have been stable, with recent readings of 127 and 147. He has gained weight, now at 218 pounds, up from a baseline of 190-195 pounds, which he  attributes to increased appetite from [place] and possibly dietary habits during a recent vacation.  He had an infection in his toenail, which was treated with antibiotics and a procedure to remove part of the nail. The toenail is now growing back.  He is taking Lomotil  and Lasix .   I have reviewed the past medical history, past surgical history, social history and family history with the patient and they are unchanged from previous note.  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 Additionally there were scattered sclerotic bone lesions diffusely along the skeleton, and numerous calcified lymph nodes throughout the mediastinum and left hilum.    12/04/2022 PET scan   PET scan was notable for a 3.9 cm irregular spiculated mass in the left upper lobe, suspicious for bronchogenic carcinoma. 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  Whole body bone scan confirmed widespread osseous metastases. He denies any associated pain.    12/23/2022 Initial Diagnosis   Malignant neoplasm of upper lobe of left lung [facility]):  He has been under the care of his PCP for what was initially thought to be a pneumonia on CXR. Patient developed shortness of breath in September 2024 and was seen by his primary care physician.   At that time a chest x-ray was done which showed an infiltrate and question of atypical pneumonia.  The patient was treated with antibiotics however his symptoms did not improve.  A CT of the chest was ordered to further evaluate etiology for nonresolving dyspnea.   CT chest on 11/11/2022 which showed a spiculated nodule in the lingula with diffuse interstitial thickening in the left upper lobe that was concerning for an aggressive process or malignancy.  This led to PET/CT, pulmonology referral and additional evaluation.  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 Per his wife the melanoma was low grade and did not require any further treatment after excision.    12/23/2022 Procedure   Patient had initial consultation in pulmonology clinic on 12/17/2022 and underwent navigational bronchoscopy, EBUS and biopsy of left upper lobe lung nodule on 12/23/2022, performed by Dr. Malka.  On EBUS, hilar and mediastinal lymph node stations were examined but no lymph nodes were encountered.  This was suspected to be secondary to severe calcification noted on CT scan.  12/23/2022 Pathology Results   Pathology from left upper lobe lung nodule biopsy came back positive for adenocarcinoma.  Brushings from left upper lobe and lavage showed rare atypical cells.   12/30/2022 Cancer Staging   Staging form: Lung, AJCC 8th Edition - Clinical stage from 12/30/2022: Stage IVB (cT2a, cN0, cM1c) - Signed by Autumn Millman, MD on 12/30/2022 Stage prefix: Initial diagnosis    Miscellaneous   Request submitted for Foundation Medicine testing on the specimen and Guardant 360 (liquid biopsy) for any actionable mutations and also for PD-L1 testing.  Treatment decisions to be made pending these results.   01/05/2023 Pathology Results   Foundation One CDx: ALK + EML4-ALK fusion (Variant 1)  PD-L1 TPS 5%  DNMT3A P904L mutation noted (unknown  significance in his tumor type).    01/06/2023 -  Chemotherapy   Patient is on Treatment Plan : LUNG NSCLC Lorlatinib  q28d     03/31/2023 PET scan   IMPRESSION: 1. Resolution of hypermetabolic LEFT upper lobe pulmonary nodule. 2. Complete resolution of hypermetabolic activity within the skeleton. Underlying diffuse sclerotic skeletal disease remains consistent with treated metastasis. 3. No evidence of metastatic adenopathy or visceral metastasis. 4. Intense of focal activity posterior to the RIGHT femoral neck. This localizes to small focus of extra osseous soft tissue. This lesion is hypermetabolic on comparison exam with no interval change. Favor lesion unrelated to lung carcinoma. Indeterminate activity. Consider MRI of the RIGHT hip for further evaluation.   07/03/2023 PET scan   No evidence of FDG avid disease.  Stable soft tissue lesion behind right femoral neck, consider MRI for further evaluation.       REVIEW OF SYSTEMS:   Review of Systems - Oncology  All other pertinent systems were reviewed with the patient and are negative.  ALLERGIES: He has no known allergies.  MEDICATIONS:  Current Outpatient Medications  Medication Sig Dispense Refill   atorvastatin  (LIPITOR) 40 MG tablet Take 1 tablet (40 mg total) by mouth at bedtime. 30 tablet 3   benzonatate (TESSALON) 200 MG capsule Take 200 mg by mouth 3 (three) times daily as needed for cough.     calcium  carbonate (SUPER CALCIUM ) 1500 (600 Ca) MG TABS tablet Take 600 mg of elemental calcium  by mouth 3 (three) times daily with meals.     cetirizine (ZYRTEC) 10 MG tablet 10 mg daily.     Cholecalciferol 125 MCG (5000 UT) capsule Take 5,000 Units by mouth daily.     clonazePAM  (KLONOPIN ) 1 MG tablet Take 1 mg by mouth at bedtime.     clopidogrel  (PLAVIX ) 75 MG tablet Take 75 mg by mouth daily.     Ferrous Sulfate (IRON) 325 (65 FE) MG TABS Take 325 mg by mouth daily.     fluticasone (FLONASE) 50 MCG/ACT nasal spray Place 1  spray into both nostrils daily as needed for allergies.     Ibuprofen 200 MG CAPS Take 1 capsule by mouth 3 (three) times daily as needed (pain).     latanoprost (XALATAN) 0.005 % ophthalmic solution Place 1 drop into both eyes at bedtime.     loratadine  (CLARITIN ) 10 MG tablet Take 10 mg by mouth at bedtime.     lorlatinib  (LORBRENA ) 100 MG tablet Take 1 tablet (100 mg total) by mouth daily. Swallow tablets whole. Do not chew, crush or split tablets. 28 tablet 5   MAGNESIUM PO Take 1 tablet by mouth daily.     Multiple Vitamin (MULTIVITAMIN) tablet Take 1 tablet by mouth daily.  omeprazole  (PRILOSEC) 20 MG capsule Take 20 mg by mouth daily.     ondansetron  (ZOFRAN -ODT) 8 MG disintegrating tablet Take 1 tablet (8 mg total) by mouth every 8 (eight) hours as needed for nausea or vomiting. 60 tablet 3   PREVIDENT 5000 BOOSTER PLUS 1.1 % PSTE Take 1 Application by mouth once as needed (Dry mouth).     traZODone (DESYREL) 50 MG tablet Take 25 mg by mouth at bedtime.     diphenoxylate -atropine  (LOMOTIL ) 2.5-0.025 MG tablet Take 1 tablet by mouth 4 (four) times daily as needed for diarrhea or loose stools. 90 tablet 1   furosemide  (LASIX ) 20 MG tablet Take 1 tablet (20 mg total) by mouth daily as needed for edema. 90 tablet 3   No current facility-administered medications for this visit.     VITALS:   Blood pressure (!) 143/78, pulse 73, temperature 97.7 F (36.5 C), temperature source Oral, resp. rate 18, height 6' 1 (1.854 m), weight 218 lb (98.9 kg), SpO2 95%.  Wt Readings from Last 3 Encounters:  09/08/23 218 lb (98.9 kg)  08/04/23 214 lb (97.1 kg)  07/13/23 214 lb 9.6 oz (97.3 kg)    Body mass index is 28.76 kg/m.   Onc Performance Status - 09/08/23 1148       ECOG Perf Status   ECOG Perf Status Fully active, able to carry on all pre-disease performance without restriction      KPS SCALE   KPS % SCORE Normal, no compliants, no evidence of disease           PHYSICAL EXAM:    Physical Exam Constitutional:      General: He is not in acute distress.    Appearance: Normal appearance.  HENT:     Head: Normocephalic and atraumatic.  Eyes:     General: No scleral icterus.    Conjunctiva/sclera: Conjunctivae normal.  Cardiovascular:     Rate and Rhythm: Normal rate and regular rhythm.     Heart sounds: Normal heart sounds.  Pulmonary:     Effort: Pulmonary effort is normal.     Breath sounds: Normal breath sounds.  Abdominal:     General: There is no distension.  Musculoskeletal:     Right lower leg: Edema (1+) present.     Left lower leg: Edema (1+) present.  Lymphadenopathy:     Cervical: No cervical adenopathy.  Neurological:     General: No focal deficit present.     Mental Status: He is alert and oriented to person, place, and time.  Psychiatric:        Mood and Affect: Mood normal.        Behavior: Behavior normal.        Thought Content: Thought content normal.       LABORATORY DATA:   I have reviewed the data as listed.  Results for orders placed or performed in visit on 09/08/23  Magnesium  Result Value Ref Range   Magnesium 2.0 1.7 - 2.4 mg/dL  CMP (Cancer Center only)  Result Value Ref Range   Sodium 141 135 - 145 mmol/L   Potassium 4.5 3.5 - 5.1 mmol/L   Chloride 108 98 - 111 mmol/L   CO2 21 (L) 22 - 32 mmol/L   Glucose, Bld 127 (H) 70 - 99 mg/dL   BUN 22 8 - 23 mg/dL   Creatinine 8.88 9.38 - 1.24 mg/dL   Calcium  9.7 8.9 - 10.3 mg/dL   Total Protein 6.7 6.5 - 8.1  g/dL   Albumin 4.3 3.5 - 5.0 g/dL   AST 27 15 - 41 U/L   ALT 33 0 - 44 U/L   Alkaline Phosphatase 96 38 - 126 U/L   Total Bilirubin 0.3 0.0 - 1.2 mg/dL   GFR, Estimated >39 >39 mL/min   Anion gap 13 5 - 15  CBC with Differential (Cancer Center Only)  Result Value Ref Range   WBC Count 5.0 4.0 - 10.5 K/uL   RBC 4.74 4.22 - 5.81 MIL/uL   Hemoglobin 14.2 13.0 - 17.0 g/dL   HCT 57.2 60.9 - 47.9 %   MCV 90.1 80.0 - 100.0 fL   MCH 30.0 26.0 - 34.0 pg   MCHC  33.3 30.0 - 36.0 g/dL   RDW 85.6 88.4 - 84.4 %   Platelet Count 149 (L) 150 - 400 K/uL   nRBC 0.0 0.0 - 0.2 %   Neutrophils Relative % 67 %   Neutro Abs 3.4 1.7 - 7.7 K/uL   Lymphocytes Relative 14 %   Lymphs Abs 0.7 0.7 - 4.0 K/uL   Monocytes Relative 12 %   Monocytes Absolute 0.6 0.1 - 1.0 K/uL   Eosinophils Relative 5 %   Eosinophils Absolute 0.2 0.0 - 0.5 K/uL   Basophils Relative 1 %   Basophils Absolute 0.0 0.0 - 0.1 K/uL   Immature Granulocytes 1 %   Abs Immature Granulocytes 0.03 0.00 - 0.07 K/uL       RADIOGRAPHIC STUDIES:  MR Brain W Wo Contrast CLINICAL DATA:  Provided history: Malignant neoplasm of upper lobe of left lung. Stage IV lung cancer. Surveillance.  EXAM: MRI HEAD WITHOUT AND WITH CONTRAST  TECHNIQUE: Multiplanar, multiecho pulse sequences of the brain and surrounding structures were obtained without and with intravenous contrast.  CONTRAST:  10mL GADAVIST  GADOBUTROL  1 MMOL/ML IV SOLN  COMPARISON:  Brain MRI 01/01/2023.  FINDINGS: Brain:  Mild generalized cerebral atrophy.  Tiny chronic cortical infarct within the posterior left frontal lobe (postcentral gyrus) (series 9, image 43).  Chronic lacunar infarcts within the left caudate nucleus and bilateral thalami.  Small chronic infarcts within the bilateral cerebellar hemispheres.  There is no acute infarct.  No evidence of an intracranial mass.  No chronic intracranial blood products.  No extra-axial fluid collection.  No midline shift.  No pathologic intracranial enhancement identified. The 2 mm focus of enhancement demonstrated within the left parietal lobe periventricular white matter on the brain MRI of 01/01/2023 is not present on today's examination.  Vascular: Maintained flow voids within the proximal large arterial vessels.  Skull and upper cervical spine: Right parietal burr hole. Known widespread sclerotic osseous metastatic disease.  Sinuses/Orbits: No mass or  acute finding within the imaged orbits. No significant paranasal sinus disease.  Other: Trace fluid within right mastoid air cells.  IMPRESSION: 1. No evidence of intracranial metastatic disease. The previously demonstrated 2 mm focus of enhancement in the left parietal lobe periventricular white matter is not present on today's study. 2. 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 3. Mild generalized cerebral atrophy. 4. Known widespread sclerotic osseous metastatic disease.  Electronically Signed   By: Rockey Childs D.O.   On: 07/30/2023 12:05    CODE STATUS:  Code Status History     Date Active Date Inactive Code Status Order ID Comments User Context   07/20/2014 0933 07/21/2014 1200 Full Code [id number]  Louis Shove, MD Inpatient      Advance  Directive Documentation    Flowsheet Row Most Recent Value  Type of Advance Directive Healthcare Power of Attorney, Living will  Pre-existing out of facility DNR order (yellow form or pink MOST form) --  MOST Form in Place? --       Orders Placed This Encounter  Procedures   CT CHEST ABDOMEN PELVIS W CONTRAST    Standing Status:   Future    Expected Date:   09/30/2023    Expiration Date:   09/07/2024    If indicated for the ordered procedure, I authorize the administration of contrast media per Radiology protocol:   Yes    Does the patient have a contrast media/X-ray dye allergy?:   No    Preferred imaging location?:   [facility]    If indicated for the ordered procedure, I authorize the administration of oral contrast media per Radiology protocol:   Yes   CBC with Differential (Cancer Center Only)    Standing Status:   Standing    Number of Occurrences:   6    Expiration Date:   09/07/2024   CMP (Cancer Center only)    Standing Status:   Standing    Number of Occurrences:   6    Expiration Date:   09/07/2024   Magnesium    Standing  Status:   Standing    Number of Occurrences:   6    Expiration Date:   09/07/2024   Lipid panel    Standing Status:   Standing    Number of Occurrences:   6    Expiration Date:   09/07/2024     This document was completed utilizing speech recognition software. Grammatical errors, random word insertions, pronoun errors, and incomplete sentences are an occasional consequence of this system due to software limitations, ambient noise, and hardware issues. Any formal questions or concerns about the content, text or information contained within the body of this dictation should be directly addressed to the provider for clarification.

## 2023-09-08 NOTE — Assessment & Plan Note (Signed)
 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 No evidence of liver or other visceral involvement. Patient has a history of secondhand smoke exposure. Family history of various cancers.   -Previously I discussed staging, diagnosis, prognosis, plan of care, treatment options with the patient and his wife.  Reviewed NCCN guidelines.  Discussed that all treatment options are palliative in nature and not curative intent, given stage IV disease.  They verbalized understanding.  - Ordered [facility] testing on the biopsy specimen to identify potential targetable mutations. 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  No other major actionable mutations.  -Given ALK mutation positivity, he would be candidate for targeted therapy.  Plan made to proceed with Lorlatinib  100 mg daily dosing, starting from 01/14/2023.  Side effects include but not limited to: increased blood pressure, edema, changes in electrolytes, increase in cholesterol and triglycerides, changes in LFTs, peripheral neuropathy, CNS/mood changes, and rare but serious risk for AV block and ILD/pneumonitis.  EKG on 01/13/2023 showed normal sinus rhythm, normal QT interval of 372 ms.  This will be monitored periodically as needed.  -MRI of the brain performed on 01/01/2023 showed no definite evidence of intracranial metastatic disease noted.    -Patient has been compliant with Lorlatinib  and tolerating it very well.  Labs today reveal no dose-limiting toxicities.  Alkaline phosphatase decreased from 316 to 92, indicating a positive response to treatment. Calcium  levels slightly down at 8.3.  -Given persistent hypocalcemia, I previously advised him to increase calcium  supplements to total of 1800 mg daily.  -Restaging PET scan on 03/31/2023 showed resolution of hypermetabolic left upper lobe  pulmonary nodule.  Complete resolution of hypermetabolic activity within the skeleton.  No evidence of metastatic lymphadenopathy or visceral metastatic disease.  This is consistent with excellent response.  I previously reviewed the images with the patient, his wife and his family member who were accompanying.  They were understandably very happy.  After his recent trip to [place], patient developed swelling in both legs around ankles.  As per our instructions, he held Lorlatinib  from 05/14/2023 and resumed it from 05/18/2023.    He was started on Lasix  20 mg daily as needed.  Recent restaging PET scan on 07/03/2023 continue to show no evidence of FDG avid disease.  It did show soft tissue lesion behind the right hip, stable with mild FDG activity, likely unrelated to his lung cancer diagnosis.  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  Patient is asymptomatic.  Will continue monitoring alone.  MRI of the brain on 07/20/2023 showed no evidence of intracranial metastatic disease.  Labs today reveal no dose-limiting toxicities.  Plan is to continue Lorlatinib  100 mg daily.    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  This will be continued every 4 weeks.  Dose given today.  Recently atorvastatin  dose was increased to 20 mg, as triglycerides were elevated from Lorlatinib  use.  Given persistent elevation in triglycerides, we increased atorvastatin  dose to 40 mg daily from July 2025.  Repeat lipid panel is pending from today.  RTC in 4 weeks for labs, follow-up and Xgeva  injection.  Next restaging scan will be obtained approximately 3 months from the last scan and we will obtain CT scans going forward.  Request placed today.

## 2023-09-09 ENCOUNTER — Other Ambulatory Visit: Payer: Self-pay

## 2023-09-09 ENCOUNTER — Other Ambulatory Visit (HOSPITAL_COMMUNITY): Payer: Self-pay

## 2023-09-16 ENCOUNTER — Other Ambulatory Visit: Payer: Self-pay

## 2023-09-24 ENCOUNTER — Other Ambulatory Visit: Payer: Self-pay

## 2023-09-25 ENCOUNTER — Ambulatory Visit (HOSPITAL_BASED_OUTPATIENT_CLINIC_OR_DEPARTMENT_OTHER)
Admission: RE | Admit: 2023-09-25 | Discharge: 2023-09-25 | Disposition: A | Discharge status: Home / Self Care | Source: Ambulatory Visit | Attending: Oncology | Admitting: Oncology

## 2023-09-25 DIAGNOSIS — C3412 Malignant neoplasm of upper lobe, left bronchus or lung: Secondary | ICD-10-CM | POA: Insufficient documentation

## 2023-09-25 MED ORDER — IOHEXOL 300 MG/ML  SOLN
100.0000 mL | Freq: Once | INTRAMUSCULAR | Status: AC | PRN
  Administered 2023-09-25: 100 mL via INTRAVENOUS

## 2023-10-02 ENCOUNTER — Other Ambulatory Visit: Payer: Self-pay | Admitting: Pharmacy Technician

## 2023-10-02 ENCOUNTER — Other Ambulatory Visit: Payer: Self-pay | Admitting: Oncology

## 2023-10-02 ENCOUNTER — Encounter (INDEPENDENT_AMBULATORY_CARE_PROVIDER_SITE_OTHER): Payer: Self-pay

## 2023-10-02 ENCOUNTER — Other Ambulatory Visit: Payer: Self-pay

## 2023-10-02 DIAGNOSIS — C3412 Malignant neoplasm of upper lobe, left bronchus or lung: Secondary | ICD-10-CM

## 2023-10-02 NOTE — Progress Notes (Signed)
 Specialty Pharmacy Refill Coordination Note  Gregg Guerrero is a 73 y.o. male contacted today regarding refills of specialty medication(s)  Lorlatinib  (LORBRENA )      Patient requested (Patient-Rptd) Delivery   Delivery date: 10/09/23 Verified address: (Patient-Rptd) [address], [name], [name]   Medication will be filled on 10/08/23.   RR sent to MD

## 2023-10-06 ENCOUNTER — Inpatient Hospital Stay

## 2023-10-06 ENCOUNTER — Encounter: Payer: Self-pay | Admitting: Oncology

## 2023-10-06 ENCOUNTER — Inpatient Hospital Stay: Attending: Oncology | Admitting: Oncology

## 2023-10-06 VITALS — BP 132/75 | HR 71 | Temp 97.8°F | Resp 18 | Ht 73.0 in | Wt 217.3 lb

## 2023-10-06 DIAGNOSIS — D367 Benign neoplasm of other specified sites: Secondary | ICD-10-CM | POA: Diagnosis not present

## 2023-10-06 DIAGNOSIS — E781 Pure hyperglyceridemia: Secondary | ICD-10-CM | POA: Insufficient documentation

## 2023-10-06 DIAGNOSIS — C3412 Malignant neoplasm of upper lobe, left bronchus or lung: Secondary | ICD-10-CM | POA: Insufficient documentation

## 2023-10-06 DIAGNOSIS — Z79899 Other long term (current) drug therapy: Secondary | ICD-10-CM | POA: Diagnosis not present

## 2023-10-06 DIAGNOSIS — Z86006 Personal history of melanoma in-situ: Secondary | ICD-10-CM | POA: Diagnosis not present

## 2023-10-06 DIAGNOSIS — C7951 Secondary malignant neoplasm of bone: Secondary | ICD-10-CM

## 2023-10-06 DIAGNOSIS — Z7722 Contact with and (suspected) exposure to environmental tobacco smoke (acute) (chronic): Secondary | ICD-10-CM | POA: Insufficient documentation

## 2023-10-06 DIAGNOSIS — Z9221 Personal history of antineoplastic chemotherapy: Secondary | ICD-10-CM | POA: Insufficient documentation

## 2023-10-06 LAB — LIPID PANEL
Cholesterol: 170 mg/dL (ref 0–200)
HDL: 31 mg/dL — ABNORMAL LOW (ref >40)
LDL Cholesterol: 92 mg/dL (ref 0–99)
Total CHOL/HDL Ratio: 5.5 ratio
Triglycerides: 233 mg/dL — ABNORMAL HIGH (ref <150)
VLDL: 47 mg/dL — ABNORMAL HIGH (ref 0–40)

## 2023-10-06 LAB — CBC WITH DIFFERENTIAL (CANCER CENTER ONLY)
Abs Immature Granulocytes: 0.05 K/uL (ref 0.00–0.07)
Basophils Absolute: 0 K/uL (ref 0.0–0.1)
Basophils Relative: 0 %
Eosinophils Absolute: 0.3 K/uL (ref 0.0–0.5)
Eosinophils Relative: 5 %
HCT: 43.1 % (ref 39.0–52.0)
Hemoglobin: 14.3 g/dL (ref 13.0–17.0)
Immature Granulocytes: 1 %
Lymphocytes Relative: 12 %
Lymphs Abs: 0.7 K/uL (ref 0.7–4.0)
MCH: 29.5 pg (ref 26.0–34.0)
MCHC: 33.2 g/dL (ref 30.0–36.0)
MCV: 88.9 fL (ref 80.0–100.0)
Monocytes Absolute: 0.6 K/uL (ref 0.1–1.0)
Monocytes Relative: 12 %
Neutro Abs: 3.7 K/uL (ref 1.7–7.7)
Neutrophils Relative %: 70 %
Platelet Count: 142 K/uL — ABNORMAL LOW (ref 150–400)
RBC: 4.85 MIL/uL (ref 4.22–5.81)
RDW: 14.1 % (ref 11.5–15.5)
WBC Count: 5.3 K/uL (ref 4.0–10.5)
nRBC: 0 % (ref 0.0–0.2)

## 2023-10-06 LAB — CMP (CANCER CENTER ONLY)
ALT: 27 U/L (ref 0–44)
AST: 26 U/L (ref 15–41)
Albumin: 4.4 g/dL (ref 3.5–5.0)
Alkaline Phosphatase: 76 U/L (ref 38–126)
Anion gap: 12 (ref 5–15)
BUN: 21 mg/dL (ref 8–23)
CO2: 23 mmol/L (ref 22–32)
Calcium: 9.7 mg/dL (ref 8.9–10.3)
Chloride: 106 mmol/L (ref 98–111)
Creatinine: 1.08 mg/dL (ref 0.61–1.24)
GFR, Estimated: >60 mL/min (ref >60)
Glucose, Bld: 130 mg/dL — ABNORMAL HIGH (ref 70–99)
Potassium: 4.8 mmol/L (ref 3.5–5.1)
Sodium: 142 mmol/L (ref 135–145)
Total Bilirubin: 0.4 mg/dL (ref 0.0–1.2)
Total Protein: 7 g/dL (ref 6.5–8.1)

## 2023-10-06 LAB — MAGNESIUM: Magnesium: 2.1 mg/dL (ref 1.7–2.4)

## 2023-10-06 MED ORDER — DENOSUMAB 120 MG/1.7ML ~~LOC~~ SOLN
120.0000 mg | Freq: Once | SUBCUTANEOUS | Status: AC
  Administered 2023-10-06: 120 mg via SUBCUTANEOUS
  Filled 2023-10-06: qty 1.7

## 2023-10-06 NOTE — Assessment & Plan Note (Addendum)
 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 No evidence of liver or other visceral involvement. Patient has a history of secondhand smoke exposure. Family history of various cancers.   -Previously I discussed staging, diagnosis, prognosis, plan of care, treatment options with the patient and his wife.  Reviewed NCCN guidelines.  Discussed that all treatment options are palliative in nature and not curative intent, given stage IV disease.  They verbalized understanding.  - Ordered [facility] Medicine testing on the biopsy specimen to identify potential targetable mutations. 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  No other major actionable mutations.  -Given ALK mutation positivity, he would be candidate for targeted therapy.  Plan made to proceed with Lorlatinib  100 mg daily dosing, starting from 01/14/2023.  Side effects include but not limited to: increased blood pressure, edema, changes in electrolytes, increase in cholesterol and triglycerides, changes in LFTs, peripheral neuropathy, CNS/mood changes, and rare but serious risk for AV block and ILD/pneumonitis.  EKG on 01/13/2023 showed normal sinus rhythm, normal QT interval of 372 ms.  This will be monitored periodically as needed.  -MRI of the brain performed on 01/01/2023 showed no definite evidence of intracranial metastatic disease noted.    -Patient has been compliant with Lorlatinib  and tolerating it very well.  Labs today reveal no dose-limiting toxicities.  Alkaline phosphatase decreased from 316 to 92, indicating a positive response to treatment. Calcium  levels slightly down at 8.3.  -Given persistent hypocalcemia, I previously advised him to increase calcium  supplements to total of 1800 mg daily.  -Restaging PET scan on 03/31/2023 showed resolution of hypermetabolic left upper lobe  pulmonary nodule.  Complete resolution of hypermetabolic activity within the skeleton.  No evidence of metastatic lymphadenopathy or visceral metastatic disease.  This is consistent with excellent response.  I previously reviewed the images with the patient, his wife and his family member who were accompanying.  They were understandably very happy.  After his recent trip to [place], patient developed swelling in both legs around ankles.  As per our instructions, he held Lorlatinib  from 05/14/2023 and resumed it from 05/18/2023.    He was started on Lasix  20 mg daily as needed.  Recent restaging PET scan on 07/03/2023 continue to show no evidence of FDG avid disease.  It did show soft tissue lesion behind the right hip, stable with mild FDG activity, likely unrelated to his lung cancer diagnosis.  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  Patient is asymptomatic.  Will continue monitoring alone.  MRI of the brain on 07/20/2023 showed no evidence of intracranial metastatic disease.  Recent restaging CT chest, abdomen and pelvis on 09/25/2023 showed stable disease without evidence of disease progression.  Plan to continue current management.  Labs today reveal no dose-limiting toxicities.  Plan is to continue Lorlatinib  100 mg daily.    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  This will be continued every 4 weeks.  Dose given today.  Given persistent elevation in triglycerides, we increased atorvastatin  dose to 40 mg daily from July 2025.  Repeat lipid panel is pending from today.  RTC in 5 weeks for labs, follow-up and Xgeva  injection.  Next restaging scan will be obtained approximately 3 months from the last scan and we will obtain CT scans going forward.  Request will be placed on future visits.

## 2023-10-06 NOTE — Assessment & Plan Note (Addendum)
 Triglyceride levels increased from 205 to 429 as of June 2025, likely due to medication. LDL levels are stable at 111.  - Increased atorvastatin  to 40 mg daily from July 2025.   -Labs today showed improved triglyceride level of 233.  Continue atorvastatin  at current dosing. - Monitor triglyceride levels.  - Consider additional medication if triglycerides continue to rise - Discussed potential side effects such as muscle pain or cramps

## 2023-10-06 NOTE — Progress Notes (Signed)
 [facility]  ONCOLOGY CLINIC PROGRESS NOTE   Patient Care Team: Cleotilde Planas, MD as PCP - General (Family Medicine) Revankar, Jennifer SAUNDERS, MD as PCP - Cardiology (Cardiology) Cindie Ole DASEN, MD as PCP - Electrophysiology (Cardiology) Isadora Hose, MD as Consulting Physician (Pulmonary Disease) Prentis Duwaine BROCKS, RN as Oncology Nurse Navigator  PATIENT NAME: Gregg Guerrero   MR#: [id number] DOB: [birth year]  Date of visit: 10/06/2023  ASSESSMENT & PLAN:   Gregg Guerrero is a 73 y.o. 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 No evidence of liver or other visceral involvement. Patient has a history of secondhand smoke exposure. Family history of various cancers.   -Previously I discussed staging, diagnosis, prognosis, plan of care, treatment options with the patient and his wife.  Reviewed NCCN guidelines.  Discussed that all treatment options are palliative in nature and not curative intent, given stage IV disease.  They verbalized understanding.  - Ordered Foundation Medicine testing on the biopsy specimen to identify potential targetable mutations. 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  No other major actionable mutations.  -Given ALK mutation positivity, he would be candidate for targeted therapy.  Plan made to proceed with Lorlatinib  100 mg daily dosing, starting from 01/14/2023.  Side effects include but not limited to: increased blood pressure, edema, changes  in electrolytes, increase in cholesterol and triglycerides, changes in LFTs, peripheral neuropathy, CNS/mood changes, and rare but serious risk for AV block and ILD/pneumonitis.  EKG on 01/13/2023 showed normal sinus rhythm, normal QT interval of 372 ms.  This will be monitored periodically as needed.  -MRI of the brain performed on 01/01/2023 showed no definite evidence of intracranial metastatic disease noted.    -Patient has been compliant with Lorlatinib  and tolerating it very well.  Labs today reveal no dose-limiting toxicities.  Alkaline phosphatase decreased from 316 to 92, indicating a positive response to treatment. Calcium  levels slightly down at 8.3.  -Given persistent hypocalcemia, I previously advised him to increase calcium  supplements to total of 1800 mg daily.  -Restaging PET scan on 03/31/2023 showed resolution of hypermetabolic left upper lobe pulmonary nodule.  Complete resolution of hypermetabolic activity within the skeleton.  No evidence of metastatic lymphadenopathy or visceral metastatic disease.  This is consistent with excellent response.  I previously reviewed the images with the patient, his wife and his family member who were accompanying.  They were understandably very happy.  After his recent trip to [place], patient developed swelling in both legs around ankles.  As per our instructions, he held Lorlatinib  from 05/14/2023 and resumed it from 05/18/2023.    He was started on Lasix  20 mg daily as needed.  Recent restaging PET scan on 07/03/2023 continue to show no evidence of FDG avid disease.  It did show soft tissue lesion behind the right hip, stable with mild FDG activity, likely unrelated to his lung cancer diagnosis.  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  Patient is asymptomatic.  Will continue monitoring alone.  MRI of the brain on 07/20/2023 showed no evidence of intracranial metastatic disease.  Recent  restaging CT chest, abdomen and pelvis on 09/25/2023 showed stable disease without evidence of disease progression.  Plan to continue current management.  Labs today reveal no dose-limiting toxicities.  Plan is to continue Lorlatinib  100 mg daily.    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  This will be continued every 4 weeks.  Dose given today.  Given persistent elevation in triglycerides, we increased atorvastatin  dose to 40 mg daily from July 2025.  Repeat lipid panel is pending from today.  RTC in 5 weeks for labs, follow-up and Xgeva  injection.  Next restaging scan will be obtained approximately 3 months from the last scan and we will obtain CT scans going forward.  Request will be placed on future visits.  Hypertriglyceridemia Triglyceride levels increased from 205 to 429 as of June 2025, likely due to medication. LDL levels are stable at 111.  - Increased atorvastatin  to 40 mg daily from July 2025.   -Labs today showed improved triglyceride level of 233.  Continue atorvastatin  at current dosing. - Monitor triglyceride levels.  - Consider additional medication if triglycerides continue to rise - Discussed potential side effects such as muscle pain or cramps  Benign neoplasm of right hip (stable) The right hip tumor remains stable and benign. No further investigation is needed unless changes occur. - Monitor for changes  I reviewed lab results and outside records for this visit and discussed relevant results with the patient. Diagnosis, plan of care and treatment options were also discussed in detail with the patient. Opportunity provided to ask questions and answers provided to his apparent satisfaction. Provided instructions to call our clinic with any problems, questions or concerns prior to return visit. I recommended to continue follow-up with PCP and sub-specialists. He verbalized understanding and agreed with the plan.   NCCN guidelines have been  consulted in the planning of this patient's care.  I spent a total of 30 minutes during this encounter with the patient including review of chart and various tests results, discussions about plan of care and coordination of care plan.   Chinita Patten, MD  10/06/2023 4:00 PM  [facility] - A DEPT OF [facility] [address] [name] [address] Dept: [phone] Dept Fax: [phone]    CHIEF COMPLAINT/ REASON FOR VISIT:   Adenocarcinoma of left lung with metastasis to the bones.  Stage IV disease.  ALK mutation positive.  Current Treatment: Lorlatinib  100 mg p.o. daily starting from 01/14/2023.  INTERVAL HISTORY:    Discussed the use of AI scribe software for clinical note transcription with the patient, who gave verbal consent to proceed.   Gregg Guerrero is here today for repeat clinical assessment.  He started taking Lorlatinib  from 01/14/2023.   History of Present Illness  Gregg Guerrero is a 73 year old male with lung cancer who presents for follow-up on his treatment response and current symptoms.  He has been undergoing treatment for lung cancer, specifically targeting a lesion in the left upper lobe of his lung. Initially measuring 4.5 centimeters, the lesion has now reduced to 9 millimeters. He has no new symptoms.  He has bone lesions and receives injections as part of his treatment. No new bone pain or issues are reported.  A benign tumor in his right hip has not  changed in size.  He experiences neuropathy in his feet and hands, described as tingling and numbness. He previously tried gabapentin  for nerve pain but discontinued it due to drowsiness. He currently manages the symptoms without medication, noting that the numbness is not overly bothersome.  He takes atorvastatin  40 mg for cholesterol management. His cholesterol levels were slightly better at the last check.  He  takes Lasix  for spleen management, which he feels may not be significantly effective, but he continues to use it. He also takes a multivitamin and trazodone, which helps with sleep.  No new headaches or vision problems. His weight is stable, and he reports no significant changes in his overall health. He is planning a trip to [place] next month.   I have reviewed the past medical history, past surgical history, social history and family history with the patient and they are unchanged from previous note.  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 Additionally there were scattered sclerotic bone lesions diffusely along the skeleton, and numerous calcified lymph nodes throughout the mediastinum and left hilum.    12/04/2022 PET scan   PET scan was notable for a 3.9 cm irregular spiculated mass in the left upper lobe, suspicious for bronchogenic carcinoma. 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  Whole body bone scan confirmed widespread osseous metastases. He denies any associated pain.    12/23/2022 Initial Diagnosis   Malignant neoplasm of upper lobe of left lung [facility]):  He has been under the care of his PCP for what was initially thought to be a pneumonia on CXR. Patient developed shortness of  breath in September 2024 and was seen by his primary care physician.  At that time a chest x-ray was done which showed an infiltrate and question of atypical pneumonia.  The patient was treated with antibiotics however his symptoms did not improve.  A CT of the chest was ordered to further evaluate etiology for nonresolving dyspnea.   CT chest on 11/11/2022 which showed a spiculated nodule in the lingula with diffuse interstitial thickening in the left upper lobe that was concerning for an aggressive process or malignancy.  This led to PET/CT, pulmonology referral and additional evaluation.  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 Per his wife the melanoma was low grade and did not require any further treatment after excision.    12/23/2022 Procedure   Patient had initial consultation in pulmonology clinic on 12/17/2022 and underwent navigational bronchoscopy, EBUS and biopsy of left upper lobe lung nodule on 12/23/2022, performed by Dr. Malka.  On EBUS, hilar and mediastinal lymph node stations were examined but no lymph nodes were encountered.  This was suspected to be secondary to severe calcification noted on CT scan.   12/23/2022  Pathology Results   Pathology from left upper lobe lung nodule biopsy came back positive for adenocarcinoma.  Brushings from left upper lobe and lavage showed rare atypical cells.   12/30/2022 Cancer Staging   Staging form: Lung, AJCC 8th Edition - Clinical stage from 12/30/2022: Stage IVB (cT2a, cN0, cM1c) - Signed by Autumn Millman, MD on 12/30/2022 Stage prefix: Initial diagnosis    Miscellaneous   Request submitted for Foundation Medicine testing on the specimen and Guardant 360 (liquid biopsy) for any actionable mutations and also for PD-L1 testing.  Treatment decisions to be made pending these results.   01/05/2023 Pathology Results   Foundation One CDx: ALK + EML4-ALK fusion  (Variant 1)  PD-L1 TPS 5%  DNMT3A P904L mutation noted (unknown significance in his tumor type).    01/06/2023 -  Chemotherapy   Patient is on Treatment Plan : LUNG NSCLC Lorlatinib  q28d     03/31/2023 PET scan   IMPRESSION: 1. Resolution of hypermetabolic LEFT upper lobe pulmonary nodule. 2. Complete resolution of hypermetabolic activity within the skeleton. Underlying diffuse sclerotic skeletal disease remains consistent with treated metastasis. 3. No evidence of metastatic adenopathy or visceral metastasis. 4. Intense of focal activity posterior to the RIGHT femoral neck. This localizes to small focus of extra osseous soft tissue. This lesion is hypermetabolic on comparison exam with no interval change. Favor lesion unrelated to lung carcinoma. Indeterminate activity. Consider MRI of the RIGHT hip for further evaluation.   07/03/2023 PET scan   No evidence of FDG avid disease.  Stable soft tissue lesion behind right femoral neck, consider MRI for further evaluation.       REVIEW OF SYSTEMS:   Review of Systems - Oncology  All other pertinent systems were reviewed with the patient and are negative.  ALLERGIES: He has no known allergies.  MEDICATIONS:  Current Outpatient Medications  Medication Sig Dispense Refill   atorvastatin  (LIPITOR) 40 MG tablet Take 1 tablet (40 mg total) by mouth at bedtime. 30 tablet 3   benzonatate (TESSALON) 200 MG capsule Take 200 mg by mouth 3 (three) times daily as needed for cough.     calcium  carbonate (SUPER CALCIUM ) 1500 (600 Ca) MG TABS tablet Take 600 mg of elemental calcium  by mouth 3 (three) times daily with meals.     cetirizine (ZYRTEC) 10 MG tablet 10 mg daily.     Cholecalciferol 125 MCG (5000 UT) capsule Take 5,000 Units by mouth daily.     clonazePAM  (KLONOPIN ) 1 MG tablet Take 1 mg by mouth at bedtime.     clopidogrel  (PLAVIX ) 75 MG tablet Take 75 mg by mouth daily.     diphenoxylate -atropine  (LOMOTIL ) 2.5-0.025 MG tablet Take 1  tablet by mouth 4 (four) times daily as needed for diarrhea or loose stools. 90 tablet 1   Ferrous Sulfate (IRON) 325 (65 FE) MG TABS Take 325 mg by mouth daily.     fluticasone (FLONASE) 50 MCG/ACT nasal spray Place 1 spray into both nostrils daily as needed for allergies.     furosemide  (LASIX ) 20 MG tablet Take 1 tablet (20 mg total) by mouth daily as needed for edema. 90 tablet 3   Ibuprofen 200 MG CAPS Take 1 capsule by mouth 3 (three) times daily as needed (pain).     latanoprost (XALATAN) 0.005 % ophthalmic solution Place 1 drop into both eyes at bedtime.     lorlatinib  (LORBRENA ) 100 MG tablet Take 1 tablet (100 mg total) by mouth daily. Swallow tablets whole. Do not  chew, crush or split tablets. 28 tablet 5   MAGNESIUM PO Take 1 tablet by mouth daily.     Multiple Vitamin (MULTIVITAMIN) tablet Take 1 tablet by mouth daily.     omeprazole  (PRILOSEC) 20 MG capsule Take 20 mg by mouth daily.     ondansetron  (ZOFRAN -ODT) 8 MG disintegrating tablet Take 1 tablet (8 mg total) by mouth every 8 (eight) hours as needed for nausea or vomiting. 60 tablet 3   PREVIDENT 5000 BOOSTER PLUS 1.1 % PSTE Take 1 Application by mouth once as needed (Dry mouth).     traZODone (DESYREL) 50 MG tablet Take 25 mg by mouth at bedtime.     No current facility-administered medications for this visit.     VITALS:   Blood pressure 132/75, pulse 71, temperature 97.8 F (36.6 C), temperature source Temporal, resp. rate 18, height 6' 1 (1.854 m), weight 217 lb 4.8 oz (98.6 kg), SpO2 97%.  Wt Readings from Last 3 Encounters:  10/06/23 217 lb 4.8 oz (98.6 kg)  09/08/23 218 lb (98.9 kg)  08/04/23 214 lb (97.1 kg)    Body mass index is 28.67 kg/m.   Onc Performance Status - 10/06/23 1153       ECOG Perf Status   ECOG Perf Status Fully active, able to carry on all pre-disease performance without restriction      KPS SCALE   KPS % SCORE Normal, no compliants, no evidence of disease           PHYSICAL  EXAM:   Physical Exam Constitutional:      General: He is not in acute distress.    Appearance: Normal appearance.  HENT:     Head: Normocephalic and atraumatic.  Eyes:     General: No scleral icterus.    Conjunctiva/sclera: Conjunctivae normal.  Cardiovascular:     Rate and Rhythm: Normal rate and regular rhythm.     Heart sounds: Normal heart sounds.  Pulmonary:     Effort: Pulmonary effort is normal.     Breath sounds: Normal breath sounds.  Abdominal:     General: There is no distension.  Musculoskeletal:     Right lower leg: Edema (1+) present.     Left lower leg: Edema (1+) present.  Lymphadenopathy:     Cervical: No cervical adenopathy.  Neurological:     General: No focal deficit present.     Mental Status: He is alert and oriented to person, place, and time.  Psychiatric:        Mood and Affect: Mood normal.        Behavior: Behavior normal.        Thought Content: Thought content normal.       LABORATORY DATA:   I have reviewed the data as listed.  Results for orders placed or performed in visit on 10/06/23  Lipid panel  Result Value Ref Range   Cholesterol 170 0 - 200 mg/dL   Triglycerides 766 (H) <150 mg/dL   HDL 31 (L) >59 mg/dL   Total CHOL/HDL Ratio 5.5 RATIO   VLDL 47 (H) 0 - 40 mg/dL   LDL Cholesterol 92 0 - 99 mg/dL  Magnesium  Result Value Ref Range   Magnesium 2.1 1.7 - 2.4 mg/dL  CMP (Cancer Center only)  Result Value Ref Range   Sodium 142 135 - 145 mmol/L   Potassium 4.8 3.5 - 5.1 mmol/L   Chloride 106 98 - 111 mmol/L   CO2 23 22 - 32 mmol/L  Glucose, Bld 130 (H) 70 - 99 mg/dL   BUN 21 8 - 23 mg/dL   Creatinine 8.91 9.38 - 1.24 mg/dL   Calcium  9.7 8.9 - 10.3 mg/dL   Total Protein 7.0 6.5 - 8.1 g/dL   Albumin 4.4 3.5 - 5.0 g/dL   AST 26 15 - 41 U/L   ALT 27 0 - 44 U/L   Alkaline Phosphatase 76 38 - 126 U/L   Total Bilirubin 0.4 0.0 - 1.2 mg/dL   GFR, Estimated >39 >39 mL/min   Anion gap 12 5 - 15  CBC with Differential  (Cancer Center Only)  Result Value Ref Range   WBC Count 5.3 4.0 - 10.5 K/uL   RBC 4.85 4.22 - 5.81 MIL/uL   Hemoglobin 14.3 13.0 - 17.0 g/dL   HCT 56.8 60.9 - 47.9 %   MCV 88.9 80.0 - 100.0 fL   MCH 29.5 26.0 - 34.0 pg   MCHC 33.2 30.0 - 36.0 g/dL   RDW 85.8 88.4 - 84.4 %   Platelet Count 142 (L) 150 - 400 K/uL   nRBC 0.0 0.0 - 0.2 %   Neutrophils Relative % 70 %   Neutro Abs 3.7 1.7 - 7.7 K/uL   Lymphocytes Relative 12 %   Lymphs Abs 0.7 0.7 - 4.0 K/uL   Monocytes Relative 12 %   Monocytes Absolute 0.6 0.1 - 1.0 K/uL   Eosinophils Relative 5 %   Eosinophils Absolute 0.3 0.0 - 0.5 K/uL   Basophils Relative 0 %   Basophils Absolute 0.0 0.0 - 0.1 K/uL   Immature Granulocytes 1 %   Abs Immature Granulocytes 0.05 0.00 - 0.07 K/uL       RADIOGRAPHIC STUDIES:  CT CHEST ABDOMEN PELVIS W CONTRAST CLINICAL DATA:  Follow-up stage IV lung cancer. * Tracking Code: BO *  EXAM: CT CHEST, ABDOMEN, AND PELVIS WITH CONTRAST  TECHNIQUE: Multidetector CT imaging of the chest, abdomen and pelvis was performed following the standard protocol during bolus administration of intravenous contrast.  RADIATION DOSE REDUCTION: This exam was performed according to the departmental dose-optimization program which includes automated exposure control, adjustment of the mA and/or kV according to patient size and/or use of iterative reconstruction technique.  CONTRAST:  OMNIPAQUE  IOHEXOL  300 MG/ML  SOLN  COMPARISON:  Multiple priors including PET-CT July 03, 2023.  FINDINGS: CT CHEST FINDINGS  Cardiovascular: Normal caliber thoracic aorta. Normal size heart. No significant pericardial effusion/thickening.  Mediastinum/Nodes: No suspicious thyroid  nodule. Calcified mediastinal and left hilar lymph nodes. No pathologically enlarged mediastinal, hilar or axillary lymph nodes.  Lungs/Pleura: Biapical pleuroparenchymal scarring. Scattered atelectasis/scarring.  Spiculated left upper  lobe pulmonary nodule measures 9 mm on image 72/4, previously measuring 8 mm on PET-CT July 03, 2023 without hypermetabolism on that examination, on CT December 18, 2022 this measured 4.5 x 2.3 cm, findings compatible with treated pulmonary neoplasm.  No new suspicious pulmonary nodules or masses.  Musculoskeletal: Extensive sclerotic osseous metastasis involving the axial and appendicular skeleton are stable from prior. No new suspicious osseous lesion identified.  CT ABDOMEN PELVIS FINDINGS  Hepatobiliary: No suspicious hepatic lesion. Gallbladder is unremarkable. No biliary ductal dilation.  Pancreas: Fatty pancreatic atrophy.  Spleen: No splenomegaly.  Adrenals/Urinary Tract: No suspicious adrenal nodule/mass. No hydronephrosis. Bilateral renal cysts and renal lesions technically too small to accurately characterize are stable.  Stomach/Bowel: Stomach is within normal limits. No evidence of bowel wall thickening, distention, or inflammatory changes.  Vascular/Lymphatic: Aortic atherosclerosis. No pathologically enlarged abdominal or pelvic  lymph nodes.  Reproductive: Prostate is unremarkable.  Other: No significant abdominopelvic free fluid.  Musculoskeletal: Nodular soft tissue posterior to the right femoral neck on image 132 which demonstrates hypermetabolism on multiple prior PET-CT is stable and favored an indolent neoplasm. Consider follow-up right hip MRI with and without contrast. Extensive sclerotic osseous metastatic disease is similar prior examination. No new suspicious osseous lesions identified.  IMPRESSION: 1. Stable treated pulmonary neoplasm in the left upper lobe without CT evidence of recurrent disease. 2. Extensive sclerotic osseous metastatic disease is similar prior examination. No new suspicious osseous lesions identified. 3. No new or progressive findings in the chest, abdomen or pelvis. 4. Nodular soft tissue posterior to the right femoral  neck which demonstrates hypermetabolism on multiple prior PET-CT is stable and favored an indolent neoplasm. Consider follow-up right hip MRI with and without contrast. 5. Aortic atherosclerosis.  Aortic Atherosclerosis (ICD10-I70.0).  Electronically Signed   By: Reyes Holder M.D.   On: 10/05/2023 14:14    CODE STATUS:  Code Status History     Date Active Date Inactive Code Status Order ID Comments User Context   07/20/2014 0933 07/21/2014 1200 Full Code [id number]  Louis Shove, MD Inpatient      Advance Directive Documentation    Flowsheet Row Most Recent Value  Type of Advance Directive Healthcare Power of Attorney, Living will  Pre-existing out of facility DNR order (yellow form or pink MOST form) --  MOST Form in Place? --       No orders of the defined types were placed in this encounter.     This document was completed utilizing speech recognition software. Grammatical errors, random word insertions, pronoun errors, and incomplete sentences are an occasional consequence of this system due to software limitations, ambient noise, and hardware issues. Any formal questions or concerns about the content, text or information contained within the body of this dictation should be directly addressed to the provider for clarification.

## 2023-10-06 NOTE — Patient Instructions (Signed)
 Denosumab  Injection (Oncology) What is this medication? DENOSUMAB  (den oh SUE mab) prevents weakened bones caused by cancer. It may also be used to treat noncancerous bone tumors that cannot be removed by surgery. It can also be used to treat high calcium  levels in the blood caused by cancer. It works by blocking a protein that causes bones to break down quickly. This slows down the release of calcium  from bones, which lowers calcium  levels in your blood. It also makes your bones stronger and less likely to break (fracture). This medicine may be used for other purposes; ask your health care provider or pharmacist if you have questions. COMMON BRAND NAME(S): XGEVA  What should I tell my care team before I take this medication? They need to know if you have any of these conditions: Dental disease Having surgery or tooth extraction Infection Kidney disease Low levels of calcium  or vitamin D  in the blood Malnutrition On hemodialysis Skin conditions or sensitivity Thyroid  or parathyroid disease An unusual reaction to denosumab , other medications, foods, dyes, or preservatives Pregnant or trying to get pregnant Breast-feeding How should I use this medication? This medication is for injection under the skin. It is given by your care team in a hospital or clinic setting. A special MedGuide will be given to you before each treatment. Be sure to read this information carefully each time. Talk to your care team about the use of this medication in children. While it may be prescribed for children as young as 13 years for selected conditions, precautions do apply. Overdosage: If you think you have taken too much of this medicine contact a poison control center or emergency room at once. NOTE: This medicine is only for you. Do not share this medicine with others. What if I miss a dose? Keep appointments for follow-up doses. It is important not to miss your dose. Call your care team if you are unable to  keep an appointment. What may interact with this medication? Do not take this medication with any of the following: Other medications containing denosumab  This medication may also interact with the following: Medications that lower your chance of fighting infection Steroid medications, such as prednisone  or cortisone This list may not describe all possible interactions. Give your health care provider a list of all the medicines, herbs, non-prescription drugs, or dietary supplements you use. Also tell them if you smoke, drink alcohol, or use illegal drugs. Some items may interact with your medicine. What should I watch for while using this medication? Your condition will be monitored carefully while you are receiving this medication. You may need blood work while taking this medication. This medication may increase your risk of getting an infection. Call your care team for advice if you get a fever, chills, sore throat, or other symptoms of a cold or flu. Do not treat yourself. Try to avoid being around people who are sick. You should make sure you get enough calcium  and vitamin D  while you are taking this medication, unless your care team tells you not to. Discuss the foods you eat and the vitamins you take with your care team. Some people who take this medication have severe bone, joint, or muscle pain. This medication may also increase your risk for jaw problems or a broken thigh bone. Tell your care team right away if you have severe pain in your jaw, bones, joints, or muscles. Tell your care team if you have any pain that does not go away or that gets worse. Talk  to your care team if you may be pregnant. Serious birth defects can occur if you take this medication during pregnancy and for 5 months after the last dose. You will need a negative pregnancy test before starting this medication. Contraception is recommended while taking this medication and for 5 months after the last dose. Your care team  can help you find the option that works for you. What side effects may I notice from receiving this medication? Side effects that you should report to your care team as soon as possible: Allergic reactions--skin rash, itching, hives, swelling of the face, lips, tongue, or throat Bone, joint, or muscle pain Low calcium  level--muscle pain or cramps, confusion, tingling, or numbness in the hands or feet Osteonecrosis of the jaw--pain, swelling, or redness in the mouth, numbness of the jaw, poor healing after dental work, unusual discharge from the mouth, visible bones in the mouth Side effects that usually do not require medical attention (report to your care team if they continue or are bothersome): Cough Diarrhea Fatigue Headache Nausea This list may not describe all possible side effects. Call your doctor for medical advice about side effects. You may report side effects to FDA at 1-800-FDA-1088. Where should I keep my medication? This medication is given in a hospital or clinic. It will not be stored at home. NOTE: This sheet is a summary. It may not cover all possible information. If you have questions about this medicine, talk to your doctor, pharmacist, or health care provider.  2024 Elsevier/Gold Standard (2021-06-05 00:00:00)

## 2023-10-07 ENCOUNTER — Other Ambulatory Visit: Payer: Self-pay | Admitting: Oncology

## 2023-10-07 ENCOUNTER — Other Ambulatory Visit: Payer: Self-pay

## 2023-10-07 DIAGNOSIS — C3412 Malignant neoplasm of upper lobe, left bronchus or lung: Secondary | ICD-10-CM

## 2023-10-07 MED ORDER — LORLATINIB 100 MG PO TABS
100.0000 mg | ORAL_TABLET | Freq: Every day | ORAL | 5 refills | Status: DC
  Filled 2023-10-07: qty 30, 30d supply, fill #0
  Filled 2023-10-21: qty 30, 30d supply, fill #1
  Filled 2023-11-26 (×2): qty 30, 30d supply, fill #2
  Filled 2023-12-30: qty 30, 30d supply, fill #3
  Filled 2024-01-26: qty 30, 30d supply, fill #4

## 2023-10-08 ENCOUNTER — Other Ambulatory Visit (HOSPITAL_COMMUNITY): Payer: Self-pay

## 2023-10-08 ENCOUNTER — Other Ambulatory Visit: Payer: Self-pay

## 2023-10-09 ENCOUNTER — Telehealth: Payer: Self-pay | Admitting: Oncology

## 2023-10-09 NOTE — Telephone Encounter (Signed)
 Patient has been scheduled for follow-up visit per 10/06/23 LOS.  Pt noted appt details on personal planner/calendar.

## 2023-10-11 ENCOUNTER — Other Ambulatory Visit: Payer: Self-pay

## 2023-10-13 ENCOUNTER — Other Ambulatory Visit: Payer: Self-pay

## 2023-10-21 ENCOUNTER — Other Ambulatory Visit: Payer: Self-pay

## 2023-10-21 ENCOUNTER — Other Ambulatory Visit (HOSPITAL_BASED_OUTPATIENT_CLINIC_OR_DEPARTMENT_OTHER): Payer: Self-pay

## 2023-10-21 NOTE — Progress Notes (Signed)
 Specialty Pharmacy Refill Coordination Note  Gregg Guerrero is a 73 y.o. male contacted today regarding refills of specialty medication(s) Lorlatinib  (LORBRENA )   Patient requested Delivery   Delivery date: 10/23/23   Verified address: [address], [name], [name]   Medication will be filled on 10/22/23.   Patient is travelling out of country and needs vacation override for early fill. Override has been placed.

## 2023-10-21 NOTE — Progress Notes (Signed)
 Specialty Pharmacy Ongoing Clinical Assessment Note  Gregg Guerrero is a 73 y.o. male who is being followed by the specialty pharmacy service for RxSp Oncology   Patient's specialty medication(s) reviewed today: Lorlatinib  (LORBRENA )   Missed doses in the last 4 weeks: 0   Patient/Caregiver did not have any additional questions or concerns.   Therapeutic benefit summary: Patient is achieving benefit   Adverse events/side effects summary: Experienced adverse events/side effects (leg swelling and joing stiffness, MD is monitoring)   Patient's therapy is appropriate to: Continue    Goals Addressed             This Visit's Progress    Maintain optimal adherence to therapy   On track    Patient is on track. Patient will maintain adherence         Follow up: 3 months  [facility] Specialty Pharmacist

## 2023-10-22 ENCOUNTER — Other Ambulatory Visit: Payer: Self-pay

## 2023-10-23 ENCOUNTER — Other Ambulatory Visit: Payer: Self-pay

## 2023-11-10 ENCOUNTER — Inpatient Hospital Stay: Attending: Oncology

## 2023-11-10 ENCOUNTER — Inpatient Hospital Stay

## 2023-11-10 ENCOUNTER — Inpatient Hospital Stay (HOSPITAL_BASED_OUTPATIENT_CLINIC_OR_DEPARTMENT_OTHER): Admitting: Oncology

## 2023-11-10 VITALS — BP 141/77 | HR 74 | Temp 97.9°F | Resp 18 | Ht 73.0 in | Wt 220.2 lb

## 2023-11-10 DIAGNOSIS — C3412 Malignant neoplasm of upper lobe, left bronchus or lung: Secondary | ICD-10-CM

## 2023-11-10 DIAGNOSIS — Z7722 Contact with and (suspected) exposure to environmental tobacco smoke (acute) (chronic): Secondary | ICD-10-CM | POA: Diagnosis not present

## 2023-11-10 DIAGNOSIS — C7951 Secondary malignant neoplasm of bone: Secondary | ICD-10-CM | POA: Diagnosis present

## 2023-11-10 DIAGNOSIS — E781 Pure hyperglyceridemia: Secondary | ICD-10-CM | POA: Diagnosis not present

## 2023-11-10 DIAGNOSIS — Z79899 Other long term (current) drug therapy: Secondary | ICD-10-CM | POA: Insufficient documentation

## 2023-11-10 LAB — CBC WITH DIFFERENTIAL (CANCER CENTER ONLY)
Abs Immature Granulocytes: 0.02 K/uL (ref 0.00–0.07)
Basophils Absolute: 0 K/uL (ref 0.0–0.1)
Basophils Relative: 0 %
Eosinophils Absolute: 0.2 K/uL (ref 0.0–0.5)
Eosinophils Relative: 5 %
HCT: 42.3 % (ref 39.0–52.0)
Hemoglobin: 14 g/dL (ref 13.0–17.0)
Immature Granulocytes: 0 %
Lymphocytes Relative: 15 %
Lymphs Abs: 0.7 K/uL (ref 0.7–4.0)
MCH: 29.5 pg (ref 26.0–34.0)
MCHC: 33.1 g/dL (ref 30.0–36.0)
MCV: 89.2 fL (ref 80.0–100.0)
Monocytes Absolute: 0.6 K/uL (ref 0.1–1.0)
Monocytes Relative: 12 %
Neutro Abs: 3.5 K/uL (ref 1.7–7.7)
Neutrophils Relative %: 68 %
Platelet Count: 145 K/uL — ABNORMAL LOW (ref 150–400)
RBC: 4.74 MIL/uL (ref 4.22–5.81)
RDW: 13.9 % (ref 11.5–15.5)
WBC Count: 5.1 K/uL (ref 4.0–10.5)
nRBC: 0 % (ref 0.0–0.2)

## 2023-11-10 LAB — LIPID PANEL
Cholesterol: 159 mg/dL (ref 0–200)
HDL: 30 mg/dL — ABNORMAL LOW (ref >40)
LDL Cholesterol: 82 mg/dL (ref 0–99)
Total CHOL/HDL Ratio: 5.3 ratio
Triglycerides: 236 mg/dL — ABNORMAL HIGH (ref <150)
VLDL: 47 mg/dL — ABNORMAL HIGH (ref 0–40)

## 2023-11-10 LAB — CMP (CANCER CENTER ONLY)
ALT: 31 U/L (ref 0–44)
AST: 27 U/L (ref 15–41)
Albumin: 4.5 g/dL (ref 3.5–5.0)
Alkaline Phosphatase: 89 U/L (ref 38–126)
Anion gap: 11 (ref 5–15)
BUN: 21 mg/dL (ref 8–23)
CO2: 24 mmol/L (ref 22–32)
Calcium: 9.9 mg/dL (ref 8.9–10.3)
Chloride: 107 mmol/L (ref 98–111)
Creatinine: 1.04 mg/dL (ref 0.61–1.24)
GFR, Estimated: >60 mL/min (ref >60)
Glucose, Bld: 140 mg/dL — ABNORMAL HIGH (ref 70–99)
Potassium: 4.6 mmol/L (ref 3.5–5.1)
Sodium: 142 mmol/L (ref 135–145)
Total Bilirubin: 0.3 mg/dL (ref 0.0–1.2)
Total Protein: 6.9 g/dL (ref 6.5–8.1)

## 2023-11-10 LAB — MAGNESIUM: Magnesium: 2.1 mg/dL (ref 1.7–2.4)

## 2023-11-10 MED ORDER — DENOSUMAB 120 MG/1.7ML ~~LOC~~ SOLN
120.0000 mg | Freq: Once | SUBCUTANEOUS | Status: AC
  Administered 2023-11-10: 120 mg via SUBCUTANEOUS
  Filled 2023-11-10: qty 1.7

## 2023-11-10 NOTE — Assessment & Plan Note (Signed)
 Triglyceride levels increased from 205 to 429 as of June 2025, likely due to medication. LDL levels are stable at 111.  - Increased atorvastatin  to 40 mg daily from July 2025.   -Labs today showed improved triglyceride level of 233.  Continue atorvastatin  at current dosing. - Monitor triglyceride levels.  - Consider additional medication if triglycerides continue to rise - Discussed potential side effects such as muscle pain or cramps

## 2023-11-10 NOTE — Assessment & Plan Note (Signed)
 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 No evidence of liver or other visceral involvement. Patient has a history of secondhand smoke exposure. Family history of various cancers.   -Previously I discussed staging, diagnosis, prognosis, plan of care, treatment options with the patient and his wife.  Reviewed NCCN guidelines.  Discussed that all treatment options are palliative in nature and not curative intent, given stage IV disease.  They verbalized understanding.  - Ordered [facility] testing on the biopsy specimen to identify potential targetable mutations. 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  No other major actionable mutations.  -Given ALK mutation positivity, he would be candidate for targeted therapy.  Plan made to proceed with Lorlatinib  100 mg daily dosing, starting from 01/14/2023.  Side effects include but not limited to: increased blood pressure, edema, changes in electrolytes, increase in cholesterol and triglycerides, changes in LFTs, peripheral neuropathy, CNS/mood changes, and rare but serious risk for AV block and ILD/pneumonitis.  EKG on 01/13/2023 showed normal sinus rhythm, normal QT interval of 372 ms.  This will be monitored periodically as needed.  -MRI of the brain performed on 01/01/2023 showed no definite evidence of intracranial metastatic disease noted.    -Patient has been compliant with Lorlatinib  and tolerating it very well.  Labs today reveal no dose-limiting toxicities.  Alkaline phosphatase decreased from 316 to 92, indicating a positive response to treatment. Calcium  levels slightly down at 8.3.  -Given persistent hypocalcemia, I previously advised him to increase calcium  supplements to total of 1800 mg daily.  -Restaging PET scan on 03/31/2023 showed resolution of hypermetabolic left upper lobe  pulmonary nodule.  Complete resolution of hypermetabolic activity within the skeleton.  No evidence of metastatic lymphadenopathy or visceral metastatic disease.  This is consistent with excellent response.  I previously reviewed the images with the patient, his wife and his family member who were accompanying.  They were understandably very happy.  After his recent trip to [place], patient developed swelling in both legs around ankles.  As per our instructions, he held Lorlatinib  from 05/14/2023 and resumed it from 05/18/2023.    He was started on Lasix  20 mg daily as needed.  Recent restaging PET scan on 07/03/2023 continue to show no evidence of FDG avid disease.  It did show soft tissue lesion behind the right hip, stable with mild FDG activity, likely unrelated to his lung cancer diagnosis.  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  Patient is asymptomatic.  Will continue monitoring alone.  MRI of the brain on 07/20/2023 showed no evidence of intracranial metastatic disease.  Recent restaging CT chest, abdomen and pelvis on 09/25/2023 showed stable disease without evidence of disease progression.  Plan to continue current management.  Labs today reveal no dose-limiting toxicities.  Plan is to continue Lorlatinib  100 mg daily.    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  This will be continued every 4 weeks.  Dose given today.  Given persistent elevation in triglycerides, we increased atorvastatin  dose to 40 mg daily from July 2025.  Repeat lipid panel is pending from today.  RTC in 5 weeks for labs, follow-up and Xgeva  injection.  Next restaging scan will be obtained approximately 3 months from the last scan and we will obtain CT scans going forward.  Request will be placed on future visits.

## 2023-11-10 NOTE — Patient Instructions (Signed)
 Denosumab  Injection (Oncology) What is this medication? DENOSUMAB  (den oh SUE mab) prevents weakened bones caused by cancer. It may also be used to treat noncancerous bone tumors that cannot be removed by surgery. It can also be used to treat high calcium  levels in the blood caused by cancer. It works by blocking a protein that causes bones to break down quickly. This slows down the release of calcium  from bones, which lowers calcium  levels in your blood. It also makes your bones stronger and less likely to break (fracture). This medicine may be used for other purposes; ask your health care provider or pharmacist if you have questions. COMMON BRAND NAME(S): XGEVA  What should I tell my care team before I take this medication? They need to know if you have any of these conditions: Dental disease Having surgery or tooth extraction Infection Kidney disease Low levels of calcium  or vitamin D  in the blood Malnutrition On hemodialysis Skin conditions or sensitivity Thyroid  or parathyroid disease An unusual reaction to denosumab , other medications, foods, dyes, or preservatives Pregnant or trying to get pregnant Breast-feeding How should I use this medication? This medication is for injection under the skin. It is given by your care team in a hospital or clinic setting. A special MedGuide will be given to you before each treatment. Be sure to read this information carefully each time. Talk to your care team about the use of this medication in children. While it may be prescribed for children as young as 13 years for selected conditions, precautions do apply. Overdosage: If you think you have taken too much of this medicine contact a poison control center or emergency room at once. NOTE: This medicine is only for you. Do not share this medicine with others. What if I miss a dose? Keep appointments for follow-up doses. It is important not to miss your dose. Call your care team if you are unable to  keep an appointment. What may interact with this medication? Do not take this medication with any of the following: Other medications containing denosumab  This medication may also interact with the following: Medications that lower your chance of fighting infection Steroid medications, such as prednisone  or cortisone This list may not describe all possible interactions. Give your health care provider a list of all the medicines, herbs, non-prescription drugs, or dietary supplements you use. Also tell them if you smoke, drink alcohol, or use illegal drugs. Some items may interact with your medicine. What should I watch for while using this medication? Your condition will be monitored carefully while you are receiving this medication. You may need blood work while taking this medication. This medication may increase your risk of getting an infection. Call your care team for advice if you get a fever, chills, sore throat, or other symptoms of a cold or flu. Do not treat yourself. Try to avoid being around people who are sick. You should make sure you get enough calcium  and vitamin D  while you are taking this medication, unless your care team tells you not to. Discuss the foods you eat and the vitamins you take with your care team. Some people who take this medication have severe bone, joint, or muscle pain. This medication may also increase your risk for jaw problems or a broken thigh bone. Tell your care team right away if you have severe pain in your jaw, bones, joints, or muscles. Tell your care team if you have any pain that does not go away or that gets worse. Talk  to your care team if you may be pregnant. Serious birth defects can occur if you take this medication during pregnancy and for 5 months after the last dose. You will need a negative pregnancy test before starting this medication. Contraception is recommended while taking this medication and for 5 months after the last dose. Your care team  can help you find the option that works for you. What side effects may I notice from receiving this medication? Side effects that you should report to your care team as soon as possible: Allergic reactions--skin rash, itching, hives, swelling of the face, lips, tongue, or throat Bone, joint, or muscle pain Low calcium  level--muscle pain or cramps, confusion, tingling, or numbness in the hands or feet Osteonecrosis of the jaw--pain, swelling, or redness in the mouth, numbness of the jaw, poor healing after dental work, unusual discharge from the mouth, visible bones in the mouth Side effects that usually do not require medical attention (report to your care team if they continue or are bothersome): Cough Diarrhea Fatigue Headache Nausea This list may not describe all possible side effects. Call your doctor for medical advice about side effects. You may report side effects to FDA at 1-800-FDA-1088. Where should I keep my medication? This medication is given in a hospital or clinic. It will not be stored at home. NOTE: This sheet is a summary. It may not cover all possible information. If you have questions about this medicine, talk to your doctor, pharmacist, or health care provider.  2024 Elsevier/Gold Standard (2021-06-05 00:00:00)

## 2023-11-10 NOTE — Progress Notes (Signed)
 [facility]  ONCOLOGY CLINIC PROGRESS NOTE   Patient Care Team: Cleotilde Planas, MD as PCP - General (Family Medicine) Revankar, Jennifer SAUNDERS, MD as PCP - Cardiology (Cardiology) Cindie Ole DASEN, MD as PCP - Electrophysiology (Cardiology) Isadora Hose, MD as Consulting Physician (Pulmonary Disease) Prentis Duwaine BROCKS, RN as Oncology Nurse Navigator  PATIENT NAME: [facility] Guerrero   MR#: [id number] DOB: [birth year]  Date of visit: 11/10/2023  ASSESSMENT & PLAN:   [facility] is a 73 y.o. 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 No evidence of liver or other visceral involvement. Patient has a history of secondhand smoke exposure. Family history of various cancers.   -Previously I discussed staging, diagnosis, prognosis, plan of care, treatment options with the patient and his wife.  Reviewed NCCN guidelines.  Discussed that all treatment options are palliative in nature and not curative intent, given stage IV disease.  They verbalized understanding.  - Ordered Foundation Medicine testing on the biopsy specimen to identify potential targetable mutations. 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  No other major actionable mutations.  -Given ALK mutation positivity, he would be candidate for targeted therapy.  Plan made to proceed with Lorlatinib  100 mg daily dosing, starting from 01/14/2023.  Side effects include but not limited to: increased blood pressure, edema,  changes in electrolytes, increase in cholesterol and triglycerides, changes in LFTs, peripheral neuropathy, CNS/mood changes, and rare but serious risk for AV block and ILD/pneumonitis.  EKG on 01/13/2023 showed normal sinus rhythm, normal QT interval of 372 ms.  This will be monitored periodically as needed.  -MRI of the brain performed on 01/01/2023 showed no definite evidence of intracranial metastatic disease noted.    -Patient has been compliant with Lorlatinib  and tolerating it very well.  Labs today reveal no dose-limiting toxicities.  Alkaline phosphatase decreased from 316 to 92, indicating a positive response to treatment. Calcium  levels slightly down at 8.3.  -Given persistent hypocalcemia, I previously advised him to increase calcium  supplements to total of 1800 mg daily.  -Restaging PET scan on 03/31/2023 showed resolution of hypermetabolic left upper lobe pulmonary nodule.  Complete resolution of hypermetabolic activity within the skeleton.  No evidence of metastatic lymphadenopathy or visceral metastatic disease.  This is consistent with excellent response.  I previously reviewed the images with the patient, his wife and his family member who were accompanying.  They were understandably very happy.  After his recent trip to [place], patient developed swelling in both legs around ankles.  As per our instructions, he held Lorlatinib  from 05/14/2023 and resumed it from 05/18/2023.    He was started on Lasix  20 mg daily as needed.  Recent restaging PET scan on 07/03/2023 continue to show no evidence of FDG avid disease.  It did show soft tissue lesion behind the right hip, stable with mild FDG activity, likely unrelated to his lung cancer diagnosis.  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  Patient is asymptomatic.  Will continue monitoring alone.  MRI of the brain on 07/20/2023 showed no evidence of intracranial metastatic  disease.  Recent restaging CT chest, abdomen and pelvis on 09/25/2023 showed stable disease without evidence of disease progression.  Plan to continue current management.  Labs today reveal no dose-limiting toxicities.  Plan is to continue Lorlatinib  100 mg daily.    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  This will be continued every 4 weeks.  Dose given today.  Given persistent elevation in triglycerides, we increased atorvastatin  dose to 40 mg daily from July 2025.  Repeat lipid panel is pending from today.  RTC in 5 weeks for labs, follow-up and Xgeva  injection.  Next restaging scan will be obtained approximately 3 months from the last scan and we will obtain CT scans going forward.  Request will be placed on future visits.  Hypertriglyceridemia Triglyceride levels increased from 205 to 429 as of June 2025, likely due to medication. LDL levels are stable at 111.  - Increased atorvastatin  to 40 mg daily from July 2025.   -Labs today showed improved triglyceride level of 233.  Continue atorvastatin  at current dosing. - Monitor triglyceride levels.  - Consider additional medication if triglycerides continue to rise - Discussed potential side effects such as muscle pain or cramps  Benign neoplasm of right hip (stable) The right hip tumor remains stable and benign. No further investigation is needed unless changes occur. - Monitor for changes  I reviewed lab results and outside records for this visit and discussed relevant results with the patient. Diagnosis, plan of care and treatment options were also discussed in detail with the patient. Opportunity provided to ask questions and answers provided to his apparent satisfaction. Provided instructions to call our clinic with any problems, questions or concerns prior to return visit. I recommended to continue follow-up with PCP and sub-specialists. He verbalized understanding and agreed with the plan.   NCCN  guidelines have been consulted in the planning of this patient's care.  I spent a total of 30 minutes during this encounter with the patient including review of chart and various tests results, discussions about plan of care and coordination of care plan.   Chinita Patten, MD  11/10/2023 2:44 PM  [facility] - A DEPT OF [facility] [id number] [name] [address] Dept: [phone] Dept Fax: [phone]    CHIEF COMPLAINT/ REASON FOR VISIT:   Adenocarcinoma of left lung with metastasis to the bones.  Stage IV disease.  ALK mutation positive.  Current Treatment: Lorlatinib  100 mg p.o. daily starting from 01/14/2023.  INTERVAL HISTORY:    Discussed the use of AI scribe software for clinical note transcription with the patient, who gave verbal consent to proceed.   [facility] Guerrero is here today for repeat clinical assessment.  He started taking Lorlatinib  from 01/14/2023.   History of Present Illness  [facility] is a 73 year old male with lung cancer who presents for follow-up on his treatment response and current symptoms.  He has been undergoing treatment for lung cancer, specifically targeting a lesion in the left upper lobe of his lung. Initially measuring 4.5 centimeters, the lesion has now reduced to 9 millimeters. He has no new symptoms.  He has bone lesions and receives injections as part of his treatment. No new bone pain or issues are reported.  A benign tumor in his right hip has not  changed in size.  He experiences neuropathy in his feet and hands, described as tingling and numbness. He previously tried gabapentin  for nerve pain but discontinued it due to drowsiness. He currently manages the symptoms without medication, noting that the numbness is not overly bothersome.  He takes atorvastatin  40 mg for cholesterol management. His cholesterol levels were slightly better at  the last check.  He takes Lasix  for spleen management, which he feels may not be significantly effective, but he continues to use it. He also takes a multivitamin and trazodone, which helps with sleep.  No new headaches or vision problems. His weight is stable, and he reports no significant changes in his overall health. He is planning a trip to [place] next month.   I have reviewed the past medical history, past surgical history, social history and family history with the patient and they are unchanged from previous note.  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 Additionally there were scattered sclerotic bone lesions diffusely along the skeleton, and numerous calcified lymph nodes throughout the mediastinum and left hilum.    12/04/2022 PET scan   PET scan was notable for a 3.9 cm irregular spiculated mass in the left upper lobe, suspicious for bronchogenic carcinoma. 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  Whole body bone scan confirmed widespread osseous metastases. He denies any associated pain.    12/23/2022 Initial Diagnosis   Malignant neoplasm of upper lobe of left lung [facility]):  He has been under the care of his PCP for what was initially thought to be a pneumonia on CXR. Patient  developed shortness of breath in September 2024 and was seen by his primary care physician.  At that time a chest x-ray was done which showed an infiltrate and question of atypical pneumonia.  The patient was treated with antibiotics however his symptoms did not improve.  A CT of the chest was ordered to further evaluate etiology for nonresolving dyspnea.   CT chest on 11/11/2022 which showed a spiculated nodule in the lingula with diffuse interstitial thickening in the left upper lobe that was concerning for an aggressive process or malignancy.  This led to PET/CT, pulmonology referral and additional evaluation.  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 Per his wife the melanoma was low grade and did not require any further treatment after excision.    12/23/2022 Procedure   Patient had initial consultation in pulmonology clinic on 12/17/2022 and underwent navigational bronchoscopy, EBUS and biopsy of left upper lobe lung nodule on 12/23/2022, performed by Dr. Malka.  On EBUS, hilar and mediastinal lymph node stations were examined but no lymph nodes were encountered.  This was suspected to be secondary to severe calcification noted on CT scan.   12/23/2022  Pathology Results   Pathology from left upper lobe lung nodule biopsy came back positive for adenocarcinoma.  Brushings from left upper lobe and lavage showed rare atypical cells.   12/30/2022 Cancer Staging   Staging form: Lung, AJCC 8th Edition - Clinical stage from 12/30/2022: Stage IVB (cT2a, cN0, cM1c) - Signed by Autumn Millman, MD on 12/30/2022 Stage prefix: Initial diagnosis    Miscellaneous   Request submitted for Foundation Medicine testing on the specimen and Guardant 360 (liquid biopsy) for any actionable mutations and also for PD-L1 testing.  Treatment decisions to be made pending these results.   01/05/2023 Pathology Results   Foundation One CDx:  ALK + EML4-ALK fusion (Variant 1)  PD-L1 TPS 5%  DNMT3A P904L mutation noted (unknown significance in his tumor type).    01/06/2023 -  Chemotherapy   Patient is on Treatment Plan : LUNG NSCLC Lorlatinib  q28d     03/31/2023 PET scan   IMPRESSION: 1. Resolution of hypermetabolic LEFT upper lobe pulmonary nodule. 2. Complete resolution of hypermetabolic activity within the skeleton. Underlying diffuse sclerotic skeletal disease remains consistent with treated metastasis. 3. No evidence of metastatic adenopathy or visceral metastasis. 4. Intense of focal activity posterior to the RIGHT femoral neck. This localizes to small focus of extra osseous soft tissue. This lesion is hypermetabolic on comparison exam with no interval change. Favor lesion unrelated to lung carcinoma. Indeterminate activity. Consider MRI of the RIGHT hip for further evaluation.   07/03/2023 PET scan   No evidence of FDG avid disease.  Stable soft tissue lesion behind right femoral neck, consider MRI for further evaluation.     On 07/20/2023, repeat MRI of the brain showed no evidence of intracranial metastatic disease. The previously demonstrated 2 mm focus of enhancement in the left parietal lobe periventricular white matter is not present on today's study. 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 Mild generalized cerebral atrophy.Known widespread sclerotic osseous metastatic disease.  On 09/25/2023, restaging CT chest abdomen and pelvis showed stable treated pulmonary neoplasm in the left upper lobe without CT evidence of recurrent disease. Extensive sclerotic osseous metastatic disease is similar prior examination. No new suspicious osseous lesions identified. No new or progressive findings in the chest, abdomen or pelvis. Nodular soft tissue posterior to the right femoral neck which demonstrates hypermetabolism on multiple prior PET-CT is stable  and favored an indolent neoplasm. Consider follow-up right hip MRI with and without contrast.  REVIEW OF SYSTEMS:   Review of Systems - Oncology  All other pertinent systems were reviewed with the patient and are negative.  ALLERGIES: He has no allergies on file.  MEDICATIONS:  Current Outpatient Medications  Medication Sig Dispense Refill   atorvastatin  (LIPITOR) 40 MG tablet Take 1 tablet (40 mg total) by mouth at bedtime. 30 tablet 3   benzonatate (TESSALON) 200 MG capsule Take 200 mg by mouth 3 (three) times daily as needed for cough.     calcium  carbonate (SUPER CALCIUM ) 1500 (600 Ca) MG TABS tablet Take 600 mg of elemental calcium  by mouth 3 (three) times daily with meals.     cetirizine (ZYRTEC) 10 MG tablet 10 mg daily.     Cholecalciferol 125 MCG (5000 UT) capsule Take 5,000 Units by mouth daily.     clonazePAM  (KLONOPIN ) 1 MG tablet Take 1 mg by mouth at bedtime.     clopidogrel  (PLAVIX ) 75 MG tablet Take 75 mg by mouth daily.     diphenoxylate -atropine  (  LOMOTIL ) 2.5-0.025 MG tablet Take 1 tablet by mouth 4 (four) times daily as needed for diarrhea or loose stools. 90 tablet 1   Ferrous Sulfate (IRON) 325 (65 FE) MG TABS Take 325 mg by mouth daily.     fluticasone (FLONASE) 50 MCG/ACT nasal spray Place 1 spray into both nostrils daily as needed for allergies.     furosemide  (LASIX ) 20 MG tablet Take 1 tablet (20 mg total) by mouth daily as needed for edema. 90 tablet 3   Ibuprofen 200 MG CAPS Take 1 capsule by mouth 3 (three) times daily as needed (pain).     latanoprost (XALATAN) 0.005 % ophthalmic solution Place 1 drop into both eyes at bedtime.     lorlatinib  (LORBRENA ) 100 MG tablet Take 1 tablet (100 mg total) by mouth daily. Swallow tablets whole. Do not chew, crush or split tablets. 28 tablet 5   MAGNESIUM PO Take 1 tablet by mouth daily.     Multiple Vitamin (MULTIVITAMIN) tablet Take 1 tablet by mouth daily.     omeprazole  (PRILOSEC) 20 MG capsule Take 20 mg by mouth  daily.     ondansetron  (ZOFRAN -ODT) 8 MG disintegrating tablet Take 1 tablet (8 mg total) by mouth every 8 (eight) hours as needed for nausea or vomiting. 60 tablet 3   PREVIDENT 5000 BOOSTER PLUS 1.1 % PSTE Take 1 Application by mouth once as needed (Dry mouth).     traZODone (DESYREL) 50 MG tablet Take 25 mg by mouth at bedtime.     No current facility-administered medications for this visit.     VITALS:   Blood pressure (!) 141/77, pulse 74, temperature 97.9 F (36.6 C), temperature source Oral, resp. rate 18, height 6' 1 (1.854 m), weight 220 lb 3.2 oz (99.9 kg), SpO2 96%.  Wt Readings from Last 3 Encounters:  11/10/23 220 lb 3.2 oz (99.9 kg)  10/06/23 217 lb 4.8 oz (98.6 kg)  09/08/23 218 lb (98.9 kg)    Body mass index is 29.05 kg/m.   Onc Performance Status - 11/10/23 1426       ECOG Perf Status   ECOG Perf Status Fully active, able to carry on all pre-disease performance without restriction      KPS SCALE   KPS % SCORE Normal, no compliants, no evidence of disease           PHYSICAL EXAM:   Physical Exam Constitutional:      General: He is not in acute distress.    Appearance: Normal appearance.  HENT:     Head: Normocephalic and atraumatic.  Eyes:     General: No scleral icterus.    Conjunctiva/sclera: Conjunctivae normal.  Cardiovascular:     Rate and Rhythm: Normal rate and regular rhythm.     Heart sounds: Normal heart sounds.  Pulmonary:     Effort: Pulmonary effort is normal.     Breath sounds: Normal breath sounds.  Abdominal:     General: There is no distension.  Musculoskeletal:     Right lower leg: Edema (1+) present.     Left lower leg: Edema (1+) present.  Lymphadenopathy:     Cervical: No cervical adenopathy.  Neurological:     General: No focal deficit present.     Mental Status: He is alert and oriented to person, place, and time.  Psychiatric:        Mood and Affect: Mood normal.        Behavior: Behavior normal.  Thought  Content: Thought content normal.       LABORATORY DATA:   I have reviewed the data as listed.  Results for orders placed or performed in visit on 11/10/23  Magnesium  Result Value Ref Range   Magnesium 2.1 1.7 - 2.4 mg/dL  CMP (Cancer Center only)  Result Value Ref Range   Sodium 142 135 - 145 mmol/L   Potassium 4.6 3.5 - 5.1 mmol/L   Chloride 107 98 - 111 mmol/L   CO2 24 22 - 32 mmol/L   Glucose, Bld 140 (H) 70 - 99 mg/dL   BUN 21 8 - 23 mg/dL   Creatinine 8.95 9.38 - 1.24 mg/dL   Calcium  9.9 8.9 - 10.3 mg/dL   Total Protein 6.9 6.5 - 8.1 g/dL   Albumin 4.5 3.5 - 5.0 g/dL   AST 27 15 - 41 U/L   ALT 31 0 - 44 U/L   Alkaline Phosphatase 89 38 - 126 U/L   Total Bilirubin 0.3 0.0 - 1.2 mg/dL   GFR, Estimated >39 >39 mL/min   Anion gap 11 5 - 15  CBC with Differential (Cancer Center Only)  Result Value Ref Range   WBC Count 5.1 4.0 - 10.5 K/uL   RBC 4.74 4.22 - 5.81 MIL/uL   Hemoglobin 14.0 13.0 - 17.0 g/dL   HCT 57.6 60.9 - 47.9 %   MCV 89.2 80.0 - 100.0 fL   MCH 29.5 26.0 - 34.0 pg   MCHC 33.1 30.0 - 36.0 g/dL   RDW 86.0 88.4 - 84.4 %   Platelet Count 145 (L) 150 - 400 K/uL   nRBC 0.0 0.0 - 0.2 %   Neutrophils Relative % 68 %   Neutro Abs 3.5 1.7 - 7.7 K/uL   Lymphocytes Relative 15 %   Lymphs Abs 0.7 0.7 - 4.0 K/uL   Monocytes Relative 12 %   Monocytes Absolute 0.6 0.1 - 1.0 K/uL   Eosinophils Relative 5 %   Eosinophils Absolute 0.2 0.0 - 0.5 K/uL   Basophils Relative 0 %   Basophils Absolute 0.0 0.0 - 0.1 K/uL   Immature Granulocytes 0 %   Abs Immature Granulocytes 0.02 0.00 - 0.07 K/uL       RADIOGRAPHIC STUDIES:  CT CHEST ABDOMEN PELVIS W CONTRAST CLINICAL DATA:  Follow-up stage IV lung cancer. * Tracking Code: BO *  EXAM: CT CHEST, ABDOMEN, AND PELVIS WITH CONTRAST  TECHNIQUE: Multidetector CT imaging of the chest, abdomen and pelvis was performed following the standard protocol during bolus administration of intravenous  contrast.  RADIATION DOSE REDUCTION: This exam was performed according to the departmental dose-optimization program which includes automated exposure control, adjustment of the mA and/or kV according to patient size and/or use of iterative reconstruction technique.  CONTRAST:  OMNIPAQUE  IOHEXOL  300 MG/ML  SOLN  COMPARISON:  Multiple priors including PET-CT July 03, 2023.  FINDINGS: CT CHEST FINDINGS  Cardiovascular: Normal caliber thoracic aorta. Normal size heart. No significant pericardial effusion/thickening.  Mediastinum/Nodes: No suspicious thyroid  nodule. Calcified mediastinal and left hilar lymph nodes. No pathologically enlarged mediastinal, hilar or axillary lymph nodes.  Lungs/Pleura: Biapical pleuroparenchymal scarring. Scattered atelectasis/scarring.  Spiculated left upper lobe pulmonary nodule measures 9 mm on image 72/4, previously measuring 8 mm on PET-CT July 03, 2023 without hypermetabolism on that examination, on CT December 18, 2022 this measured 4.5 x 2.3 cm, findings compatible with treated pulmonary neoplasm.  No new suspicious pulmonary nodules or masses.  Musculoskeletal: Extensive sclerotic osseous metastasis involving  the axial and appendicular skeleton are stable from prior. No new suspicious osseous lesion identified.  CT ABDOMEN PELVIS FINDINGS  Hepatobiliary: No suspicious hepatic lesion. Gallbladder is unremarkable. No biliary ductal dilation.  Pancreas: Fatty pancreatic atrophy.  Spleen: No splenomegaly.  Adrenals/Urinary Tract: No suspicious adrenal nodule/mass. No hydronephrosis. Bilateral renal cysts and renal lesions technically too small to accurately characterize are stable.  Stomach/Bowel: Stomach is within normal limits. No evidence of bowel wall thickening, distention, or inflammatory changes.  Vascular/Lymphatic: Aortic atherosclerosis. No pathologically enlarged abdominal or pelvic lymph nodes.  Reproductive:  Prostate is unremarkable.  Other: No significant abdominopelvic free fluid.  Musculoskeletal: Nodular soft tissue posterior to the right femoral neck on image 132 which demonstrates hypermetabolism on multiple prior PET-CT is stable and favored an indolent neoplasm. Consider follow-up right hip MRI with and without contrast. Extensive sclerotic osseous metastatic disease is similar prior examination. No new suspicious osseous lesions identified.  IMPRESSION: 1. Stable treated pulmonary neoplasm in the left upper lobe without CT evidence of recurrent disease. 2. Extensive sclerotic osseous metastatic disease is similar prior examination. No new suspicious osseous lesions identified. 3. No new or progressive findings in the chest, abdomen or pelvis. 4. Nodular soft tissue posterior to the right femoral neck which demonstrates hypermetabolism on multiple prior PET-CT is stable and favored an indolent neoplasm. Consider follow-up right hip MRI with and without contrast. 5. Aortic atherosclerosis.  Aortic Atherosclerosis (ICD10-I70.0).  Electronically Signed   By: Reyes Holder M.D.   On: 10/05/2023 14:14    CODE STATUS:  Code Status History     Date Active Date Inactive Code Status Order ID Comments User Context   07/20/2014 0933 07/21/2014 1200 Full Code [id number]  Louis Shove, MD Inpatient      Advance Directive Documentation    Flowsheet Row Most Recent Value  Type of Advance Directive Healthcare Power of Attorney, Living will  Pre-existing out of facility DNR order (yellow form or pink MOST form) --  MOST Form in Place? --       No orders of the defined types were placed in this encounter.   Future Appointments  Date Time Provider Department Center  11/10/2023  3:00 PM [facility] None    This document was completed utilizing speech recognition software. Grammatical errors, random word insertions, pronoun errors, and incomplete sentences are  an occasional consequence of this system due to software limitations, ambient noise, and hardware issues. Any formal questions or concerns about the content, text or information contained within the body of this dictation should be directly addressed to the provider for clarification.

## 2023-11-11 ENCOUNTER — Encounter: Payer: Self-pay | Admitting: Oncology

## 2023-11-11 ENCOUNTER — Other Ambulatory Visit: Payer: Self-pay

## 2023-11-25 ENCOUNTER — Other Ambulatory Visit: Payer: Self-pay

## 2023-11-26 ENCOUNTER — Other Ambulatory Visit: Payer: Self-pay

## 2023-11-26 ENCOUNTER — Other Ambulatory Visit (HOSPITAL_COMMUNITY): Payer: Self-pay

## 2023-12-01 ENCOUNTER — Other Ambulatory Visit (HOSPITAL_COMMUNITY): Payer: Self-pay

## 2023-12-01 ENCOUNTER — Other Ambulatory Visit: Payer: Self-pay

## 2023-12-01 NOTE — Progress Notes (Signed)
 Specialty Pharmacy Refill Coordination Note  [facility] is a 73 y.o. male contacted today regarding refills of specialty medication(s) Lorlatinib  (LORBRENA )   Patient requested Delivery   Delivery date: 12/08/23   Verified address: [address], [name], [name]   Medication will be filled on: 12/07/23

## 2023-12-03 ENCOUNTER — Other Ambulatory Visit: Payer: Self-pay

## 2023-12-07 ENCOUNTER — Inpatient Hospital Stay (HOSPITAL_BASED_OUTPATIENT_CLINIC_OR_DEPARTMENT_OTHER): Admitting: Oncology

## 2023-12-07 ENCOUNTER — Inpatient Hospital Stay: Attending: Oncology

## 2023-12-07 ENCOUNTER — Encounter: Payer: Self-pay | Admitting: Oncology

## 2023-12-07 ENCOUNTER — Inpatient Hospital Stay

## 2023-12-07 ENCOUNTER — Other Ambulatory Visit: Payer: Self-pay

## 2023-12-07 VITALS — BP 143/74 | HR 96 | Temp 97.7°F | Resp 18 | Ht 73.0 in | Wt 218.4 lb

## 2023-12-07 DIAGNOSIS — C7951 Secondary malignant neoplasm of bone: Secondary | ICD-10-CM | POA: Diagnosis not present

## 2023-12-07 DIAGNOSIS — D367 Benign neoplasm of other specified sites: Secondary | ICD-10-CM | POA: Insufficient documentation

## 2023-12-07 DIAGNOSIS — C3412 Malignant neoplasm of upper lobe, left bronchus or lung: Secondary | ICD-10-CM | POA: Insufficient documentation

## 2023-12-07 DIAGNOSIS — R197 Diarrhea, unspecified: Secondary | ICD-10-CM

## 2023-12-07 DIAGNOSIS — R748 Abnormal levels of other serum enzymes: Secondary | ICD-10-CM | POA: Diagnosis not present

## 2023-12-07 DIAGNOSIS — Z79899 Other long term (current) drug therapy: Secondary | ICD-10-CM | POA: Insufficient documentation

## 2023-12-07 DIAGNOSIS — E781 Pure hyperglyceridemia: Secondary | ICD-10-CM | POA: Diagnosis not present

## 2023-12-07 DIAGNOSIS — Z86006 Personal history of melanoma in-situ: Secondary | ICD-10-CM | POA: Insufficient documentation

## 2023-12-07 LAB — MAGNESIUM: Magnesium: 2.2 mg/dL (ref 1.7–2.4)

## 2023-12-07 LAB — CBC WITH DIFFERENTIAL (CANCER CENTER ONLY)
Abs Immature Granulocytes: 0.02 K/uL (ref 0.00–0.07)
Basophils Absolute: 0 K/uL (ref 0.0–0.1)
Basophils Relative: 0 %
Eosinophils Absolute: 0.3 K/uL (ref 0.0–0.5)
Eosinophils Relative: 4 %
HCT: 44.1 % (ref 39.0–52.0)
Hemoglobin: 14.7 g/dL (ref 13.0–17.0)
Immature Granulocytes: 0 %
Lymphocytes Relative: 12 %
Lymphs Abs: 0.7 K/uL (ref 0.7–4.0)
MCH: 30.2 pg (ref 26.0–34.0)
MCHC: 33.3 g/dL (ref 30.0–36.0)
MCV: 90.7 fL (ref 80.0–100.0)
Monocytes Absolute: 0.7 K/uL (ref 0.1–1.0)
Monocytes Relative: 12 %
Neutro Abs: 4.1 K/uL (ref 1.7–7.7)
Neutrophils Relative %: 72 %
Platelet Count: 139 K/uL — ABNORMAL LOW (ref 150–400)
RBC: 4.86 MIL/uL (ref 4.22–5.81)
RDW: 14.1 % (ref 11.5–15.5)
WBC Count: 5.7 K/uL (ref 4.0–10.5)
nRBC: 0 % (ref 0.0–0.2)

## 2023-12-07 LAB — CMP (CANCER CENTER ONLY)
ALT: 29 U/L (ref 0–44)
AST: 21 U/L (ref 15–41)
Albumin: 4.6 g/dL (ref 3.5–5.0)
Alkaline Phosphatase: 69 U/L (ref 38–126)
Anion gap: 9 (ref 5–15)
BUN: 25 mg/dL — ABNORMAL HIGH (ref 8–23)
CO2: 26 mmol/L (ref 22–32)
Calcium: 9.8 mg/dL (ref 8.9–10.3)
Chloride: 107 mmol/L (ref 98–111)
Creatinine: 1.05 mg/dL (ref 0.61–1.24)
GFR, Estimated: >60 mL/min (ref >60)
Glucose, Bld: 124 mg/dL — ABNORMAL HIGH (ref 70–99)
Potassium: 5 mmol/L (ref 3.5–5.1)
Sodium: 142 mmol/L (ref 135–145)
Total Bilirubin: 0.4 mg/dL (ref 0.0–1.2)
Total Protein: 6.8 g/dL (ref 6.5–8.1)

## 2023-12-07 LAB — LIPID PANEL
Cholesterol: 180 mg/dL (ref 0–200)
HDL: 31 mg/dL — ABNORMAL LOW (ref >40)
LDL Cholesterol: 95 mg/dL (ref 0–99)
Total CHOL/HDL Ratio: 5.8 ratio
Triglycerides: 269 mg/dL — ABNORMAL HIGH (ref <150)
VLDL: 54 mg/dL — ABNORMAL HIGH (ref 0–40)

## 2023-12-07 MED ORDER — DENOSUMAB 120 MG/1.7ML ~~LOC~~ SOLN
120.0000 mg | Freq: Once | SUBCUTANEOUS | Status: AC
  Administered 2023-12-07: 120 mg via SUBCUTANEOUS
  Filled 2023-12-07: qty 1.7

## 2023-12-07 MED ORDER — DIPHENOXYLATE-ATROPINE 2.5-0.025 MG PO TABS: 1.0000 | ORAL_TABLET | Freq: Four times a day (QID) | ORAL | 1 refills | Status: AC | PRN

## 2023-12-07 NOTE — Assessment & Plan Note (Signed)
 Previously elevated alkaline phosphatase likely due to bone disease. Discussed the use of Xgeva  to prevent fractures and strengthen bones. -Started Xgeva  injections from 01/27/23 and continue it monthly.  Dose given today. -Given persistent hypocalcemia previously, advised him to continue calcium supplements at 1800 mg daily dose.

## 2023-12-07 NOTE — Progress Notes (Signed)
 [facility]  ONCOLOGY CLINIC PROGRESS NOTE   Patient Care Team: Cleotilde Planas, MD as PCP - General (Family Medicine) Revankar, Jennifer SAUNDERS, MD as PCP - Cardiology (Cardiology) Cindie Ole DASEN, MD as PCP - Electrophysiology (Cardiology) Isadora Hose, MD as Consulting Physician (Pulmonary Disease) Prentis Duwaine BROCKS, RN as Oncology Nurse Navigator  PATIENT NAME: Gregg Guerrero   MR#: [id number] DOB: 02-Sep-1950  Date of visit: 12/07/2023  ASSESSMENT & PLAN:   Gregg Guerrero is a 73 y.o. 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  Started Lorlatinib  from 01/14/2023.  Malignant neoplasm of upper lobe of left lung [facility] Please review oncology history for additional details and timeline of events.  -Previously I discussed staging, diagnosis, prognosis, plan of care, treatment options with the patient and his wife.  Reviewed NCCN guidelines.  Discussed that all treatment options are palliative in nature and not curative intent, given stage IV disease.  They verbalized understanding.  - Ordered Foundation Medicine testing on the biopsy specimen to identify potential targetable mutations. 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  No other major actionable mutations.  -Given ALK mutation positivity, he would be candidate for targeted therapy.  Plan made to proceed with Lorlatinib  100 mg daily dosing, starting from 01/14/2023.  Side effects include but not limited to: increased blood pressure, edema, changes in electrolytes, increase in cholesterol and triglycerides, changes in LFTs, peripheral neuropathy,  CNS/mood changes, and rare but serious risk for AV block and ILD/pneumonitis.  EKG on 01/13/2023 showed normal sinus rhythm, normal QT interval of 372 ms.  This will be monitored periodically as needed.  -MRI of the brain performed on 01/01/2023 showed no definite evidence of intracranial metastatic disease noted.    -Patient has been compliant with Lorlatinib  and tolerating it very well.  Labs today reveal no dose-limiting toxicities.  Alkaline phosphatase decreased from 316 to 69, indicating a positive response to treatment. Calcium  levels now normal at 9.8.  -Given persistent hypocalcemia, I previously advised him to increase calcium  supplements to total of 1800 mg daily.  -Restaging PET scan on 03/31/2023 showed resolution of hypermetabolic left upper lobe pulmonary nodule.  Complete resolution of hypermetabolic activity within the skeleton.  No evidence of metastatic lymphadenopathy or visceral metastatic disease.  This is consistent with excellent response.  I previously reviewed the images with the patient, his wife and his family member who were accompanying.  They were understandably very happy.  After his recent trip to [place], patient developed swelling in both legs around ankles.  As per our instructions, he held Lorlatinib  from 05/14/2023 and resumed it from 05/18/2023.  He was started on Lasix  20 mg daily as needed.  Recent restaging PET scan on 07/03/2023 continue to show no evidence of FDG avid disease.  It did show soft tissue lesion behind the right hip, stable with mild FDG activity, likely unrelated to his lung cancer diagnosis.  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  Patient is asymptomatic.  Will continue monitoring alone.  MRI of the brain on 07/20/2023 showed no evidence of intracranial metastatic disease.  Recent restaging CT chest, abdomen and pelvis on 09/25/2023 showed stable disease without evidence of disease  progression.  Plan to continue current management.  Labs today reveal no dose-limiting toxicities.  Plan is to continue Lorlatinib  100 mg daily.    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  This will be continued every 4 weeks.  Dose given today.  Given persistent elevation in triglycerides, we increased atorvastatin  dose to 40 mg daily from July 2025.  Repeat lipid panel is pending from today.  RTC in 4 weeks for labs, follow-up and Xgeva  injection.  Next restaging scan will be obtained approximately 3 months from the last scan and we will obtain CT scans going forward.  Request placed today.  Metastasis to bone [facility]) Previously elevated alkaline phosphatase likely due to bone disease. Discussed the use of Xgeva  to prevent fractures and strengthen bones. -Started Xgeva  injections from 01/27/23 and continue it monthly.  Dose given today. -Given persistent hypocalcemia previously, advised him to continue calcium  supplements at 1800 mg daily dose.  Hypertriglyceridemia Triglyceride levels increased from 205 to 429 as of June 2025, likely due to medication. LDL levels were stable at 888.  - Increased atorvastatin  to 40 mg daily from July 2025.   - Labs on last visit showed improved triglyceride level of 236.  Continue atorvastatin  at current dosing. - Monitor triglyceride levels.  - Consider additional medication if triglycerides continue to rise - Discussed potential side effects such as muscle pain or cramps  Benign neoplasm of right hip (stable) The right hip tumor remains stable and benign. No further investigation is needed unless changes occur. - Monitor for changes   I reviewed lab results and outside records for this visit and discussed relevant results with the patient. Diagnosis, plan of care and treatment options were also discussed in detail with the patient. Opportunity provided to ask questions and answers provided to his apparent satisfaction.  Provided instructions to call our clinic with any problems, questions or concerns prior to return visit. I recommended to continue follow-up with PCP and sub-specialists. He verbalized understanding and agreed with the plan.   NCCN guidelines have been consulted in the planning of this patient's care.  I spent a total of 40 minutes during this encounter with the patient including review of chart and various tests results, discussions about plan of care and coordination of care plan.   Chinita Patten, MD  12/07/2023 2:11 PM  [facility] - A DEPT OF [facility] [id number] [name] [address] Dept: [phone] Dept Fax: [phone]    CHIEF COMPLAINT/ REASON FOR VISIT:   Adenocarcinoma of left lung with metastasis to the bones.  Stage IV disease.  ALK mutation positive.  Current Treatment: Lorlatinib  100 mg p.o. daily starting from 01/14/2023.  INTERVAL HISTORY:    Discussed the use of AI scribe software for clinical note transcription with the patient, who gave verbal consent to proceed.   Gregg Guerrero is here today for repeat clinical assessment.  He started taking Lorlatinib  from 01/14/2023.   History of Present Illness  Mads Borgmeyer is a 73 year old male with lung cancer who presents for routine follow-up.  He has experienced no major changes in his condition since the last visit and is doing very well. He has no issues with his current medication regimen, specifically noting no diarrhea, and he is tolerating his medications well while maintaining  his weight.  His lung cancer, initially identified in the left lung, has shown significant reduction in size. The lesion measured 4.5 cm in November of the previous year and has now decreased to 9 mm, indicating a reduction of approximately 80%. Bone lesions have remained stable without new growths.  He is currently taking atorvastatin   for cholesterol management, with previous levels reaching as high as 430 mg/dL. His triglycerides were last recorded at 236 mg/dL. He continues on the same dose of atorvastatin  as his cholesterol levels have been stable. He also takes calcium  supplements, three tablets a day, and his calcium  levels have been stable.  He mentions a past trip to [place] and [place], during which his wife discovered his diagnosis but waited until he returned home to inform him. He reflects on the progress made since then, attributing it to the identification of ALK mutation that has influenced his treatment response.  During the review of symptoms, he denies any new or concerning symptoms. Blood counts, including white count, hemoglobin, and platelet count, are within normal limits, though he notes some fluctuation in platelet count. He also reports normal kidney function and blood sugar levels, with a slight indication of dehydration, which he attributes to reduced fluid intake during the winter months.   I have reviewed the past medical history, past surgical history, social history and family history with the patient and they are unchanged from previous note.  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 Additionally there were scattered sclerotic bone lesions diffusely along the skeleton, and numerous calcified lymph nodes throughout the mediastinum and left hilum.     12/04/2022 PET scan   PET scan was notable for a 3.9 cm irregular spiculated mass in the left upper lobe, suspicious for bronchogenic carcinoma. 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  Whole body bone scan confirmed widespread osseous metastases. He denies any associated pain.    12/23/2022 Initial Diagnosis   Malignant neoplasm of upper lobe of left lung [facility]):  He has been under the care of his PCP for what was initially thought to be a pneumonia on CXR. Patient developed shortness of breath in September 2024 and was seen by his primary care physician.  At that time a chest x-ray was done which showed an infiltrate and question of atypical pneumonia.  The patient was treated with antibiotics however his symptoms did not improve.  A CT of the chest was ordered to further evaluate etiology for nonresolving dyspnea.   CT chest on 11/11/2022 which showed a spiculated nodule in the lingula with diffuse interstitial thickening in the left upper lobe that was concerning for an aggressive process or malignancy.  This led to PET/CT, pulmonology referral and additional evaluation.  Of note, patient recently had a melanoma in situ removed from his face and a squamous cell skin cancer  removed from his nose. Per his wife the melanoma was low grade and did not require any further treatment after excision.    12/23/2022 Procedure   Patient had initial consultation in pulmonology clinic on 12/17/2022 and underwent navigational bronchoscopy, EBUS and biopsy of left upper lobe lung nodule on 12/23/2022, performed by Dr. Malka.  On EBUS, hilar and mediastinal lymph node stations were examined but no lymph nodes were encountered.  This was suspected to be secondary to severe calcification noted on CT scan.   12/23/2022 Pathology Results   Pathology from left  upper lobe lung nodule biopsy came back positive for adenocarcinoma.  Brushings from left upper lobe and lavage showed rare atypical cells.   12/30/2022 Cancer Staging   Staging form: Lung, AJCC 8th Edition - Clinical stage from 12/30/2022: Stage IVB (cT2a, cN0, cM1c) - Signed by Autumn Millman, MD on 12/30/2022 Stage prefix: Initial diagnosis    Miscellaneous   Request submitted for Foundation Medicine testing on the specimen and Guardant 360 (liquid biopsy) for any actionable mutations and also for PD-L1 testing.  Treatment decisions to be made pending these results.   01/05/2023 Pathology Results   Foundation One CDx: ALK + EML4-ALK fusion (Variant 1)  PD-L1 TPS 5%  DNMT3A P904L mutation noted (unknown significance in his tumor type).    01/06/2023 -  Chemotherapy   Patient is on Treatment Plan : LUNG NSCLC Lorlatinib  q28d     03/31/2023 PET scan   IMPRESSION: 1. Resolution of hypermetabolic LEFT upper lobe pulmonary nodule. 2. Complete resolution of hypermetabolic activity within the skeleton. Underlying diffuse sclerotic skeletal disease remains consistent with treated metastasis. 3. No evidence of metastatic adenopathy or visceral metastasis. 4. Intense of focal activity posterior to the RIGHT femoral neck. This localizes to small focus of extra osseous soft tissue. This lesion is hypermetabolic on comparison exam with no interval change. Favor lesion unrelated to lung carcinoma. Indeterminate activity. Consider MRI of the RIGHT hip for further evaluation.   07/03/2023 PET scan   No evidence of FDG avid disease.  Stable soft tissue lesion behind right femoral neck, consider MRI for further evaluation.     On 07/20/2023, repeat MRI of the brain showed no evidence of intracranial metastatic disease. The previously demonstrated 2 mm focus of enhancement in the left parietal lobe periventricular white matter is not present on today's study. 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 Mild generalized cerebral atrophy.Known widespread sclerotic osseous metastatic disease.  On 09/25/2023, restaging CT chest abdomen and pelvis showed stable treated pulmonary neoplasm in the left upper lobe without CT evidence of recurrent disease. Extensive sclerotic osseous metastatic disease is similar prior examination. No new suspicious osseous lesions identified. No new or progressive findings in the chest, abdomen or pelvis. Nodular soft tissue posterior to the right femoral neck which demonstrates hypermetabolism on multiple prior PET-CT is stable and favored an indolent neoplasm. Consider follow-up right hip MRI with and without contrast.  REVIEW OF SYSTEMS:   Review of Systems - Oncology  All other pertinent systems were reviewed with the patient and are negative.  ALLERGIES: He has no allergies on file.  MEDICATIONS:  Current Outpatient Medications  Medication Sig Dispense Refill   atorvastatin  (LIPITOR) 40 MG tablet Take 1 tablet (40 mg total) by mouth at bedtime. 30 tablet 3   benzonatate (TESSALON) 200 MG capsule Take 200 mg by mouth 3 (three) times daily as needed for cough.  calcium  carbonate (SUPER CALCIUM ) 1500 (600 Ca) MG TABS tablet Take 600 mg of elemental calcium  by mouth 3 (three) times daily with meals.     cetirizine (ZYRTEC) 10 MG tablet 10 mg daily.     Cholecalciferol 125 MCG (5000 UT) capsule Take 5,000 Units by mouth daily.     clonazePAM  (KLONOPIN ) 1 MG tablet Take 1 mg by mouth at bedtime.     clopidogrel  (PLAVIX ) 75 MG tablet Take 75 mg by mouth daily.     Ferrous Sulfate (IRON) 325 (65 FE) MG TABS Take 325 mg by mouth daily.     fluticasone (FLONASE) 50 MCG/ACT nasal spray Place 1 spray into both nostrils daily as needed for allergies.     furosemide  (LASIX ) 20 MG tablet Take 1 tablet (20 mg total) by mouth daily as needed for edema. 90 tablet 3   Ibuprofen 200 MG CAPS Take 1 capsule  by mouth 3 (three) times daily as needed (pain).     latanoprost (XALATAN) 0.005 % ophthalmic solution Place 1 drop into both eyes at bedtime.     lorlatinib  (LORBRENA ) 100 MG tablet Take 1 tablet (100 mg total) by mouth daily. Swallow tablets whole. Do not chew, crush or split tablets. 28 tablet 5   MAGNESIUM PO Take 1 tablet by mouth daily.     Multiple Vitamin (MULTIVITAMIN) tablet Take 1 tablet by mouth daily.     omeprazole  (PRILOSEC) 20 MG capsule Take 20 mg by mouth daily.     ondansetron  (ZOFRAN -ODT) 8 MG disintegrating tablet Take 1 tablet (8 mg total) by mouth every 8 (eight) hours as needed for nausea or vomiting. 60 tablet 3   PREVIDENT 5000 BOOSTER PLUS 1.1 % PSTE Take 1 Application by mouth once as needed (Dry mouth).     traZODone (DESYREL) 50 MG tablet Take 25 mg by mouth at bedtime.     diphenoxylate -atropine  (LOMOTIL ) 2.5-0.025 MG tablet Take 1 tablet by mouth 4 (four) times daily as needed for diarrhea or loose stools. 90 tablet 1   No current facility-administered medications for this visit.     VITALS:   Blood pressure (!) 143/74, pulse 96, temperature 97.7 F (36.5 C), temperature source Temporal, resp. rate 18, height 6' 1 (1.854 m), weight 218 lb 6.4 oz (99.1 kg), SpO2 (!) 71%.  Wt Readings from Last 3 Encounters:  12/07/23 218 lb 6.4 oz (99.1 kg)  11/10/23 220 lb 3.2 oz (99.9 kg)  10/06/23 217 lb 4.8 oz (98.6 kg)    Body mass index is 28.81 kg/m.   Onc Performance Status - 12/07/23 1135       ECOG Perf Status   ECOG Perf Status Fully active, able to carry on all pre-disease performance without restriction      KPS SCALE   KPS % SCORE Normal, no compliants, no evidence of disease            PHYSICAL EXAM:   Physical Exam Constitutional:      General: He is not in acute distress.    Appearance: Normal appearance.  HENT:     Head: Normocephalic and atraumatic.  Eyes:     General: No scleral icterus.    Conjunctiva/sclera: Conjunctivae normal.   Cardiovascular:     Rate and Rhythm: Normal rate and regular rhythm.     Heart sounds: Normal heart sounds.  Pulmonary:     Effort: Pulmonary effort is normal.     Breath sounds: Normal breath sounds.  Abdominal:  General: There is no distension.  Musculoskeletal:     Right lower leg: Edema (1+) present.     Left lower leg: Edema (1+) present.  Lymphadenopathy:     Cervical: No cervical adenopathy.  Neurological:     General: No focal deficit present.     Mental Status: He is alert and oriented to person, place, and time.  Psychiatric:        Mood and Affect: Mood normal.        Behavior: Behavior normal.        Thought Content: Thought content normal.      LABORATORY DATA:   I have reviewed the data as listed.  Results for orders placed or performed in visit on 12/07/23  Magnesium  Result Value Ref Range   Magnesium 2.2 1.7 - 2.4 mg/dL  CMP (Cancer Center only)  Result Value Ref Range   Sodium 142 135 - 145 mmol/L   Potassium 5.0 3.5 - 5.1 mmol/L   Chloride 107 98 - 111 mmol/L   CO2 26 22 - 32 mmol/L   Glucose, Bld 124 (H) 70 - 99 mg/dL   BUN 25 (H) 8 - 23 mg/dL   Creatinine 8.94 9.38 - 1.24 mg/dL   Calcium  9.8 8.9 - 10.3 mg/dL   Total Protein 6.8 6.5 - 8.1 g/dL   Albumin 4.6 3.5 - 5.0 g/dL   AST 21 15 - 41 U/L   ALT 29 0 - 44 U/L   Alkaline Phosphatase 69 38 - 126 U/L   Total Bilirubin 0.4 0.0 - 1.2 mg/dL   GFR, Estimated >39 >39 mL/min   Anion gap 9 5 - 15  CBC with Differential (Cancer Center Only)  Result Value Ref Range   WBC Count 5.7 4.0 - 10.5 K/uL   RBC 4.86 4.22 - 5.81 MIL/uL   Hemoglobin 14.7 13.0 - 17.0 g/dL   HCT 55.8 60.9 - 47.9 %   MCV 90.7 80.0 - 100.0 fL   MCH 30.2 26.0 - 34.0 pg   MCHC 33.3 30.0 - 36.0 g/dL   RDW 85.8 88.4 - 84.4 %   Platelet Count 139 (L) 150 - 400 K/uL   nRBC 0.0 0.0 - 0.2 %   Neutrophils Relative % 72 %   Neutro Abs 4.1 1.7 - 7.7 K/uL   Lymphocytes Relative 12 %   Lymphs Abs 0.7 0.7 - 4.0 K/uL   Monocytes  Relative 12 %   Monocytes Absolute 0.7 0.1 - 1.0 K/uL   Eosinophils Relative 4 %   Eosinophils Absolute 0.3 0.0 - 0.5 K/uL   Basophils Relative 0 %   Basophils Absolute 0.0 0.0 - 0.1 K/uL   Immature Granulocytes 0 %   Abs Immature Granulocytes 0.02 0.00 - 0.07 K/uL     RADIOGRAPHIC STUDIES:  CT CHEST ABDOMEN PELVIS W CONTRAST CLINICAL DATA:  Follow-up stage IV lung cancer. * Tracking Code: BO *  EXAM: CT CHEST, ABDOMEN, AND PELVIS WITH CONTRAST  TECHNIQUE: Multidetector CT imaging of the chest, abdomen and pelvis was performed following the standard protocol during bolus administration of intravenous contrast.  RADIATION DOSE REDUCTION: This exam was performed according to the departmental dose-optimization program which includes automated exposure control, adjustment of the mA and/or kV according to patient size and/or use of iterative reconstruction technique.  CONTRAST:  OMNIPAQUE  IOHEXOL  300 MG/ML  SOLN  COMPARISON:  Multiple priors including PET-CT July 03, 2023.  FINDINGS: CT CHEST FINDINGS  Cardiovascular: Normal caliber thoracic aorta. Normal size heart.  No significant pericardial effusion/thickening.  Mediastinum/Nodes: No suspicious thyroid  nodule. Calcified mediastinal and left hilar lymph nodes. No pathologically enlarged mediastinal, hilar or axillary lymph nodes.  Lungs/Pleura: Biapical pleuroparenchymal scarring. Scattered atelectasis/scarring.  Spiculated left upper lobe pulmonary nodule measures 9 mm on image 72/4, previously measuring 8 mm on PET-CT July 03, 2023 without hypermetabolism on that examination, on CT December 18, 2022 this measured 4.5 x 2.3 cm, findings compatible with treated pulmonary neoplasm.  No new suspicious pulmonary nodules or masses.  Musculoskeletal: Extensive sclerotic osseous metastasis involving the axial and appendicular skeleton are stable from prior. No new suspicious osseous lesion identified.  CT ABDOMEN  PELVIS FINDINGS  Hepatobiliary: No suspicious hepatic lesion. Gallbladder is unremarkable. No biliary ductal dilation.  Pancreas: Fatty pancreatic atrophy.  Spleen: No splenomegaly.  Adrenals/Urinary Tract: No suspicious adrenal nodule/mass. No hydronephrosis. Bilateral renal cysts and renal lesions technically too small to accurately characterize are stable.  Stomach/Bowel: Stomach is within normal limits. No evidence of bowel wall thickening, distention, or inflammatory changes.  Vascular/Lymphatic: Aortic atherosclerosis. No pathologically enlarged abdominal or pelvic lymph nodes.  Reproductive: Prostate is unremarkable.  Other: No significant abdominopelvic free fluid.  Musculoskeletal: Nodular soft tissue posterior to the right femoral neck on image 132 which demonstrates hypermetabolism on multiple prior PET-CT is stable and favored an indolent neoplasm. Consider follow-up right hip MRI with and without contrast. Extensive sclerotic osseous metastatic disease is similar prior examination. No new suspicious osseous lesions identified.  IMPRESSION: 1. Stable treated pulmonary neoplasm in the left upper lobe without CT evidence of recurrent disease. 2. Extensive sclerotic osseous metastatic disease is similar prior examination. No new suspicious osseous lesions identified. 3. No new or progressive findings in the chest, abdomen or pelvis. 4. Nodular soft tissue posterior to the right femoral neck which demonstrates hypermetabolism on multiple prior PET-CT is stable and favored an indolent neoplasm. Consider follow-up right hip MRI with and without contrast. 5. Aortic atherosclerosis.  Aortic Atherosclerosis (ICD10-I70.0).  Electronically Signed   By: Reyes Holder M.D.   On: 10/05/2023 14:14    CODE STATUS:  Code Status History     Date Active Date Inactive Code Status Order ID Comments User Context   07/20/2014 0933 07/21/2014 1200 Full Code [id number]  Louis Shove, MD Inpatient      Advance Directive Documentation    Flowsheet Row Most Recent Value  Type of Advance Directive Healthcare Power of Attorney, Living will  Pre-existing out of facility DNR order (yellow form or pink MOST form) --  MOST Form in Place? --       Orders Placed This Encounter  Procedures   CT CHEST ABDOMEN PELVIS W CONTRAST    Standing Status:   Future    Expected Date:   12/28/2023    Expiration Date:   12/06/2024    If indicated for the ordered procedure, I authorize the administration of contrast media per Radiology protocol:   Yes    Does the patient have a contrast media/X-ray dye allergy?:   No    Preferred imaging location?:   [facility]    If indicated for the ordered procedure, I authorize the administration of oral contrast media per Radiology protocol:   Yes    Future Appointments  Date Time [facility]  12/28/2023 11:00 AM Cindie Ole DASEN, MD [facility]    This document was completed utilizing speech recognition software. Grammatical errors, random word insertions, pronoun errors, and incomplete sentences are an occasional consequence of this  system due to software limitations, ambient noise, and hardware issues. Any formal questions or concerns about the content, text or information contained within the body of this dictation should be directly addressed to the provider for clarification.

## 2023-12-07 NOTE — Progress Notes (Signed)
Patient seen by  Dr. Seward Carol are within treatment parameters.  Labs reviewed by  Dr. Arlana Pouch   and are within treatment parameters.  Per physician team, patient is ready for treatment and there are NO modifications to the treatment plan.

## 2023-12-07 NOTE — Patient Instructions (Signed)
 Denosumab  Injection (Oncology) What is this medication? DENOSUMAB  (den oh SUE mab) prevents weakened bones caused by cancer. It may also be used to treat noncancerous bone tumors that cannot be removed by surgery. It can also be used to treat high calcium  levels in the blood caused by cancer. It works by blocking a protein that causes bones to break down quickly. This slows down the release of calcium  from bones, which lowers calcium  levels in your blood. It also makes your bones stronger and less likely to break (fracture). This medicine may be used for other purposes; ask your health care provider or pharmacist if you have questions. COMMON BRAND NAME(S): XGEVA  What should I tell my care team before I take this medication? They need to know if you have any of these conditions: Dental disease Having surgery or tooth extraction Infection Kidney disease Low levels of calcium  or vitamin D  in the blood Malnutrition On hemodialysis Skin conditions or sensitivity Thyroid  or parathyroid disease An unusual reaction to denosumab , other medications, foods, dyes, or preservatives Pregnant or trying to get pregnant Breast-feeding How should I use this medication? This medication is for injection under the skin. It is given by your care team in a hospital or clinic setting. A special MedGuide will be given to you before each treatment. Be sure to read this information carefully each time. Talk to your care team about the use of this medication in children. While it may be prescribed for children as young as 13 years for selected conditions, precautions do apply. Overdosage: If you think you have taken too much of this medicine contact a poison control center or emergency room at once. NOTE: This medicine is only for you. Do not share this medicine with others. What if I miss a dose? Keep appointments for follow-up doses. It is important not to miss your dose. Call your care team if you are unable to  keep an appointment. What may interact with this medication? Do not take this medication with any of the following: Other medications containing denosumab  This medication may also interact with the following: Medications that lower your chance of fighting infection Steroid medications, such as prednisone  or cortisone This list may not describe all possible interactions. Give your health care provider a list of all the medicines, herbs, non-prescription drugs, or dietary supplements you use. Also tell them if you smoke, drink alcohol, or use illegal drugs. Some items may interact with your medicine. What should I watch for while using this medication? Your condition will be monitored carefully while you are receiving this medication. You may need blood work while taking this medication. This medication may increase your risk of getting an infection. Call your care team for advice if you get a fever, chills, sore throat, or other symptoms of a cold or flu. Do not treat yourself. Try to avoid being around people who are sick. You should make sure you get enough calcium  and vitamin D  while you are taking this medication, unless your care team tells you not to. Discuss the foods you eat and the vitamins you take with your care team. Some people who take this medication have severe bone, joint, or muscle pain. This medication may also increase your risk for jaw problems or a broken thigh bone. Tell your care team right away if you have severe pain in your jaw, bones, joints, or muscles. Tell your care team if you have any pain that does not go away or that gets worse. Talk  to your care team if you may be pregnant. Serious birth defects can occur if you take this medication during pregnancy and for 5 months after the last dose. You will need a negative pregnancy test before starting this medication. Contraception is recommended while taking this medication and for 5 months after the last dose. Your care team  can help you find the option that works for you. What side effects may I notice from receiving this medication? Side effects that you should report to your care team as soon as possible: Allergic reactions--skin rash, itching, hives, swelling of the face, lips, tongue, or throat Bone, joint, or muscle pain Low calcium  level--muscle pain or cramps, confusion, tingling, or numbness in the hands or feet Osteonecrosis of the jaw--pain, swelling, or redness in the mouth, numbness of the jaw, poor healing after dental work, unusual discharge from the mouth, visible bones in the mouth Side effects that usually do not require medical attention (report to your care team if they continue or are bothersome): Cough Diarrhea Fatigue Headache Nausea This list may not describe all possible side effects. Call your doctor for medical advice about side effects. You may report side effects to FDA at 1-800-FDA-1088. Where should I keep my medication? This medication is given in a hospital or clinic. It will not be stored at home. NOTE: This sheet is a summary. It may not cover all possible information. If you have questions about this medicine, talk to your doctor, pharmacist, or health care provider.  2024 Elsevier/Gold Standard (2021-06-05 00:00:00)

## 2023-12-07 NOTE — Assessment & Plan Note (Addendum)
 Triglyceride levels increased from 205 to 429 as of June 2025, likely due to medication. LDL levels were stable at 888.  - Increased atorvastatin  to 40 mg daily from July 2025.   - Labs on last visit showed improved triglyceride level of 236.  Continue atorvastatin  at current dosing. - Monitor triglyceride levels.  - Consider additional medication if triglycerides continue to rise - Discussed potential side effects such as muscle pain or cramps

## 2023-12-07 NOTE — Assessment & Plan Note (Addendum)
 Please review oncology history for additional details and timeline of events.  -Previously I discussed staging, diagnosis, prognosis, plan of care, treatment options with the patient and his wife.  Reviewed NCCN guidelines.  Discussed that all treatment options are palliative in nature and not curative intent, given stage IV disease.  They verbalized understanding.  - Ordered Foundation Medicine testing on the biopsy specimen to identify potential targetable mutations. 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  No other major actionable mutations.  -Given ALK mutation positivity, he would be candidate for targeted therapy.  Plan made to proceed with Lorlatinib  100 mg daily dosing, starting from 01/14/2023.  Side effects include but not limited to: increased blood pressure, edema, changes in electrolytes, increase in cholesterol and triglycerides, changes in LFTs, peripheral neuropathy, CNS/mood changes, and rare but serious risk for AV block and ILD/pneumonitis.  EKG on 01/13/2023 showed normal sinus rhythm, normal QT interval of 372 ms.  This will be monitored periodically as needed.  -MRI of the brain performed on 01/01/2023 showed no definite evidence of intracranial metastatic disease noted.    -Patient has been compliant with Lorlatinib  and tolerating it very well.  Labs today reveal no dose-limiting toxicities.  Alkaline phosphatase decreased from 316 to 69, indicating a positive response to treatment. Calcium  levels now normal at 9.8.  -Given persistent hypocalcemia, I previously advised him to increase calcium  supplements to total of 1800 mg daily.  -Restaging PET scan on 03/31/2023 showed resolution of hypermetabolic left upper lobe pulmonary nodule.  Complete resolution of hypermetabolic activity within the skeleton.  No evidence of metastatic lymphadenopathy or visceral  metastatic disease.  This is consistent with excellent response.  I previously reviewed the images with the patient, his wife and his family member who were accompanying.  They were understandably very happy.  After his recent trip to [place], patient developed swelling in both legs around ankles.  As per our instructions, he held Lorlatinib  from 05/14/2023 and resumed it from 05/18/2023.  He was started on Lasix  20 mg daily as needed.  Recent restaging PET scan on 07/03/2023 continue to show no evidence of FDG avid disease.  It did show soft tissue lesion behind the right hip, stable with mild FDG activity, likely unrelated to his lung cancer diagnosis.  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  Patient is asymptomatic.  Will continue monitoring alone.  MRI of the brain on 07/20/2023 showed no evidence of intracranial metastatic disease.  Recent restaging CT chest, abdomen and pelvis on 09/25/2023 showed stable disease without evidence of disease progression.  Plan to continue current management.  Labs today reveal no dose-limiting toxicities.  Plan is to continue Lorlatinib  100 mg daily.    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  This will be continued every 4 weeks.  Dose given today.  Given persistent elevation in triglycerides, we increased atorvastatin  dose to 40 mg daily from July 2025.  Repeat lipid panel is pending from today.  RTC in 4 weeks for labs, follow-up and Xgeva  injection.  Next restaging scan will be obtained approximately 3 months from the last scan and we will obtain CT scans going forward.  Request placed today.

## 2023-12-09 ENCOUNTER — Other Ambulatory Visit: Payer: Self-pay

## 2023-12-13 ENCOUNTER — Encounter: Payer: Self-pay | Admitting: Oncology

## 2023-12-14 ENCOUNTER — Other Ambulatory Visit: Payer: Self-pay

## 2023-12-14 MED ORDER — ATORVASTATIN CALCIUM 40 MG PO TABS
40.0000 mg | ORAL_TABLET | Freq: Every evening | ORAL | 1 refills | Status: AC
Start: 2023-12-14 — End: ?

## 2023-12-26 ENCOUNTER — Ambulatory Visit (HOSPITAL_BASED_OUTPATIENT_CLINIC_OR_DEPARTMENT_OTHER)
Admission: RE | Admit: 2023-12-26 | Discharge: 2023-12-26 | Disposition: A | Source: Ambulatory Visit | Attending: Oncology

## 2023-12-26 DIAGNOSIS — C3412 Malignant neoplasm of upper lobe, left bronchus or lung: Secondary | ICD-10-CM | POA: Insufficient documentation

## 2023-12-26 MED ORDER — IOHEXOL 300 MG/ML  SOLN
100.0000 mL | Freq: Once | INTRAMUSCULAR | Status: AC | PRN
  Administered 2023-12-26: 100 mL via INTRAVENOUS

## 2023-12-27 NOTE — Progress Notes (Unsigned)
  Electrophysiology Office Follow up Visit Note:    Date:  12/27/2023   ID:  Gregg Guerrero, DOB 11/24/1950, MRN [id number]  PCP:  Cleotilde Planas, MD  [facility] Cardiologist:  Jennifer JONELLE Crape, MD  [facility] Electrophysiologist:  OLE ONEIDA HOLTS, MD    Interval History:     Gregg Guerrero is a 74 y.o. male who presents for a follow up visit.   I last saw the patient July 07, 2022.  He has a history of atrial fibrillation with prior ablation at [facility] in 2009.  Also has a history of TIA, migraines, PFO, restless leg syndrome and sleep apnea.  He had a subdural hematoma in 2016 requiring a bur hole.  He is doing well.  He was diagnosed with stage IV lung cancer last year but is achieved excellent control of disease on a medication called Lorbrena .  This is followed at [facility].  No problems with his arrhythmia.      Past medical, surgical, social and family history were reviewed.  ROS:   Please see the history of present illness.    All other systems reviewed and are negative.  EKGs/Labs/Other Studies Reviewed:    The following studies were reviewed today:          Physical Exam:    VS:  There were no vitals taken for this visit.    Wt Readings from Last 3 Encounters:  12/07/23 218 lb 6.4 oz (99.1 kg)  11/10/23 220 lb 3.2 oz (99.9 kg)  10/06/23 217 lb 4.8 oz (98.6 kg)     GEN: no distress CARD: RRR, No MRG RESP: No IWOB. CTAB.      ASSESSMENT:    No diagnosis found. PLAN:    In order of problems listed above:  #Paroxysmal atrial fibrillation No recurrence No anticoagulation given history of intracranial hemorrhage  History of TIA No anticoagulant as above  I discussed my upcoming departure from [facility] during today's clinic appointment.  The patient will continue to follow-up with one of my EP partners moving forward.  Follow-up with EP APP in 1 year   Signed, Ole Holts, MD, [facility], [facility] 12/27/2023 10:34  AM    Electrophysiology [facility]

## 2023-12-28 ENCOUNTER — Encounter: Payer: Self-pay | Admitting: Cardiology

## 2023-12-28 ENCOUNTER — Ambulatory Visit: Attending: Cardiology | Admitting: Cardiology

## 2023-12-28 VITALS — BP 126/88 | HR 78 | Ht 73.0 in | Wt 220.1 lb

## 2023-12-28 DIAGNOSIS — Z8679 Personal history of other diseases of the circulatory system: Secondary | ICD-10-CM

## 2023-12-28 DIAGNOSIS — I480 Paroxysmal atrial fibrillation: Secondary | ICD-10-CM

## 2023-12-28 DIAGNOSIS — Z8673 Personal history of transient ischemic attack (TIA), and cerebral infarction without residual deficits: Secondary | ICD-10-CM | POA: Diagnosis not present

## 2023-12-28 NOTE — Patient Instructions (Signed)

## 2023-12-30 ENCOUNTER — Other Ambulatory Visit: Payer: Self-pay

## 2024-01-01 ENCOUNTER — Other Ambulatory Visit: Payer: Self-pay

## 2024-01-01 NOTE — Progress Notes (Signed)
 Specialty Pharmacy Refill Coordination Note  Gregg Guerrero is a 73 y.o. male contacted today regarding refills of specialty medication(s) Lorlatinib  (LORBRENA )   Patient requested Delivery   Delivery date: 01/05/24   Verified address: [address], [name], [name]   Medication will be filled on: 01/04/24

## 2024-01-04 ENCOUNTER — Encounter: Payer: Self-pay | Admitting: Oncology

## 2024-01-04 ENCOUNTER — Inpatient Hospital Stay: Attending: Oncology

## 2024-01-04 ENCOUNTER — Inpatient Hospital Stay: Admitting: Nutrition

## 2024-01-04 ENCOUNTER — Other Ambulatory Visit: Payer: Self-pay

## 2024-01-04 ENCOUNTER — Inpatient Hospital Stay

## 2024-01-04 ENCOUNTER — Other Ambulatory Visit (HOSPITAL_COMMUNITY): Payer: Self-pay

## 2024-01-04 ENCOUNTER — Inpatient Hospital Stay: Admitting: Oncology

## 2024-01-04 VITALS — BP 139/78 | HR 79 | Temp 97.9°F | Resp 16 | Wt 220.5 lb

## 2024-01-04 DIAGNOSIS — G629 Polyneuropathy, unspecified: Secondary | ICD-10-CM | POA: Diagnosis not present

## 2024-01-04 DIAGNOSIS — C3412 Malignant neoplasm of upper lobe, left bronchus or lung: Secondary | ICD-10-CM | POA: Diagnosis not present

## 2024-01-04 DIAGNOSIS — Z79899 Other long term (current) drug therapy: Secondary | ICD-10-CM | POA: Insufficient documentation

## 2024-01-04 DIAGNOSIS — C7951 Secondary malignant neoplasm of bone: Secondary | ICD-10-CM

## 2024-01-04 DIAGNOSIS — G6289 Other specified polyneuropathies: Secondary | ICD-10-CM | POA: Diagnosis not present

## 2024-01-04 DIAGNOSIS — E781 Pure hyperglyceridemia: Secondary | ICD-10-CM

## 2024-01-04 LAB — CBC WITH DIFFERENTIAL (CANCER CENTER ONLY)
Abs Immature Granulocytes: 0.04 K/uL (ref 0.00–0.07)
Basophils Absolute: 0 K/uL (ref 0.0–0.1)
Basophils Relative: 0 %
Eosinophils Absolute: 0.3 K/uL (ref 0.0–0.5)
Eosinophils Relative: 5 %
HCT: 42.5 % (ref 39.0–52.0)
Hemoglobin: 14.2 g/dL (ref 13.0–17.0)
Immature Granulocytes: 1 %
Lymphocytes Relative: 12 %
Lymphs Abs: 0.6 K/uL — ABNORMAL LOW (ref 0.7–4.0)
MCH: 30 pg (ref 26.0–34.0)
MCHC: 33.4 g/dL (ref 30.0–36.0)
MCV: 89.9 fL (ref 80.0–100.0)
Monocytes Absolute: 0.7 K/uL (ref 0.1–1.0)
Monocytes Relative: 13 %
Neutro Abs: 3.6 K/uL (ref 1.7–7.7)
Neutrophils Relative %: 69 %
Platelet Count: 149 K/uL — ABNORMAL LOW (ref 150–400)
RBC: 4.73 MIL/uL (ref 4.22–5.81)
RDW: 14 % (ref 11.5–15.5)
WBC Count: 5.2 K/uL (ref 4.0–10.5)
nRBC: 0 % (ref 0.0–0.2)

## 2024-01-04 LAB — CMP (CANCER CENTER ONLY)
ALT: 25 U/L (ref 0–44)
AST: 20 U/L (ref 15–41)
Albumin: 4.2 g/dL (ref 3.5–5.0)
Alkaline Phosphatase: 84 U/L (ref 38–126)
Anion gap: 10 (ref 5–15)
BUN: 25 mg/dL — ABNORMAL HIGH (ref 8–23)
CO2: 26 mmol/L (ref 22–32)
Calcium: 10.1 mg/dL (ref 8.9–10.3)
Chloride: 106 mmol/L (ref 98–111)
Creatinine: 1.15 mg/dL (ref 0.61–1.24)
GFR, Estimated: >60 mL/min (ref >60)
Glucose, Bld: 172 mg/dL — ABNORMAL HIGH (ref 70–99)
Potassium: 4.7 mmol/L (ref 3.5–5.1)
Sodium: 142 mmol/L (ref 135–145)
Total Bilirubin: 0.4 mg/dL (ref 0.0–1.2)
Total Protein: 6.7 g/dL (ref 6.5–8.1)

## 2024-01-04 LAB — MAGNESIUM: Magnesium: 1.9 mg/dL (ref 1.7–2.4)

## 2024-01-04 LAB — LIPID PANEL
Cholesterol: 185 mg/dL (ref 0–200)
HDL: 31 mg/dL — ABNORMAL LOW (ref >40)
LDL Cholesterol: 84 mg/dL (ref 0–99)
Total CHOL/HDL Ratio: 6 ratio
Triglycerides: 348 mg/dL — ABNORMAL HIGH (ref <150)
VLDL: 70 mg/dL — ABNORMAL HIGH (ref 0–40)

## 2024-01-04 MED ORDER — DENOSUMAB 120 MG/1.7ML ~~LOC~~ SOLN
120.0000 mg | Freq: Once | SUBCUTANEOUS | Status: AC
  Administered 2024-01-04: 120 mg via SUBCUTANEOUS
  Filled 2024-01-04: qty 1.7

## 2024-01-04 NOTE — Progress Notes (Signed)
 Patient seen by Dr. Archie Patten Pasam today  Vitals are within treatment parameters:Yes   Labs are within treatment parameters: Yes   Treatment plan has been signed: Yes   Per physician team, Patient is ready for treatment and there are NO modifications to the treatment plan.

## 2024-01-04 NOTE — Assessment & Plan Note (Signed)
 Persistent numbness in the bottom of the feet, likely a side effect of Lobrena, affecting approximately one-third to half of patients. No balance issues reported. Current symptoms do not warrant dosage adjustment. - Continue to monitor symptoms and adjust dosage only if side effects become debilitating.

## 2024-01-04 NOTE — Assessment & Plan Note (Addendum)
 Please review oncology history for additional details and timeline of events.  -Previously I discussed staging, diagnosis, prognosis, plan of care, treatment options with the patient and his wife.  Reviewed NCCN guidelines.  Discussed that all treatment options are palliative in nature and not curative intent, given stage IV disease.  They verbalized understanding.  - Ordered Foundation Medicine testing on the biopsy specimen to identify potential targetable mutations. 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  No other major actionable mutations.  -Given ALK mutation positivity, he would be candidate for targeted therapy.  Plan made to proceed with Lorlatinib  100 mg daily dosing, starting from 01/14/2023.  Side effects include but not limited to: increased blood pressure, edema, changes in electrolytes, increase in cholesterol and triglycerides, changes in LFTs, peripheral neuropathy, CNS/mood changes, and rare but serious risk for AV block and ILD/pneumonitis.  EKG on 01/13/2023 showed normal sinus rhythm, normal QT interval of 372 ms.  This will be monitored periodically as needed.  -MRI of the brain performed on 01/01/2023 showed no definite evidence of intracranial metastatic disease noted.    -Patient has been compliant with Lorlatinib  and tolerating it very well.  Labs today reveal no dose-limiting toxicities.  Alkaline phosphatase decreased from 316 to 69, indicating a positive response to treatment. Calcium  levels now normal at 9.8.  -Given persistent hypocalcemia, I previously advised him to increase calcium  supplements to total of 1800 mg daily.  -Restaging PET scan on 03/31/2023 showed resolution of hypermetabolic left upper lobe pulmonary nodule.  Complete resolution of hypermetabolic activity within the skeleton.  No evidence of metastatic lymphadenopathy or visceral  metastatic disease.  This is consistent with excellent response.  I previously reviewed the images with the patient, his wife and his family member who were accompanying.  They were understandably very happy.  After his recent trip to [place], patient developed swelling in both legs around ankles.  As per our instructions, he held Lorlatinib  from 05/14/2023 and resumed it from 05/18/2023.  He was started on Lasix  20 mg daily as needed.  Recent restaging PET scan on 07/03/2023 continue to show no evidence of FDG avid disease.  It did show soft tissue lesion behind the right hip, stable with mild FDG activity, likely unrelated to his lung cancer diagnosis.  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  Patient is asymptomatic.  Will continue monitoring alone.  MRI of the brain on 07/20/2023 showed no evidence of intracranial metastatic disease.  On 12/26/2023, restaging CT chest, abdomen and pelvis showed stable disease, without evidence of disease progression. Plan to continue current management.  Labs today reveal no dose-limiting toxicities.  Plan is to continue Lorlatinib  100 mg daily.    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  This will be continued every 4 weeks.  Dose given today.  Given persistent elevation in triglycerides, we increased atorvastatin  dose to 40 mg daily from July 2025.  Repeat lipid panel is pending from today.  RTC in 4 weeks for labs, follow-up and Xgeva  injection.  Next restaging scan will be obtained approximately 3 months from the last scan and we will obtain CT scans going forward.  This will be ordered on future visits.

## 2024-01-04 NOTE — Progress Notes (Signed)
 Patient is a 73 yo male diagnosed with metastatic lung cancer and followed by Dr. Autumn. Started Lorlatinib  from 01/14/2023.   Labs and medications reviewed.   Weight increased to 220 pounds 8 oz on December 8.  Patient feels great and is so pleased that medications is showing positive results. He has no concerns with oral intake. He points out his swelling in his lower legs and says he is told to elevate them and he is compliant. Denies nausea, vomiting, diarrhea and constipation. No nutrition concerns.  No nutrition diagnosis. Encouraged patient to contact RD as needed.

## 2024-01-04 NOTE — Assessment & Plan Note (Signed)
 Previously elevated alkaline phosphatase likely due to bone disease. Discussed the use of Xgeva  to prevent fractures and strengthen bones. -Started Xgeva  injections from 01/27/23 and continue it monthly.  Dose given today. -Given persistent hypocalcemia previously, advised him to continue calcium supplements at 1800 mg daily dose.

## 2024-01-04 NOTE — Progress Notes (Signed)
 Specialty Pharmacy Ongoing Clinical Assessment Note  RALF KONOPKA is a 73 y.o. male who is being followed by the specialty pharmacy service for Rx[facility] Oncology   Patient's specialty medication(s) reviewed today: Lorlatinib  (LORBRENA )   Missed doses in the last 4 weeks: 0   Patient/Caregiver did not have any additional questions or concerns.   Therapeutic benefit summary: Patient is achieving benefit   Adverse events/side effects summary: Experienced adverse events/side effects (leg swelling and numbness in feet - Dr. Autumn is aware and monitoring)   Patient's therapy is appropriate to: Continue    Goals Addressed             This Visit's Progress    Maintain optimal adherence to therapy   On track    Patient is on track. Patient will maintain adherence         Follow up: 3 months  [facility] Specialty Pharmacist

## 2024-01-04 NOTE — Assessment & Plan Note (Signed)
 Triglyceride levels increased from 205 to 429 as of June 2025, likely due to medication. LDL levels were stable at 888.  - Increased atorvastatin  to 40 mg daily from July 2025.   - Labs on last visit showed improved triglyceride level of 236.  Continue atorvastatin  at current dosing. - Monitor triglyceride levels.  - Consider additional medication if triglycerides continue to rise - Discussed potential side effects such as muscle pain or cramps

## 2024-01-04 NOTE — Progress Notes (Signed)
 [facility]  ONCOLOGY CLINIC PROGRESS NOTE   Patient Care Team: Cleotilde Planas, MD as PCP - General (Family Medicine) Revankar, Jennifer SAUNDERS, MD as PCP - Cardiology (Cardiology) Almetta Donnice LABOR, MD as PCP - Electrophysiology (Cardiology) Isadora Hose, MD as Consulting Physician (Pulmonary Disease) Prentis Duwaine BROCKS, RN as Oncology Nurse Navigator  PATIENT NAME: Gregg Guerrero   MR#: [id number] DOB: 1950/03/09  Date of visit: 01/04/2024  ASSESSMENT & PLAN:   SHANNA STRENGTH is a 73 y.o. 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  Started Lorlatinib  from 01/14/2023.  Malignant neoplasm of upper lobe of left lung [facility]) Please review oncology history for additional details and timeline of events.  -Previously I discussed staging, diagnosis, prognosis, plan of care, treatment options with the patient and his wife.  Reviewed NCCN guidelines.  Discussed that all treatment options are palliative in nature and not curative intent, given stage IV disease.  They verbalized understanding.  - Ordered Foundation Medicine testing on the biopsy specimen to identify potential targetable mutations. 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  No other major actionable mutations.  -Given ALK mutation positivity, he would be candidate for targeted therapy.  Plan made to proceed with Lorlatinib  100 mg daily dosing, starting from 01/14/2023.  Side effects include but not limited to: increased blood pressure, edema, changes in electrolytes, increase in cholesterol and triglycerides, changes in LFTs, peripheral neuropathy,  CNS/mood changes, and rare but serious risk for AV block and ILD/pneumonitis.  EKG on 01/13/2023 showed normal sinus rhythm, normal QT interval of 372 ms.  This will be monitored periodically as needed.  -MRI of the brain performed on 01/01/2023 showed no definite evidence of intracranial metastatic disease noted.    -Patient has been compliant with Lorlatinib  and tolerating it very well.  Labs today reveal no dose-limiting toxicities.  Alkaline phosphatase decreased from 316 to 69, indicating a positive response to treatment. Calcium  levels now normal at 9.8.  -Given persistent hypocalcemia, I previously advised him to increase calcium  supplements to total of 1800 mg daily.  -Restaging PET scan on 03/31/2023 showed resolution of hypermetabolic left upper lobe pulmonary nodule.  Complete resolution of hypermetabolic activity within the skeleton.  No evidence of metastatic lymphadenopathy or visceral metastatic disease.  This is consistent with excellent response.  I previously reviewed the images with the patient, his wife and his family member who were accompanying.  They were understandably very happy.  After his recent trip to [place], patient developed swelling in both legs around ankles.  As per our instructions, he held Lorlatinib  from 05/14/2023 and resumed it from 05/18/2023.  He was started on Lasix  20 mg daily as needed.  Recent restaging PET scan on 07/03/2023 continue to show no evidence of FDG avid disease.  It did show soft tissue lesion behind the right hip, stable with mild FDG activity, likely unrelated to his lung cancer diagnosis.  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  Patient is asymptomatic.  Will continue monitoring alone.  MRI of the brain on 07/20/2023 showed no evidence of intracranial metastatic disease.  On 12/26/2023, restaging CT chest, abdomen and pelvis showed stable disease, without evidence of disease progression.  Plan to continue current management.  Labs today reveal no dose-limiting toxicities.  Plan is to continue Lorlatinib  100 mg daily.    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  This will be continued every 4 weeks.  Dose given today.  Given persistent elevation in triglycerides, we increased atorvastatin  dose to 40 mg daily from July 2025.  Repeat lipid panel is pending from today.  RTC in 4 weeks for labs, follow-up and Xgeva  injection.  Next restaging scan will be obtained approximately 3 months from the last scan and we will obtain CT scans going forward.  This will be ordered on future visits.  Metastasis to bone [facility]) Previously elevated alkaline phosphatase likely due to bone disease. Discussed the use of Xgeva  to prevent fractures and strengthen bones. -Started Xgeva  injections from 01/27/23 and continue it monthly.  Dose given today. -Given persistent hypocalcemia previously, advised him to continue calcium  supplements at 1800 mg daily dose.  Hypertriglyceridemia Triglyceride levels increased from 205 to 429 as of June 2025, likely due to medication. LDL levels were stable at 888.  - Increased atorvastatin  to 40 mg daily from July 2025.   - Labs on last visit showed improved triglyceride level of 236.  Continue atorvastatin  at current dosing. - Monitor triglyceride levels.  - Consider additional medication if triglycerides continue to rise - Discussed potential side effects such as muscle pain or cramps  Peripheral neuropathy Persistent numbness in the bottom of the feet, likely a side effect of Lobrena, affecting approximately one-third to half of patients. No balance issues reported. Current symptoms do not warrant dosage adjustment. - Continue to monitor symptoms and adjust dosage only if side effects become debilitating.  Benign neoplasm of right hip (stable) The right hip tumor remains stable and benign. No further investigation is needed unless  changes occur. - Monitor for changes    I reviewed lab results and outside records for this visit and discussed relevant results with the patient. Diagnosis, plan of care and treatment options were also discussed in detail with the patient. Opportunity provided to ask questions and answers provided to his apparent satisfaction. Provided instructions to call our clinic with any problems, questions or concerns prior to return visit. I recommended to continue follow-up with PCP and sub-specialists. He verbalized understanding and agreed with the plan.   NCCN guidelines have been consulted in the planning of this patient's care.  I spent a total of 42 minutes during this encounter with the patient including review of chart and various tests results, discussions about plan of care and coordination of care plan.   Chinita Patten, MD  01/04/2024 4:57 PM  [facility] - A DEPT OF [facility] [address] [name] [address] Dept: [phone] Dept Fax: [phone]    CHIEF COMPLAINT/ REASON FOR VISIT:   Adenocarcinoma of left lung with metastasis to the bones.  Stage IV disease.  ALK mutation positive.  Current Treatment: Lorlatinib  100 mg p.o. daily starting from 01/14/2023.  INTERVAL HISTORY:    Discussed the use of AI scribe software for clinical note transcription with the patient, who gave verbal consent to proceed.   GARV KUECHLE is here today for repeat clinical assessment.  He started taking Lorlatinib  from 01/14/2023.   History of Present Illness  Ahad Colarusso is a 73  year old male with lung cancer who presents for follow-up regarding his cancer treatment and management.  His recent scan showed no new disease, and the left upper lobe lung nodule has remained stable at nine millimeters, reduced from an initial size of 3.9 centimeters. Some bone lesions have shown changes described as  sclerosis on imaging. Blood work is stable with normal white and red blood cell counts, and platelets are at 149,000, consistent with previous fluctuations. Alkaline phosphatase levels have decreased from a high of 482 last year to 84, which is within normal limits.  He is currently on 40 mg of Lipitor for cholesterol management. His triglycerides were 269 last month, down from a previous high of 429. He is not experiencing any unusual symptoms related to his cholesterol management.  He experiences persistent numbness in the bottom of his feet, described as feeling like 'something flat inside my shoe.' This numbness is constant and is a known side effect of his medication, Lobrena. No balance issues or trouble walking.  He is dealing with leg swelling.  He is considering participating in a cancer survivor program at the [facility], which involves a gradual exercise regimen tailored to individual capabilities. He is cautious about lifting heavy objects, typically using a dolly or tarp to move items over 100 pounds.   I have reviewed the past medical history, past surgical history, social history and family history with the patient and they are unchanged from previous note.  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 Additionally there were scattered sclerotic bone lesions diffusely along the skeleton, and numerous calcified lymph nodes throughout the  mediastinum and left hilum.    12/04/2022 PET scan   PET scan was notable for a 3.9 cm irregular spiculated mass in the left upper lobe, suspicious for bronchogenic carcinoma. 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  Whole body bone scan confirmed widespread osseous metastases. He denies any associated pain.    12/23/2022 Initial Diagnosis   Malignant neoplasm of upper lobe of left lung [facility]):  He has been under the care of his PCP for what was initially thought to be a pneumonia on CXR. Patient developed shortness of breath in September 2024 and was seen by his primary care physician.  At that time a chest x-ray was done which showed an infiltrate and question of atypical pneumonia.  The patient was treated with antibiotics however his symptoms did not improve.  A CT of the chest was ordered to further evaluate etiology for nonresolving dyspnea.   CT chest on 11/11/2022 which showed a spiculated nodule in the lingula with diffuse interstitial thickening in the left upper lobe that was concerning for an aggressive process or malignancy.  This led to PET/CT, pulmonology referral and additional evaluation.  Of note, patient recently had a melanoma in situ removed from his  face and a squamous cell skin cancer removed from his nose. Per his wife the melanoma was low grade and did not require any further treatment after excision.    12/23/2022 Procedure   Patient had initial consultation in pulmonology clinic on 12/17/2022 and underwent navigational bronchoscopy, EBUS and biopsy of left upper lobe lung nodule on 12/23/2022, performed by Dr. Malka.  On EBUS, hilar and mediastinal lymph node stations were examined but no lymph nodes were encountered.  This was suspected to be secondary to severe calcification noted on CT scan.   12/23/2022 Pathology  Results   Pathology from left upper lobe lung nodule biopsy came back positive for adenocarcinoma.  Brushings from left upper lobe and lavage showed rare atypical cells.   12/30/2022 Cancer Staging   Staging form: Lung, AJCC 8th Edition - Clinical stage from 12/30/2022: Stage IVB (cT2a, cN0, cM1c) - Signed by Autumn Millman, MD on 12/30/2022 Stage prefix: Initial diagnosis    Miscellaneous   Request submitted for Foundation Medicine testing on the specimen and Guardant 360 (liquid biopsy) for any actionable mutations and also for PD-L1 testing.  Treatment decisions to be made pending these results.   01/05/2023 Pathology Results   Foundation One CDx: ALK + EML4-ALK fusion (Variant 1)  PD-L1 TPS 5%  DNMT3A P904L mutation noted (unknown significance in his tumor type).    01/06/2023 -  Chemotherapy   Patient is on Treatment Plan : LUNG NSCLC Lorlatinib  q28d     03/31/2023 PET scan   IMPRESSION: 1. Resolution of hypermetabolic LEFT upper lobe pulmonary nodule. 2. Complete resolution of hypermetabolic activity within the skeleton. Underlying diffuse sclerotic skeletal disease remains consistent with treated metastasis. 3. No evidence of metastatic adenopathy or visceral metastasis. 4. Intense of focal activity posterior to the RIGHT femoral neck. This localizes to small focus of extra osseous soft tissue. This lesion is hypermetabolic on comparison exam with no interval change. Favor lesion unrelated to lung carcinoma. Indeterminate activity. Consider MRI of the RIGHT hip for further evaluation.   07/03/2023 PET scan   No evidence of FDG avid disease.  Stable soft tissue lesion behind right femoral neck, consider MRI for further evaluation.     On 07/20/2023, repeat MRI of the brain showed no evidence of intracranial metastatic disease. The previously demonstrated 2 mm focus of enhancement in the left parietal lobe periventricular white matter is not present on today's study. 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 Mild generalized cerebral atrophy.Known widespread sclerotic osseous metastatic disease.  On 09/25/2023, restaging CT chest abdomen and pelvis showed stable treated pulmonary neoplasm in the left upper lobe without CT evidence of recurrent disease. Extensive sclerotic osseous metastatic disease is similar prior examination. No new suspicious osseous lesions identified. No new or progressive findings in the chest, abdomen or pelvis. Nodular soft tissue posterior to the right femoral neck which demonstrates hypermetabolism on multiple prior PET-CT is stable and favored an indolent neoplasm. Consider follow-up right hip MRI with and without contrast.  On 12/26/2023, restaging CT chest, abdomen and pelvis showed stable disease, without evidence of disease progression.  REVIEW OF SYSTEMS:   Review of Systems - Oncology  All other pertinent systems were reviewed with the patient and are negative.  ALLERGIES: He has no allergies on file.  MEDICATIONS:  Current Outpatient Medications  Medication Sig Dispense Refill   acetaminophen  (TYLENOL ) 325 MG tablet 1 tablet as needed Orally every 6 hrs  atorvastatin  (LIPITOR) 40 MG tablet Take 1 tablet (40 mg total) by mouth at bedtime. 90 tablet 1   benzonatate (TESSALON) 200 MG capsule Take 200 mg by mouth 3 (three) times daily as needed for cough.     calcium  carbonate (SUPER CALCIUM ) 1500 (600 Ca) MG TABS tablet Take 600 mg of elemental calcium  by mouth 3 (three) times daily with meals.     cetirizine (ZYRTEC) 10 MG tablet 10 mg daily.     Cholecalciferol 125 MCG (5000 UT) capsule Take 5,000 Units by mouth daily.     clonazePAM  (KLONOPIN ) 1 MG tablet Take 1 mg by mouth at bedtime.     clopidogrel  (PLAVIX ) 75 MG tablet Take 75 mg by mouth daily.     denosumab  (XGEVA ) 120 MG/1.7ML SOLN injection Inject 120 mg into the skin.      diphenoxylate -atropine  (LOMOTIL ) 2.5-0.025 MG tablet Take 1 tablet by mouth 4 (four) times daily as needed for diarrhea or loose stools. 90 tablet 1   Ferrous Sulfate (IRON) 325 (65 FE) MG TABS Take 325 mg by mouth daily.     fluticasone (FLONASE) 50 MCG/ACT nasal spray Place 1 spray into both nostrils daily as needed for allergies.     furosemide  (LASIX ) 20 MG tablet Take 1 tablet (20 mg total) by mouth daily as needed for edema. 90 tablet 3   Ibuprofen 200 MG CAPS Take 1 capsule by mouth 3 (three) times daily as needed (pain).     latanoprost (XALATAN) 0.005 % ophthalmic solution Place 1 drop into both eyes at bedtime.     lorlatinib  (LORBRENA ) 100 MG tablet Take 1 tablet (100 mg total) by mouth daily. Swallow tablets whole. Do not chew, crush or split tablets. 28 tablet 5   MAGNESIUM PO Take 1 tablet by mouth daily.     Multiple Vitamin (MULTIVITAMIN) tablet Take 1 tablet by mouth daily.     omeprazole  (PRILOSEC) 20 MG capsule Take 20 mg by mouth daily.     ondansetron  (ZOFRAN -ODT) 8 MG disintegrating tablet Take 1 tablet (8 mg total) by mouth every 8 (eight) hours as needed for nausea or vomiting. 60 tablet 3   PREVIDENT 5000 BOOSTER PLUS 1.1 % PSTE Take 1 Application by mouth once as needed (Dry mouth).     traZODone (DESYREL) 50 MG tablet Take 25 mg by mouth at bedtime.     No current facility-administered medications for this visit.     VITALS:   Blood pressure 139/78, pulse 79, temperature 97.9 F (36.6 C), temperature source Temporal, resp. rate 16, weight 220 lb 8 oz (100 kg), SpO2 93%.  Wt Readings from Last 3 Encounters:  01/04/24 220 lb 8 oz (100 kg)  12/28/23 220 lb 1.6 oz (99.8 kg)  12/07/23 218 lb 6.4 oz (99.1 kg)    Body mass index is 29.09 kg/m.   Onc Performance Status - 01/04/24 1008       ECOG Perf Status   ECOG Perf Status Fully active, able to carry on all pre-disease performance without restriction      KPS SCALE   KPS % SCORE Normal, no compliants, no  evidence of disease            PHYSICAL EXAM:   Physical Exam Constitutional:      General: He is not in acute distress.    Appearance: Normal appearance.  HENT:     Head: Normocephalic and atraumatic.  Eyes:     General: No scleral icterus.    Conjunctiva/sclera:  Conjunctivae normal.  Cardiovascular:     Rate and Rhythm: Normal rate and regular rhythm.     Heart sounds: Normal heart sounds.  Pulmonary:     Effort: Pulmonary effort is normal.     Breath sounds: Normal breath sounds.  Abdominal:     General: There is no distension.  Musculoskeletal:     Right lower leg: Edema (1+) present.     Left lower leg: Edema (1+) present.  Lymphadenopathy:     Cervical: No cervical adenopathy.  Neurological:     General: No focal deficit present.     Mental Status: He is alert and oriented to person, place, and time.  Psychiatric:        Mood and Affect: Mood normal.        Behavior: Behavior normal.        Thought Content: Thought content normal.      LABORATORY DATA:   I have reviewed the data as listed.  Results for orders placed or performed in visit on 01/04/24  Lipid panel  Result Value Ref Range   Cholesterol 185 0 - 200 mg/dL   Triglycerides 651 (H) <150 mg/dL   HDL 31 (L) >59 mg/dL   Total CHOL/HDL Ratio 6.0 RATIO   VLDL 70 (H) 0 - 40 mg/dL   LDL Cholesterol 84 0 - 99 mg/dL  Magnesium  Result Value Ref Range   Magnesium 1.9 1.7 - 2.4 mg/dL  CMP (Cancer Center only)  Result Value Ref Range   Sodium 142 135 - 145 mmol/L   Potassium 4.7 3.5 - 5.1 mmol/L   Chloride 106 98 - 111 mmol/L   CO2 26 22 - 32 mmol/L   Glucose, Bld 172 (H) 70 - 99 mg/dL   BUN 25 (H) 8 - 23 mg/dL   Creatinine 8.84 9.38 - 1.24 mg/dL   Calcium  10.1 8.9 - 10.3 mg/dL   Total Protein 6.7 6.5 - 8.1 g/dL   Albumin 4.2 3.5 - 5.0 g/dL   AST 20 15 - 41 U/L   ALT 25 0 - 44 U/L   Alkaline Phosphatase 84 38 - 126 U/L   Total Bilirubin 0.4 0.0 - 1.2 mg/dL   GFR, Estimated >39 >39 mL/min    Anion gap 10 5 - 15  CBC with Differential (Cancer Center Only)  Result Value Ref Range   WBC Count 5.2 4.0 - 10.5 K/uL   RBC 4.73 4.22 - 5.81 MIL/uL   Hemoglobin 14.2 13.0 - 17.0 g/dL   HCT 57.4 60.9 - 47.9 %   MCV 89.9 80.0 - 100.0 fL   MCH 30.0 26.0 - 34.0 pg   MCHC 33.4 30.0 - 36.0 g/dL   RDW 85.9 88.4 - 84.4 %   Platelet Count 149 (L) 150 - 400 K/uL   nRBC 0.0 0.0 - 0.2 %   Neutrophils Relative % 69 %   Neutro Abs 3.6 1.7 - 7.7 K/uL   Lymphocytes Relative 12 %   Lymphs Abs 0.6 (L) 0.7 - 4.0 K/uL   Monocytes Relative 13 %   Monocytes Absolute 0.7 0.1 - 1.0 K/uL   Eosinophils Relative 5 %   Eosinophils Absolute 0.3 0.0 - 0.5 K/uL   Basophils Relative 0 %   Basophils Absolute 0.0 0.0 - 0.1 K/uL   Immature Granulocytes 1 %   Abs Immature Granulocytes 0.04 0.00 - 0.07 K/uL     RADIOGRAPHIC STUDIES:  CT CHEST ABDOMEN PELVIS W CONTRAST CLINICAL DATA:  Stage IV lung cancer.  Restaging. * Tracking Code: BO *  EXAM: CT CHEST, ABDOMEN, AND PELVIS WITH CONTRAST  TECHNIQUE: Multidetector CT imaging of the chest, abdomen and pelvis was performed following the standard protocol during bolus administration of intravenous contrast.  RADIATION DOSE REDUCTION: This exam was performed according to the departmental dose-optimization program which includes automated exposure control, adjustment of the mA and/or kV according to patient size and/or use of iterative reconstruction technique.  CONTRAST:  OMNIPAQUE  IOHEXOL  300 MG/ML  SOLN  COMPARISON:  09/25/2023  FINDINGS: CT CHEST FINDINGS  Cardiovascular: The heart size is normal. No substantial pericardial effusion.  Mediastinum/Nodes: Densely calcified mediastinal and hilar lymph nodes again noted. The esophagus has normal imaging features.  Lungs/Pleura: 9 mm spiculated left upper lobe pulmonary nodule seen previously is 8 mm today on 71/3. No new suspicious pulmonary nodule or mass. No focal airspace consolidation.  There is no evidence of pleural effusion.  Musculoskeletal: Diffuse sclerosis of bony anatomy is similar to prior.  CT ABDOMEN PELVIS FINDINGS  Hepatobiliary: Subtle tiny hypodensity in the medial segment left liver (58/2) is stable, likely benign. There is no evidence for gallstones, gallbladder wall thickening, or pericholecystic fluid. No intrahepatic or extrahepatic biliary dilation.  Pancreas: No focal mass lesion. No dilatation of the main duct. No intraparenchymal cyst. No peripancreatic edema.  Spleen: No splenomegaly. No suspicious focal mass lesion.  Adrenals/Urinary Tract: No adrenal nodule or mass. Stable 4.7 cm exophytic cyst left kidney. Tiny well-defined homogeneous low-density lesions in both kidneys are too small to characterize but are statistically most likely benign and probably cysts. No followup imaging is recommended. No evidence for hydroureter. The urinary bladder appears normal for the degree of distention.  Stomach/Bowel: Stomach is unremarkable. No gastric wall thickening. No evidence of outlet obstruction. Duodenum is normally positioned as is the ligament of Treitz. No small bowel wall thickening. No small bowel dilatation. The terminal ileum is normal. The appendix is not well visualized, but there is no edema or inflammation in the region of the cecal tip to suggest appendicitis. No gross colonic mass. No colonic wall thickening.  Vascular/Lymphatic: There is mild atherosclerotic calcification of the abdominal aorta without aneurysm. There is no gastrohepatic or hepatoduodenal ligament lymphadenopathy. No retroperitoneal or mesenteric lymphadenopathy. No pelvic sidewall lymphadenopathy.  Reproductive: Prostatomegaly.  Other: No intraperitoneal free fluid.  Musculoskeletal: Similar diffuse bony sclerosis.  IMPRESSION: 1. Stable exam. No new or progressive interval findings. 2. Stable 9 mm spiculated left upper lobe pulmonary nodule. 3.  Similar diffuse bony sclerosis. 4. Prostatomegaly. 5.  Aortic Atherosclerosis (ICD10-I70.0).  Electronically Signed   By: Camellia Candle M.D.   On: 01/02/2024 09:27    CODE STATUS:  Code Status History     Date Active Date Inactive Code Status Order ID Comments User Context   07/20/2014 0933 07/21/2014 1200 Full Code [id number]  Louis Shove, MD Inpatient      Advance Directive Documentation    Flowsheet Row Most Recent Value  Type of Advance Directive Healthcare Power of Attorney, Living will  Pre-existing out of facility DNR order (yellow form or pink MOST form) --  MOST Form in Place? --       No orders of the defined types were placed in this encounter.   Future Appointments  Date Time Provider Department Center  02/02/2024 10:00 AM [facility] PHLEBOTOMIST [facility] None  02/02/2024 10:30 AM Dietrich Ke, Chinita, MD [facility] None  02/02/2024 11:15 AM [facility] None     This document was completed utilizing speech  recognition software. Grammatical errors, random word insertions, pronoun errors, and incomplete sentences are an occasional consequence of this system due to software limitations, ambient noise, and hardware issues. Any formal questions or concerns about the content, text or information contained within the body of this dictation should be directly addressed to the provider for clarification.

## 2024-01-04 NOTE — Patient Instructions (Signed)
 Denosumab  Injection (Oncology) What is this medication? DENOSUMAB  (den oh SUE mab) prevents weakened bones caused by cancer. It may also be used to treat noncancerous bone tumors that cannot be removed by surgery. It can also be used to treat high calcium  levels in the blood caused by cancer. It works by blocking a protein that causes bones to break down quickly. This slows down the release of calcium  from bones, which lowers calcium  levels in your blood. It also makes your bones stronger and less likely to break (fracture). This medicine may be used for other purposes; ask your health care provider or pharmacist if you have questions. COMMON BRAND NAME(S): XGEVA  What should I tell my care team before I take this medication? They need to know if you have any of these conditions: Dental disease Having surgery or tooth extraction Infection Kidney disease Low levels of calcium  or vitamin D  in the blood Malnutrition On hemodialysis Skin conditions or sensitivity Thyroid  or parathyroid disease An unusual reaction to denosumab , other medications, foods, dyes, or preservatives Pregnant or trying to get pregnant Breast-feeding How should I use this medication? This medication is for injection under the skin. It is given by your care team in a hospital or clinic setting. A special MedGuide will be given to you before each treatment. Be sure to read this information carefully each time. Talk to your care team about the use of this medication in children. While it may be prescribed for children as young as 13 years for selected conditions, precautions do apply. Overdosage: If you think you have taken too much of this medicine contact a poison control center or emergency room at once. NOTE: This medicine is only for you. Do not share this medicine with others. What if I miss a dose? Keep appointments for follow-up doses. It is important not to miss your dose. Call your care team if you are unable to  keep an appointment. What may interact with this medication? Do not take this medication with any of the following: Other medications containing denosumab  This medication may also interact with the following: Medications that lower your chance of fighting infection Steroid medications, such as prednisone  or cortisone This list may not describe all possible interactions. Give your health care provider a list of all the medicines, herbs, non-prescription drugs, or dietary supplements you use. Also tell them if you smoke, drink alcohol, or use illegal drugs. Some items may interact with your medicine. What should I watch for while using this medication? Your condition will be monitored carefully while you are receiving this medication. You may need blood work while taking this medication. This medication may increase your risk of getting an infection. Call your care team for advice if you get a fever, chills, sore throat, or other symptoms of a cold or flu. Do not treat yourself. Try to avoid being around people who are sick. You should make sure you get enough calcium  and vitamin D  while you are taking this medication, unless your care team tells you not to. Discuss the foods you eat and the vitamins you take with your care team. Some people who take this medication have severe bone, joint, or muscle pain. This medication may also increase your risk for jaw problems or a broken thigh bone. Tell your care team right away if you have severe pain in your jaw, bones, joints, or muscles. Tell your care team if you have any pain that does not go away or that gets worse. Talk  to your care team if you may be pregnant. Serious birth defects can occur if you take this medication during pregnancy and for 5 months after the last dose. You will need a negative pregnancy test before starting this medication. Contraception is recommended while taking this medication and for 5 months after the last dose. Your care team  can help you find the option that works for you. What side effects may I notice from receiving this medication? Side effects that you should report to your care team as soon as possible: Allergic reactions--skin rash, itching, hives, swelling of the face, lips, tongue, or throat Bone, joint, or muscle pain Low calcium  level--muscle pain or cramps, confusion, tingling, or numbness in the hands or feet Osteonecrosis of the jaw--pain, swelling, or redness in the mouth, numbness of the jaw, poor healing after dental work, unusual discharge from the mouth, visible bones in the mouth Side effects that usually do not require medical attention (report to your care team if they continue or are bothersome): Cough Diarrhea Fatigue Headache Nausea This list may not describe all possible side effects. Call your doctor for medical advice about side effects. You may report side effects to FDA at 1-800-FDA-1088. Where should I keep my medication? This medication is given in a hospital or clinic. It will not be stored at home. NOTE: This sheet is a summary. It may not cover all possible information. If you have questions about this medicine, talk to your doctor, pharmacist, or health care provider.  2024 Elsevier/Gold Standard (2021-06-05 00:00:00)

## 2024-01-05 ENCOUNTER — Other Ambulatory Visit: Payer: Self-pay

## 2024-01-21 ENCOUNTER — Other Ambulatory Visit: Payer: Self-pay

## 2024-01-25 ENCOUNTER — Other Ambulatory Visit: Payer: Self-pay | Admitting: Pharmacy Technician

## 2024-01-25 NOTE — Progress Notes (Signed)
 Opened in error

## 2024-01-26 ENCOUNTER — Other Ambulatory Visit: Payer: Self-pay

## 2024-01-29 ENCOUNTER — Other Ambulatory Visit: Payer: Self-pay

## 2024-01-29 NOTE — Progress Notes (Signed)
 Specialty Pharmacy Refill Coordination Note  Gregg Guerrero is a 74 y.o. male contacted today regarding refills of specialty medication(s) Lorlatinib  (LORBRENA )   Patient requested Delivery   Delivery date: 02/03/24   Verified address: [address], [name], [name]   Medication will be filled on: 02/02/24

## 2024-02-02 ENCOUNTER — Telehealth: Payer: Self-pay | Admitting: Pharmacy Technician

## 2024-02-02 ENCOUNTER — Encounter: Payer: Self-pay | Admitting: Oncology

## 2024-02-02 ENCOUNTER — Inpatient Hospital Stay

## 2024-02-02 ENCOUNTER — Other Ambulatory Visit: Payer: Self-pay

## 2024-02-02 ENCOUNTER — Inpatient Hospital Stay: Admitting: Oncology

## 2024-02-02 ENCOUNTER — Inpatient Hospital Stay: Attending: Oncology

## 2024-02-02 VITALS — BP 135/81 | HR 73 | Temp 98.2°F | Resp 18 | Ht 73.0 in | Wt 224.7 lb

## 2024-02-02 DIAGNOSIS — R600 Localized edema: Secondary | ICD-10-CM

## 2024-02-02 DIAGNOSIS — E781 Pure hyperglyceridemia: Secondary | ICD-10-CM

## 2024-02-02 DIAGNOSIS — D367 Benign neoplasm of other specified sites: Secondary | ICD-10-CM | POA: Diagnosis not present

## 2024-02-02 DIAGNOSIS — C3412 Malignant neoplasm of upper lobe, left bronchus or lung: Secondary | ICD-10-CM | POA: Insufficient documentation

## 2024-02-02 DIAGNOSIS — Z79899 Other long term (current) drug therapy: Secondary | ICD-10-CM | POA: Insufficient documentation

## 2024-02-02 DIAGNOSIS — C7951 Secondary malignant neoplasm of bone: Secondary | ICD-10-CM | POA: Insufficient documentation

## 2024-02-02 LAB — CMP (CANCER CENTER ONLY)
ALT: 28 U/L (ref 0–44)
AST: 22 U/L (ref 15–41)
Albumin: 4.4 g/dL (ref 3.5–5.0)
Alkaline Phosphatase: 77 U/L (ref 38–126)
Anion gap: 11 (ref 5–15)
BUN: 21 mg/dL (ref 8–23)
CO2: 25 mmol/L (ref 22–32)
Calcium: 10.1 mg/dL (ref 8.9–10.3)
Chloride: 105 mmol/L (ref 98–111)
Creatinine: 0.96 mg/dL (ref 0.61–1.24)
GFR, Estimated: >60 mL/min
Glucose, Bld: 135 mg/dL — ABNORMAL HIGH (ref 70–99)
Potassium: 4.7 mmol/L (ref 3.5–5.1)
Sodium: 142 mmol/L (ref 135–145)
Total Bilirubin: 0.4 mg/dL (ref 0.0–1.2)
Total Protein: 7 g/dL (ref 6.5–8.1)

## 2024-02-02 LAB — CBC WITH DIFFERENTIAL (CANCER CENTER ONLY)
Abs Immature Granulocytes: 0.09 K/uL — ABNORMAL HIGH (ref 0.00–0.07)
Basophils Absolute: 0 K/uL (ref 0.0–0.1)
Basophils Relative: 1 %
Eosinophils Absolute: 0.3 K/uL (ref 0.0–0.5)
Eosinophils Relative: 5 %
HCT: 43.5 % (ref 39.0–52.0)
Hemoglobin: 14.4 g/dL (ref 13.0–17.0)
Immature Granulocytes: 2 %
Lymphocytes Relative: 12 %
Lymphs Abs: 0.6 K/uL — ABNORMAL LOW (ref 0.7–4.0)
MCH: 29.8 pg (ref 26.0–34.0)
MCHC: 33.1 g/dL (ref 30.0–36.0)
MCV: 90.1 fL (ref 80.0–100.0)
Monocytes Absolute: 0.7 K/uL (ref 0.1–1.0)
Monocytes Relative: 14 %
Neutro Abs: 3.5 K/uL (ref 1.7–7.7)
Neutrophils Relative %: 66 %
Platelet Count: 172 K/uL (ref 150–400)
RBC: 4.83 MIL/uL (ref 4.22–5.81)
RDW: 14 % (ref 11.5–15.5)
WBC Count: 5.3 K/uL (ref 4.0–10.5)
nRBC: 0 % (ref 0.0–0.2)

## 2024-02-02 LAB — MAGNESIUM: Magnesium: 2.3 mg/dL (ref 1.7–2.4)

## 2024-02-02 MED ORDER — DENOSUMAB 120 MG/1.7ML ~~LOC~~ SOLN
120.0000 mg | Freq: Once | SUBCUTANEOUS | Status: AC
  Administered 2024-02-02: 120 mg via SUBCUTANEOUS
  Filled 2024-02-02: qty 1.7

## 2024-02-02 NOTE — Progress Notes (Signed)
 "  [facility]  ONCOLOGY CLINIC PROGRESS NOTE   Patient Care Team: Cleotilde Planas, MD as PCP - General (Family Medicine) Revankar, Jennifer SAUNDERS, MD as PCP - Cardiology (Cardiology) Almetta Donnice LABOR, MD as PCP - Electrophysiology (Cardiology) Isadora Hose, MD as Consulting Physician (Pulmonary Disease) Prentis Duwaine BROCKS, RN as Oncology Nurse Navigator  PATIENT NAME: Gregg Guerrero   MR#: [id number] DOB: [birth year]  Date of visit: 02/02/2024  ASSESSMENT & PLAN:   CHORD TAKAHASHI is a 74 y.o. 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  Started Lorlatinib  from 01/14/2023.  Malignant neoplasm of upper lobe of left lung [facility]) Please review oncology history for additional details and timeline of events.  -Previously I discussed staging, diagnosis, prognosis, plan of care, treatment options with the patient and his wife.  Reviewed NCCN guidelines.  Discussed that all treatment options are palliative in nature and not curative intent, given stage IV disease.  They verbalized understanding.  - Ordered Foundation Medicine testing on the biopsy specimen to identify potential targetable mutations. 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  No other major actionable mutations.  -Given ALK mutation positivity, he would be candidate for targeted therapy.  Plan made to proceed with Lorlatinib  100 mg daily dosing, starting from 01/14/2023.  Side effects include but not limited to: increased blood pressure, edema, changes in electrolytes, increase in cholesterol and triglycerides, changes in LFTs, peripheral neuropathy,  CNS/mood changes, and rare but serious risk for AV block and ILD/pneumonitis.  EKG on 01/13/2023 showed normal sinus rhythm, normal QT interval of 372 ms.  This will be monitored periodically as needed.  -MRI of the brain performed on 01/01/2023 showed no definite evidence of intracranial metastatic disease noted.    -Patient has been compliant with Lorlatinib  and tolerating it very well.  Labs today reveal no dose-limiting toxicities.  Alkaline phosphatase decreased from 316 to 69, indicating a positive response to treatment. Calcium  levels now normal at 9.8.  -Given persistent hypocalcemia, I previously advised him to increase calcium  supplements to total of 1800 mg daily.  -Restaging PET scan on 03/31/2023 showed resolution of hypermetabolic left upper lobe pulmonary nodule.  Complete resolution of hypermetabolic activity within the skeleton.  No evidence of metastatic lymphadenopathy or visceral metastatic disease.  This is consistent with excellent response.  I previously reviewed the images with the patient, his wife and his family member who were accompanying.  They were understandably very happy.  After his recent trip to [place], patient developed swelling in both legs around ankles.  As per our instructions, he held Lorlatinib  from 05/14/2023 and resumed it from 05/18/2023.  He was started on Lasix  20 mg daily as needed.  Restaging PET scan on 07/03/2023 continue to show no evidence of FDG avid disease.  It did show soft tissue lesion behind the right hip, stable with mild FDG activity, likely unrelated to his lung cancer diagnosis.  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  Patient is asymptomatic.  Will continue monitoring alone.  MRI of the brain on 07/20/2023 showed no evidence of intracranial metastatic disease.  On 12/26/2023, restaging CT chest, abdomen and pelvis showed stable disease, without evidence of disease progression. Plan to  continue current management.  Labs today reveal no dose-limiting toxicities.  Plan is to continue Lorlatinib  100 mg daily.    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  This will be continued every 4 weeks.  Dose given today.  Given persistent elevation in triglycerides, we increased atorvastatin  dose to 40 mg daily from July 2025.  Repeat lipid panel is pending from today.  If triglycerides are consistently above 400, we will increase atorvastatin  dose or switch to an alternate medication.  RTC in 4 weeks for labs, follow-up and Xgeva  injection.  Next restaging scan will be obtained approximately 3 months from the last scan and we will obtain CT scans going forward.  This will be ordered on future visits.  Hypertriglyceridemia Triglyceride elevation is attributed to lorlatinib , with recent triglycerides at 348 mg/dL and LDL within acceptable range. He has been on an increased dose of atorvastatin  since July. He understands the medication-related fluctuations and is not concerned. - Monitored lipid panel; pending today's triglyceride value. - Continued current dose of atorvastatin . - Discussed potential for further dose adjustment or switch to rosuvastatin if triglycerides exceed 400 mg/dL.  Pedal edema Peripheral edema is well-managed with daily furosemide  and remains tolerable. - Continued daily furosemide . - Monitored for worsening edema or need for additional therapy. - Advise leg elevation during rest - Encourage use of compression stockings or Ace wrap - Advised reducing sodium intake  Benign neoplasm of right hip (stable) The right hip tumor remains stable and benign. No further investigation is needed unless changes occur. - Monitor for changes   I reviewed lab results and outside records for this visit and discussed relevant results with the patient. Diagnosis, plan of care and treatment options were also discussed in detail with the patient.  Opportunity provided to ask questions and answers provided to his apparent satisfaction. Provided instructions to call our clinic with any problems, questions or concerns prior to return visit. I recommended to continue follow-up with PCP and sub-specialists. He verbalized understanding and agreed with the plan.   NCCN guidelines have been consulted in the planning of this patients care.  I spent a total of 42 minutes during this encounter with the patient including review of chart and various tests results, discussions about plan of care and coordination of care plan.   Chinita Patten, MD  02/02/2024 1:07 PM  [facility] - A DEPT OF [facility] [address] [name] [address] Dept: [phone] Dept Fax: [phone]    CHIEF COMPLAINT/ REASON FOR VISIT:   Adenocarcinoma of left lung with metastasis to the bones.  Stage IV disease.  ALK mutation positive.  Current Treatment: Lorlatinib  100 mg p.o. daily starting from 01/14/2023.  INTERVAL HISTORY:    Discussed the use of AI scribe software for clinical note transcription with the patient, who gave verbal consent to proceed.   ROLLAN ROGER is here today for repeat clinical assessment.  He started taking Lorlatinib  from 01/14/2023.   History of Present Illness  Marquinn Meschke is a 74 year old male with non-small cell lung cancer of the left upper lobe on lorlatinib  who presents for routine oncology follow-up to monitor disease status and treatment-related effects.  He denies headaches, visual disturbances, nausea, vomiting, or appetite loss. He reports increased appetite, describing persistent hunger. He has not  experienced abnormal bleeding, bruising, weight loss, fatigue, fevers, chills, or night sweats.  He continues to experience peripheral edema involving the feet and legs, managed with daily morning furosemide . He denies chest pain or  dyspnea.  His triglyceride levels have fluctuated, with a recent value of 348 mg/dL; LDL remains within target range. Atorvastatin  dose was increased six months ago, with no recent adjustments to his lipid-lowering regimen.   I have reviewed the past medical history, past surgical history, social history and family history with the patient and they are unchanged from previous note.  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 Additionally there were scattered sclerotic bone lesions diffusely along the skeleton, and numerous calcified lymph nodes throughout the mediastinum and left hilum.    12/04/2022 PET scan   PET scan was notable for a 3.9 cm irregular spiculated mass in the left upper lobe, suspicious for bronchogenic carcinoma. 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  Whole body bone scan confirmed widespread osseous metastases. He denies any associated pain.    12/23/2022 Initial Diagnosis   Malignant neoplasm of upper lobe of left lung [facility]):  He has been under the care of his PCP for what was initially thought to be a pneumonia on CXR. Patient developed shortness of breath in September 2024 and was seen by his primary care physician.  At that time a chest x-ray was done which showed an  infiltrate and question of atypical pneumonia.  The patient was treated with antibiotics however his symptoms did not improve.  A CT of the chest was ordered to further evaluate etiology for nonresolving dyspnea.   CT chest on 11/11/2022 which showed a spiculated nodule in the lingula with diffuse interstitial thickening in the left upper lobe that was concerning for an aggressive process or malignancy.  This led to PET/CT, pulmonology referral and additional evaluation.  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 Per his wife the melanoma was low grade and did not require any further treatment after excision.    12/23/2022 Procedure   Patient had initial consultation in pulmonology clinic on 12/17/2022 and underwent navigational bronchoscopy, EBUS and biopsy of left upper lobe lung nodule on 12/23/2022, performed by Dr. Malka.  On EBUS, hilar and mediastinal lymph node stations were examined but no lymph nodes were encountered.  This was suspected to be secondary to severe calcification noted on CT scan.   12/23/2022 Pathology Results   Pathology from left upper lobe lung nodule biopsy came back positive for adenocarcinoma.  Brushings from left upper lobe and lavage showed rare atypical cells.   12/30/2022 Cancer Staging   Staging form: Lung, AJCC 8th Edition - Clinical stage from 12/30/2022: Stage IVB (cT2a, cN0, cM1c) - Signed by Autumn Millman, MD  on 12/30/2022 Stage prefix: Initial diagnosis    Miscellaneous   Request submitted for Foundation Medicine testing on the specimen and Guardant 360 (liquid biopsy) for any actionable mutations and also for PD-L1 testing.  Treatment decisions to be made pending these results.   01/05/2023 Pathology Results   Foundation One CDx: ALK + EML4-ALK fusion (Variant 1)  PD-L1 TPS 5%  DNMT3A P904L mutation noted (unknown significance in his tumor type).    01/06/2023 -   Chemotherapy   Patient is on Treatment Plan : LUNG NSCLC Lorlatinib  q28d     03/31/2023 PET scan   IMPRESSION: 1. Resolution of hypermetabolic LEFT upper lobe pulmonary nodule. 2. Complete resolution of hypermetabolic activity within the skeleton. Underlying diffuse sclerotic skeletal disease remains consistent with treated metastasis. 3. No evidence of metastatic adenopathy or visceral metastasis. 4. Intense of focal activity posterior to the RIGHT femoral neck. This localizes to small focus of extra osseous soft tissue. This lesion is hypermetabolic on comparison exam with no interval change. Favor lesion unrelated to lung carcinoma. Indeterminate activity. Consider MRI of the RIGHT hip for further evaluation.   07/03/2023 PET scan   No evidence of FDG avid disease.  Stable soft tissue lesion behind right femoral neck, consider MRI for further evaluation.     On 07/20/2023, repeat MRI of the brain showed no evidence of intracranial metastatic disease. The previously demonstrated 2 mm focus of enhancement in the left parietal lobe periventricular white matter is not present on today's study. 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 Mild generalized cerebral atrophy.Known widespread sclerotic osseous metastatic disease.  On 09/25/2023, restaging CT chest abdomen and pelvis showed stable treated pulmonary neoplasm in the left upper lobe without CT evidence of recurrent disease. Extensive sclerotic osseous metastatic disease is similar prior examination. No new suspicious osseous lesions identified. No new or progressive findings in the chest, abdomen or pelvis. Nodular soft tissue posterior to the right femoral neck which demonstrates hypermetabolism on multiple prior PET-CT is stable and favored an indolent neoplasm. Consider follow-up right hip MRI with and without contrast.  On 12/26/2023, restaging CT chest, abdomen  and pelvis showed stable disease, without evidence of disease progression.  REVIEW OF SYSTEMS:   Review of Systems - Oncology  All other pertinent systems were reviewed with the patient and are negative.  ALLERGIES: He has no allergies on file.  MEDICATIONS:  Current Outpatient Medications  Medication Sig Dispense Refill   acetaminophen  (TYLENOL ) 325 MG tablet 1 tablet as needed Orally every 6 hrs     atorvastatin  (LIPITOR) 40 MG tablet Take 1 tablet (40 mg total) by mouth at bedtime. 90 tablet 1   benzonatate (TESSALON) 200 MG capsule Take 200 mg by mouth 3 (three) times daily as needed for cough.     calcium  carbonate (SUPER CALCIUM ) 1500 (600 Ca) MG TABS tablet Take 600 mg of elemental calcium  by mouth 3 (three) times daily with meals.     cetirizine (ZYRTEC) 10 MG tablet 10 mg daily.     Cholecalciferol 125 MCG (5000 UT) capsule Take 5,000 Units by mouth daily.     clonazePAM  (KLONOPIN ) 1 MG tablet Take 1 mg by mouth at bedtime.     clopidogrel  (PLAVIX ) 75 MG tablet Take 75 mg by mouth daily.     denosumab  (XGEVA ) 120 MG/1.7ML SOLN injection Inject 120 mg into the skin.     diphenoxylate -atropine  (LOMOTIL ) 2.5-0.025 MG tablet Take 1 tablet by  mouth 4 (four) times daily as needed for diarrhea or loose stools. 90 tablet 1   Ferrous Sulfate (IRON) 325 (65 FE) MG TABS Take 325 mg by mouth daily.     fluticasone (FLONASE) 50 MCG/ACT nasal spray Place 1 spray into both nostrils daily as needed for allergies.     furosemide  (LASIX ) 20 MG tablet Take 1 tablet (20 mg total) by mouth daily as needed for edema. 90 tablet 3   Ibuprofen 200 MG CAPS Take 1 capsule by mouth 3 (three) times daily as needed (pain).     latanoprost (XALATAN) 0.005 % ophthalmic solution Place 1 drop into both eyes at bedtime.     lorlatinib  (LORBRENA ) 100 MG tablet Take 1 tablet (100 mg total) by mouth daily. Swallow tablets whole. Do not chew, crush or split tablets. 28 tablet 5   MAGNESIUM PO Take 1 tablet by mouth  daily.     Multiple Vitamin (MULTIVITAMIN) tablet Take 1 tablet by mouth daily.     omeprazole  (PRILOSEC) 20 MG capsule Take 20 mg by mouth daily.     ondansetron  (ZOFRAN -ODT) 8 MG disintegrating tablet Take 1 tablet (8 mg total) by mouth every 8 (eight) hours as needed for nausea or vomiting. 60 tablet 3   PREVIDENT 5000 BOOSTER PLUS 1.1 % PSTE Take 1 Application by mouth once as needed (Dry mouth).     traZODone (DESYREL) 50 MG tablet Take 25 mg by mouth at bedtime.     No current facility-administered medications for this visit.     VITALS:   Blood pressure 135/81, pulse 73, temperature 98.2 F (36.8 C), temperature source Temporal, resp. rate 18, height 6' 1 (1.854 m), weight 224 lb 11.2 oz (101.9 kg), SpO2 98%.  Wt Readings from Last 3 Encounters:  02/02/24 224 lb 11.2 oz (101.9 kg)  01/04/24 220 lb 8 oz (100 kg)  12/28/23 220 lb 1.6 oz (99.8 kg)    Body mass index is 29.65 kg/m.   Onc Performance Status - 02/02/24 1037       ECOG Perf Status   ECOG Perf Status Fully active, able to carry on all pre-disease performance without restriction      KPS SCALE   KPS % SCORE Normal, no compliants, no evidence of disease          PHYSICAL EXAM:   Physical Exam Constitutional:      General: He is not in acute distress.    Appearance: Normal appearance.  HENT:     Head: Normocephalic and atraumatic.  Eyes:     General: No scleral icterus.    Conjunctiva/sclera: Conjunctivae normal.  Cardiovascular:     Rate and Rhythm: Normal rate and regular rhythm.     Heart sounds: Normal heart sounds.  Pulmonary:     Effort: Pulmonary effort is normal.     Breath sounds: Normal breath sounds.  Abdominal:     General: There is no distension.  Musculoskeletal:     Right lower leg: Edema (1+) present.     Left lower leg: Edema (1+) present.  Lymphadenopathy:     Cervical: No cervical adenopathy.  Neurological:     General: No focal deficit present.     Mental Status: He is  alert and oriented to person, place, and time.  Psychiatric:        Mood and Affect: Mood normal.        Behavior: Behavior normal.        Thought Content: Thought content normal.  LABORATORY DATA:   I have reviewed the data as listed.  Results for orders placed or performed in visit on 02/02/24  Magnesium  Result Value Ref Range   Magnesium 2.3 1.7 - 2.4 mg/dL  CMP (Cancer Center only)  Result Value Ref Range   Sodium 142 135 - 145 mmol/L   Potassium 4.7 3.5 - 5.1 mmol/L   Chloride 105 98 - 111 mmol/L   CO2 25 22 - 32 mmol/L   Glucose, Bld 135 (H) 70 - 99 mg/dL   BUN 21 8 - 23 mg/dL   Creatinine 9.03 9.38 - 1.24 mg/dL   Calcium  10.1 8.9 - 10.3 mg/dL   Total Protein 7.0 6.5 - 8.1 g/dL   Albumin 4.4 3.5 - 5.0 g/dL   AST 22 15 - 41 U/L   ALT 28 0 - 44 U/L   Alkaline Phosphatase 77 38 - 126 U/L   Total Bilirubin 0.4 0.0 - 1.2 mg/dL   GFR, Estimated >39 >39 mL/min   Anion gap 11 5 - 15  CBC with Differential (Cancer Center Only)  Result Value Ref Range   WBC Count 5.3 4.0 - 10.5 K/uL   RBC 4.83 4.22 - 5.81 MIL/uL   Hemoglobin 14.4 13.0 - 17.0 g/dL   HCT 56.4 60.9 - 47.9 %   MCV 90.1 80.0 - 100.0 fL   MCH 29.8 26.0 - 34.0 pg   MCHC 33.1 30.0 - 36.0 g/dL   RDW 85.9 88.4 - 84.4 %   Platelet Count 172 150 - 400 K/uL   nRBC 0.0 0.0 - 0.2 %   Neutrophils Relative % 66 %   Neutro Abs 3.5 1.7 - 7.7 K/uL   Lymphocytes Relative 12 %   Lymphs Abs 0.6 (L) 0.7 - 4.0 K/uL   Monocytes Relative 14 %   Monocytes Absolute 0.7 0.1 - 1.0 K/uL   Eosinophils Relative 5 %   Eosinophils Absolute 0.3 0.0 - 0.5 K/uL   Basophils Relative 1 %   Basophils Absolute 0.0 0.0 - 0.1 K/uL   Immature Granulocytes 2 %   Abs Immature Granulocytes 0.09 (H) 0.00 - 0.07 K/uL     RADIOGRAPHIC STUDIES:  CT CHEST ABDOMEN PELVIS W CONTRAST CLINICAL DATA:  Stage IV lung cancer. Restaging. * Tracking Code: BO *  EXAM: CT CHEST, ABDOMEN, AND PELVIS WITH CONTRAST  TECHNIQUE: Multidetector CT  imaging of the chest, abdomen and pelvis was performed following the standard protocol during bolus administration of intravenous contrast.  RADIATION DOSE REDUCTION: This exam was performed according to the departmental dose-optimization program which includes automated exposure control, adjustment of the mA and/or kV according to patient size and/or use of iterative reconstruction technique.  CONTRAST:  OMNIPAQUE  IOHEXOL  300 MG/ML  SOLN  COMPARISON:  09/25/2023  FINDINGS: CT CHEST FINDINGS  Cardiovascular: The heart size is normal. No substantial pericardial effusion.  Mediastinum/Nodes: Densely calcified mediastinal and hilar lymph nodes again noted. The esophagus has normal imaging features.  Lungs/Pleura: 9 mm spiculated left upper lobe pulmonary nodule seen previously is 8 mm today on 71/3. No new suspicious pulmonary nodule or mass. No focal airspace consolidation. There is no evidence of pleural effusion.  Musculoskeletal: Diffuse sclerosis of bony anatomy is similar to prior.  CT ABDOMEN PELVIS FINDINGS  Hepatobiliary: Subtle tiny hypodensity in the medial segment left liver (58/2) is stable, likely benign. There is no evidence for gallstones, gallbladder wall thickening, or pericholecystic fluid. No intrahepatic or extrahepatic biliary dilation.  Pancreas: No focal mass lesion.  No dilatation of the main duct. No intraparenchymal cyst. No peripancreatic edema.  Spleen: No splenomegaly. No suspicious focal mass lesion.  Adrenals/Urinary Tract: No adrenal nodule or mass. Stable 4.7 cm exophytic cyst left kidney. Tiny well-defined homogeneous low-density lesions in both kidneys are too small to characterize but are statistically most likely benign and probably cysts. No followup imaging is recommended. No evidence for hydroureter. The urinary bladder appears normal for the degree of distention.  Stomach/Bowel: Stomach is unremarkable. No gastric wall  thickening. No evidence of outlet obstruction. Duodenum is normally positioned as is the ligament of Treitz. No small bowel wall thickening. No small bowel dilatation. The terminal ileum is normal. The appendix is not well visualized, but there is no edema or inflammation in the region of the cecal tip to suggest appendicitis. No gross colonic mass. No colonic wall thickening.  Vascular/Lymphatic: There is mild atherosclerotic calcification of the abdominal aorta without aneurysm. There is no gastrohepatic or hepatoduodenal ligament lymphadenopathy. No retroperitoneal or mesenteric lymphadenopathy. No pelvic sidewall lymphadenopathy.  Reproductive: Prostatomegaly.  Other: No intraperitoneal free fluid.  Musculoskeletal: Similar diffuse bony sclerosis.  IMPRESSION: 1. Stable exam. No new or progressive interval findings. 2. Stable 9 mm spiculated left upper lobe pulmonary nodule. 3. Similar diffuse bony sclerosis. 4. Prostatomegaly. 5.  Aortic Atherosclerosis (ICD10-I70.0).  Electronically Signed   By: Camellia Candle M.D.   On: 01/02/2024 09:27    CODE STATUS:  Code Status History     Date Active Date Inactive Code Status Order ID Comments User Context   07/20/2014 0933 07/21/2014 1200 Full Code [id number]  Louis Shove, MD Inpatient      Advance Directive Documentation    Flowsheet Row Most Recent Value  Type of Advance Directive Healthcare Power of Attorney, Living will  Pre-existing out of facility DNR order (yellow form or pink MOST form) --  MOST Form in Place? --       No orders of the defined types were placed in this encounter.   Future Appointments  Date Time Provider Department Center  03/01/2024  2:00 PM [facility] PHLEBOTOMIST [facility] None  03/01/2024  2:30 PM Jeffry Vogelsang, MD [facility] None  03/01/2024  3:00 PM [facility] INFUSION [facility] None     This document was completed utilizing speech recognition software. Grammatical errors, random word  insertions, pronoun errors, and incomplete sentences are an occasional consequence of this system due to software limitations, ambient noise, and hardware issues. Any formal questions or concerns about the content, text or information contained within the body of this dictation should be directly addressed to the provider for clarification.   "

## 2024-02-02 NOTE — Patient Instructions (Signed)
 Denosumab  Injection (Oncology) What is this medication? DENOSUMAB  (den oh SUE mab) prevents weakened bones caused by cancer. It may also be used to treat noncancerous bone tumors that cannot be removed by surgery. It can also be used to treat high calcium  levels in the blood caused by cancer. It works by blocking a protein that causes bones to break down quickly. This slows down the release of calcium  from bones, which lowers calcium  levels in your blood. It also makes your bones stronger and less likely to break (fracture). This medicine may be used for other purposes; ask your health care provider or pharmacist if you have questions. COMMON BRAND NAME(S): XGEVA  What should I tell my care team before I take this medication? They need to know if you have any of these conditions: Dental disease Having surgery or tooth extraction Infection Kidney disease Low levels of calcium  or vitamin D  in the blood Malnutrition On hemodialysis Skin conditions or sensitivity Thyroid  or parathyroid disease An unusual reaction to denosumab , other medications, foods, dyes, or preservatives Pregnant or trying to get pregnant Breast-feeding How should I use this medication? This medication is for injection under the skin. It is given by your care team in a hospital or clinic setting. A special MedGuide will be given to you before each treatment. Be sure to read this information carefully each time. Talk to your care team about the use of this medication in children. While it may be prescribed for children as young as 13 years for selected conditions, precautions do apply. Overdosage: If you think you have taken too much of this medicine contact a poison control center or emergency room at once. NOTE: This medicine is only for you. Do not share this medicine with others. What if I miss a dose? Keep appointments for follow-up doses. It is important not to miss your dose. Call your care team if you are unable to  keep an appointment. What may interact with this medication? Do not take this medication with any of the following: Other medications containing denosumab  This medication may also interact with the following: Medications that lower your chance of fighting infection Steroid medications, such as prednisone  or cortisone This list may not describe all possible interactions. Give your health care provider a list of all the medicines, herbs, non-prescription drugs, or dietary supplements you use. Also tell them if you smoke, drink alcohol, or use illegal drugs. Some items may interact with your medicine. What should I watch for while using this medication? Your condition will be monitored carefully while you are receiving this medication. You may need blood work while taking this medication. This medication may increase your risk of getting an infection. Call your care team for advice if you get a fever, chills, sore throat, or other symptoms of a cold or flu. Do not treat yourself. Try to avoid being around people who are sick. You should make sure you get enough calcium  and vitamin D  while you are taking this medication, unless your care team tells you not to. Discuss the foods you eat and the vitamins you take with your care team. Some people who take this medication have severe bone, joint, or muscle pain. This medication may also increase your risk for jaw problems or a broken thigh bone. Tell your care team right away if you have severe pain in your jaw, bones, joints, or muscles. Tell your care team if you have any pain that does not go away or that gets worse. Talk  to your care team if you may be pregnant. Serious birth defects can occur if you take this medication during pregnancy and for 5 months after the last dose. You will need a negative pregnancy test before starting this medication. Contraception is recommended while taking this medication and for 5 months after the last dose. Your care team  can help you find the option that works for you. What side effects may I notice from receiving this medication? Side effects that you should report to your care team as soon as possible: Allergic reactions--skin rash, itching, hives, swelling of the face, lips, tongue, or throat Bone, joint, or muscle pain Low calcium  level--muscle pain or cramps, confusion, tingling, or numbness in the hands or feet Osteonecrosis of the jaw--pain, swelling, or redness in the mouth, numbness of the jaw, poor healing after dental work, unusual discharge from the mouth, visible bones in the mouth Side effects that usually do not require medical attention (report to your care team if they continue or are bothersome): Cough Diarrhea Fatigue Headache Nausea This list may not describe all possible side effects. Call your doctor for medical advice about side effects. You may report side effects to FDA at 1-800-FDA-1088. Where should I keep my medication? This medication is given in a hospital or clinic. It will not be stored at home. NOTE: This sheet is a summary. It may not cover all possible information. If you have questions about this medicine, talk to your doctor, pharmacist, or health care provider.  2024 Elsevier/Gold Standard (2021-06-05 00:00:00)

## 2024-02-02 NOTE — Telephone Encounter (Signed)
 Oral Oncology Patient Advocate Encounter  Was successful in securing patient a grant from [employer] to provide copayment coverage for lorbrena .  This will keep the out of pocket expense at $0.    I have spoken with the patient.   The billing information is as follows and has been shared with [facility].    RxBin: [id number] PCN: [id number] Member ID: [id number] Group ID: [id number] Dates of Eligibility: 01/28/2024 through 01/26/2025  Fund:  [employer] Chet Burnet, CPhT  [facility], Zelda Salmon, [facility] - Oral Chemotherapy Patient Advocate Specialist III Phone: [phone]  Fax: [phone]

## 2024-02-02 NOTE — Assessment & Plan Note (Signed)
 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 He has been on an increased dose of atorvastatin  since July. He understands the medication-related fluctuations and is not concerned. - Monitored lipid panel; pending today's triglyceride value. - Continued current dose of atorvastatin . - Discussed potential for further dose adjustment or switch to rosuvastatin if triglycerides exceed 400 mg/dL.

## 2024-02-02 NOTE — Assessment & Plan Note (Signed)
 Peripheral edema is well-managed with daily furosemide  and remains tolerable. - Continued daily furosemide . - Monitored for worsening edema or need for additional therapy. - Advise leg elevation during rest - Encourage use of compression stockings or Ace wrap - Advised reducing sodium intake

## 2024-02-02 NOTE — Assessment & Plan Note (Addendum)
 Please review oncology history for additional details and timeline of events.  -Previously I discussed staging, diagnosis, prognosis, plan of care, treatment options with the patient and his wife.  Reviewed NCCN guidelines.  Discussed that all treatment options are palliative in nature and not curative intent, given stage IV disease.  They verbalized understanding.  - Ordered [facility] testing on the biopsy specimen to identify potential targetable mutations. 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  No other major actionable mutations.  -Given ALK mutation positivity, he would be candidate for targeted therapy.  Plan made to proceed with Lorlatinib  100 mg daily dosing, starting from 01/14/2023.  Side effects include but not limited to: increased blood pressure, edema, changes in electrolytes, increase in cholesterol and triglycerides, changes in LFTs, peripheral neuropathy, CNS/mood changes, and rare but serious risk for AV block and ILD/pneumonitis.  EKG on 01/13/2023 showed normal sinus rhythm, normal QT interval of 372 ms.  This will be monitored periodically as needed.  -MRI of the brain performed on 01/01/2023 showed no definite evidence of intracranial metastatic disease noted.    -Patient has been compliant with Lorlatinib  and tolerating it very well.  Labs today reveal no dose-limiting toxicities.  Alkaline phosphatase decreased from 316 to 69, indicating a positive response to treatment. Calcium  levels now normal at 9.8.  -Given persistent hypocalcemia, I previously advised him to increase calcium  supplements to total of 1800 mg daily.  -Restaging PET scan on 03/31/2023 showed resolution of hypermetabolic left upper lobe pulmonary nodule.  Complete resolution of hypermetabolic activity within the skeleton.  No evidence of metastatic lymphadenopathy or visceral  metastatic disease.  This is consistent with excellent response.  I previously reviewed the images with the patient, his wife and his family member who were accompanying.  They were understandably very happy.  After his recent trip to [place], patient developed swelling in both legs around ankles.  As per our instructions, he held Lorlatinib  from 05/14/2023 and resumed it from 05/18/2023.  He was started on Lasix  20 mg daily as needed.  Restaging PET scan on 07/03/2023 continue to show no evidence of FDG avid disease.  It did show soft tissue lesion behind the right hip, stable with mild FDG activity, likely unrelated to his lung cancer diagnosis.  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  Patient is asymptomatic.  Will continue monitoring alone.  MRI of the brain on 07/20/2023 showed no evidence of intracranial metastatic disease.  On 12/26/2023, restaging CT chest, abdomen and pelvis showed stable disease, without evidence of disease progression. Plan to continue current management.  Labs today reveal no dose-limiting toxicities.  Plan is to continue Lorlatinib  100 mg daily.    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  This will be continued every 4 weeks.  Dose given today.  Given persistent elevation in triglycerides, we increased atorvastatin  dose to 40 mg daily from July 2025.  Repeat lipid panel is pending from today.  If triglycerides are consistently above 400, we will increase atorvastatin  dose or switch to an alternate medication.  RTC in 4 weeks for labs, follow-up and Xgeva  injection.  Next restaging scan will be obtained approximately 3 months from the last scan and we will obtain CT scans going forward.  This will be ordered on future visits.

## 2024-02-03 ENCOUNTER — Other Ambulatory Visit: Payer: Self-pay

## 2024-02-03 LAB — LIPID PANEL
Cholesterol: 185 mg/dL (ref 0–200)
HDL: 32 mg/dL — ABNORMAL LOW
LDL Cholesterol: 102 mg/dL — ABNORMAL HIGH (ref 0–99)
Total CHOL/HDL Ratio: 5.8 ratio
Triglycerides: 254 mg/dL — ABNORMAL HIGH
VLDL: 51 mg/dL — ABNORMAL HIGH (ref 0–40)

## 2024-02-08 ENCOUNTER — Other Ambulatory Visit: Payer: Self-pay

## 2024-02-08 ENCOUNTER — Encounter: Payer: Self-pay | Admitting: Oncology

## 2024-02-09 ENCOUNTER — Encounter: Payer: Self-pay | Admitting: Oncology

## 2024-02-09 ENCOUNTER — Other Ambulatory Visit (HOSPITAL_COMMUNITY): Payer: Self-pay

## 2024-02-10 ENCOUNTER — Other Ambulatory Visit: Payer: Self-pay

## 2024-02-10 MED ORDER — OMEPRAZOLE 20 MG PO CPDR: 20.0000 mg | DELAYED_RELEASE_CAPSULE | Freq: Every day | ORAL | 3 refills | Status: AC

## 2024-02-18 ENCOUNTER — Other Ambulatory Visit: Payer: Self-pay

## 2024-02-25 ENCOUNTER — Other Ambulatory Visit: Payer: Self-pay

## 2024-02-25 ENCOUNTER — Other Ambulatory Visit: Payer: Self-pay | Admitting: Oncology

## 2024-02-25 DIAGNOSIS — C3412 Malignant neoplasm of upper lobe, left bronchus or lung: Secondary | ICD-10-CM

## 2024-02-25 MED ORDER — LORLATINIB 100 MG PO TABS
100.0000 mg | ORAL_TABLET | Freq: Every day | ORAL | 5 refills | Status: AC
  Filled 2024-02-26 – 2024-03-01 (×2): qty 30, 30d supply, fill #0

## 2024-02-26 ENCOUNTER — Other Ambulatory Visit: Payer: Self-pay

## 2024-02-29 ENCOUNTER — Other Ambulatory Visit (HOSPITAL_COMMUNITY): Payer: Self-pay

## 2024-03-01 ENCOUNTER — Other Ambulatory Visit: Payer: Self-pay

## 2024-03-01 ENCOUNTER — Inpatient Hospital Stay

## 2024-03-01 ENCOUNTER — Inpatient Hospital Stay: Attending: Oncology

## 2024-03-01 ENCOUNTER — Encounter: Payer: Self-pay | Admitting: Oncology

## 2024-03-01 ENCOUNTER — Other Ambulatory Visit (HOSPITAL_COMMUNITY): Payer: Self-pay

## 2024-03-01 ENCOUNTER — Inpatient Hospital Stay: Admitting: Oncology

## 2024-03-01 VITALS — BP 132/70 | HR 75 | Temp 98.4°F | Resp 15 | Wt 225.5 lb

## 2024-03-01 DIAGNOSIS — C7951 Secondary malignant neoplasm of bone: Secondary | ICD-10-CM

## 2024-03-01 DIAGNOSIS — C3412 Malignant neoplasm of upper lobe, left bronchus or lung: Secondary | ICD-10-CM

## 2024-03-01 LAB — COMPREHENSIVE METABOLIC PANEL WITH GFR
ALT: 28 U/L (ref 0–44)
AST: 22 U/L (ref 15–41)
Albumin: 4.2 g/dL (ref 3.5–5.0)
Alkaline Phosphatase: 85 U/L (ref 38–126)
Anion gap: 10 (ref 5–15)
BUN: 18 mg/dL (ref 8–23)
CO2: 26 mmol/L (ref 22–32)
Calcium: 9.4 mg/dL (ref 8.9–10.3)
Chloride: 107 mmol/L (ref 98–111)
Creatinine, Ser: 1.08 mg/dL (ref 0.61–1.24)
GFR, Estimated: >60 mL/min
Glucose, Bld: 130 mg/dL — ABNORMAL HIGH (ref 70–99)
Potassium: 4.9 mmol/L (ref 3.5–5.1)
Sodium: 142 mmol/L (ref 135–145)
Total Bilirubin: 0.3 mg/dL (ref 0.0–1.2)
Total Protein: 6.7 g/dL (ref 6.5–8.1)

## 2024-03-01 LAB — LDL CHOLESTEROL, DIRECT: Direct LDL: 82 mg/dL (ref 0–99)

## 2024-03-01 LAB — LIPID PANEL
Cholesterol: 162 mg/dL (ref 0–200)
HDL: 27 mg/dL — ABNORMAL LOW
LDL Cholesterol: UNDETERMINED mg/dL (ref 0–99)
Total CHOL/HDL Ratio: 6 ratio
Triglycerides: 480 mg/dL — ABNORMAL HIGH
VLDL: 96 mg/dL — ABNORMAL HIGH (ref 0–40)

## 2024-03-01 LAB — CBC WITH DIFFERENTIAL/PLATELET
Abs Immature Granulocytes: 0.02 10*3/uL (ref 0.00–0.07)
Basophils Absolute: 0 10*3/uL (ref 0.0–0.1)
Basophils Relative: 0 %
Eosinophils Absolute: 0.3 10*3/uL (ref 0.0–0.5)
Eosinophils Relative: 6 %
HCT: 43.3 % (ref 39.0–52.0)
Hemoglobin: 14.4 g/dL (ref 13.0–17.0)
Immature Granulocytes: 0 %
Lymphocytes Relative: 14 %
Lymphs Abs: 0.7 10*3/uL (ref 0.7–4.0)
MCH: 30.1 pg (ref 26.0–34.0)
MCHC: 33.3 g/dL (ref 30.0–36.0)
MCV: 90.4 fL (ref 80.0–100.0)
Monocytes Absolute: 0.5 10*3/uL (ref 0.1–1.0)
Monocytes Relative: 11 %
Neutro Abs: 3.2 10*3/uL (ref 1.7–7.7)
Neutrophils Relative %: 69 %
Platelets: 132 10*3/uL — ABNORMAL LOW (ref 150–400)
RBC: 4.79 MIL/uL (ref 4.22–5.81)
RDW: 13.9 % (ref 11.5–15.5)
WBC: 4.7 10*3/uL (ref 4.0–10.5)
nRBC: 0 % (ref 0.0–0.2)

## 2024-03-01 LAB — MAGNESIUM: Magnesium: 2.1 mg/dL (ref 1.7–2.4)

## 2024-03-01 MED ORDER — DENOSUMAB 120 MG/1.7ML ~~LOC~~ SOLN
120.0000 mg | Freq: Once | SUBCUTANEOUS | Status: AC
  Administered 2024-03-01: 120 mg via SUBCUTANEOUS
  Filled 2024-03-01: qty 1.7

## 2024-03-01 NOTE — Patient Instructions (Signed)
 Denosumab  Injection (Oncology) What is this medication? DENOSUMAB  (den oh SUE mab) prevents weakened bones caused by cancer. It may also be used to treat noncancerous bone tumors that cannot be removed by surgery. It can also be used to treat high calcium  levels in the blood caused by cancer. It works by blocking a protein that causes bones to break down quickly. This slows down the release of calcium  from bones, which lowers calcium  levels in your blood. It also makes your bones stronger and less likely to break (fracture). This medicine may be used for other purposes; ask your health care provider or pharmacist if you have questions. COMMON BRAND NAME(S): XGEVA  What should I tell my care team before I take this medication? They need to know if you have any of these conditions: Dental disease Having surgery or tooth extraction Infection Kidney disease Low levels of calcium  or vitamin D  in the blood Malnutrition On hemodialysis Skin conditions or sensitivity Thyroid  or parathyroid disease An unusual reaction to denosumab , other medications, foods, dyes, or preservatives Pregnant or trying to get pregnant Breast-feeding How should I use this medication? This medication is for injection under the skin. It is given by your care team in a hospital or clinic setting. A special MedGuide will be given to you before each treatment. Be sure to read this information carefully each time. Talk to your care team about the use of this medication in children. While it may be prescribed for children as young as 13 years for selected conditions, precautions do apply. Overdosage: If you think you have taken too much of this medicine contact a poison control center or emergency room at once. NOTE: This medicine is only for you. Do not share this medicine with others. What if I miss a dose? Keep appointments for follow-up doses. It is important not to miss your dose. Call your care team if you are unable to  keep an appointment. What may interact with this medication? Do not take this medication with any of the following: Other medications containing denosumab  This medication may also interact with the following: Medications that lower your chance of fighting infection Steroid medications, such as prednisone  or cortisone This list may not describe all possible interactions. Give your health care provider a list of all the medicines, herbs, non-prescription drugs, or dietary supplements you use. Also tell them if you smoke, drink alcohol, or use illegal drugs. Some items may interact with your medicine. What should I watch for while using this medication? Your condition will be monitored carefully while you are receiving this medication. You may need blood work while taking this medication. This medication may increase your risk of getting an infection. Call your care team for advice if you get a fever, chills, sore throat, or other symptoms of a cold or flu. Do not treat yourself. Try to avoid being around people who are sick. You should make sure you get enough calcium  and vitamin D  while you are taking this medication, unless your care team tells you not to. Discuss the foods you eat and the vitamins you take with your care team. Some people who take this medication have severe bone, joint, or muscle pain. This medication may also increase your risk for jaw problems or a broken thigh bone. Tell your care team right away if you have severe pain in your jaw, bones, joints, or muscles. Tell your care team if you have any pain that does not go away or that gets worse. Talk  to your care team if you may be pregnant. Serious birth defects can occur if you take this medication during pregnancy and for 5 months after the last dose. You will need a negative pregnancy test before starting this medication. Contraception is recommended while taking this medication and for 5 months after the last dose. Your care team  can help you find the option that works for you. What side effects may I notice from receiving this medication? Side effects that you should report to your care team as soon as possible: Allergic reactions--skin rash, itching, hives, swelling of the face, lips, tongue, or throat Bone, joint, or muscle pain Low calcium  level--muscle pain or cramps, confusion, tingling, or numbness in the hands or feet Osteonecrosis of the jaw--pain, swelling, or redness in the mouth, numbness of the jaw, poor healing after dental work, unusual discharge from the mouth, visible bones in the mouth Side effects that usually do not require medical attention (report to your care team if they continue or are bothersome): Cough Diarrhea Fatigue Headache Nausea This list may not describe all possible side effects. Call your doctor for medical advice about side effects. You may report side effects to FDA at 1-800-FDA-1088. Where should I keep my medication? This medication is given in a hospital or clinic. It will not be stored at home. NOTE: This sheet is a summary. It may not cover all possible information. If you have questions about this medicine, talk to your doctor, pharmacist, or health care provider.  2024 Elsevier/Gold Standard (2021-06-05 00:00:00)

## 2024-03-01 NOTE — Assessment & Plan Note (Signed)
 Please review oncology history for additional details and timeline of events.  -Previously I discussed staging, diagnosis, prognosis, plan of care, treatment options with the patient and his wife.  Reviewed NCCN guidelines.  Discussed that all treatment options are palliative in nature and not curative intent, given stage IV disease.  They verbalized understanding.  - Ordered Foundation Medicine testing on the biopsy specimen to identify potential targetable mutations. 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  No other major actionable mutations.  -Given ALK mutation positivity, he would be candidate for targeted therapy.  Plan made to proceed with Lorlatinib  100 mg daily dosing, starting from 01/14/2023.  Side effects include but not limited to: increased blood pressure, edema, changes in electrolytes, increase in cholesterol and triglycerides, changes in LFTs, peripheral neuropathy, CNS/mood changes, and rare but serious risk for AV block and ILD/pneumonitis.  EKG on 01/13/2023 showed normal sinus rhythm, normal QT interval of 372 ms.  This will be monitored periodically as needed.  -MRI of the brain performed on 01/01/2023 showed no definite evidence of intracranial metastatic disease noted.    -Patient has been compliant with Lorlatinib  and tolerating it very well.  Labs today reveal no dose-limiting toxicities.  Alkaline phosphatase decreased from 316 to 69, indicating a positive response to treatment. Calcium  levels now normal at 9.8.  -Given persistent hypocalcemia, I previously advised him to increase calcium  supplements to total of 1800 mg daily.  -Restaging PET scan on 03/31/2023 showed resolution of hypermetabolic left upper lobe pulmonary nodule.  Complete resolution of hypermetabolic activity within the skeleton.  No evidence of metastatic lymphadenopathy or visceral  metastatic disease.  This is consistent with excellent response.  I previously reviewed the images with the patient, his wife and his family member who were accompanying.  They were understandably very happy.  After his recent trip to [place], patient developed swelling in both legs around ankles.  As per our instructions, he held Lorlatinib  from 05/14/2023 and resumed it from 05/18/2023.  He was started on Lasix  20 mg daily as needed.  Restaging PET scan on 07/03/2023 continue to show no evidence of FDG avid disease.  It did show soft tissue lesion behind the right hip, stable with mild FDG activity, likely unrelated to his lung cancer diagnosis.  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  Patient is asymptomatic.  Will continue monitoring alone.  MRI of the brain on 07/20/2023 showed no evidence of intracranial metastatic disease.  On 12/26/2023, restaging CT chest, abdomen and pelvis showed stable disease, without evidence of disease progression. Plan to continue current management.  Labs today reveal no dose-limiting toxicities.  Plan is to continue Lorlatinib  100 mg daily.    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  This will be continued every 4 weeks.  Dose given today.  Given persistent elevation in triglycerides, we increased atorvastatin  dose to 40 mg daily from July 2025.  Triglycerides were within goal range on last visit.  Repeat lipid panel is pending from today.  If triglycerides are consistently above 400, we will increase atorvastatin  dose or switch to an alternate medication.  RTC in 4 weeks for labs, follow-up and Xgeva  injection.  Restaging CT scan requested to be done prior to return visit.

## 2024-03-02 ENCOUNTER — Other Ambulatory Visit: Payer: Self-pay

## 2024-03-02 NOTE — Progress Notes (Signed)
 Specialty Pharmacy Refill Coordination Note  Gregg Guerrero is a 74 y.o. male contacted today regarding refills of specialty medication(s) Lorlatinib  (LORBRENA )   Patient requested Delivery   Delivery date: 03/08/24   Verified address: [address], [name], [name]   Medication will be filled on: 03/07/24

## 2024-03-29 ENCOUNTER — Inpatient Hospital Stay

## 2024-03-29 ENCOUNTER — Inpatient Hospital Stay: Admitting: Oncology
